# Patient Record
Sex: Female | Born: 1995 | Race: Black or African American | Hispanic: No | Marital: Single | Smoking: Current some day smoker
Health system: Southern US, Community
[De-identification: ages and names within clinical notes are randomized; demographics above are authoritative.]

## PROBLEM LIST (undated history)

## (undated) ENCOUNTER — Inpatient Hospital Stay (HOSPITAL_COMMUNITY): Payer: Self-pay

## (undated) ENCOUNTER — Ambulatory Visit: Admission: EM | Payer: Medicaid Other | Source: Home / Self Care

## (undated) DIAGNOSIS — O24419 Gestational diabetes mellitus in pregnancy, unspecified control: Secondary | ICD-10-CM

## (undated) DIAGNOSIS — K219 Gastro-esophageal reflux disease without esophagitis: Secondary | ICD-10-CM

## (undated) HISTORY — PX: WISDOM TOOTH EXTRACTION: SHX21

## (undated) HISTORY — PX: TONSILLECTOMY: SUR1361

## (undated) HISTORY — PX: ADENOIDECTOMY: SUR15

---
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
## 2013-06-10 ENCOUNTER — Emergency Department (HOSPITAL_COMMUNITY)
Admission: EM | Admit: 2013-06-10 | Discharge: 2013-06-10 | Disposition: A | Discharge status: Home / Self Care | Payer: Medicaid Other | Attending: Emergency Medicine | Admitting: Emergency Medicine

## 2013-06-10 ENCOUNTER — Encounter (HOSPITAL_COMMUNITY): Payer: Self-pay | Admitting: Emergency Medicine

## 2013-06-10 ENCOUNTER — Emergency Department (HOSPITAL_COMMUNITY): Payer: Medicaid Other

## 2013-06-10 DIAGNOSIS — Z3490 Encounter for supervision of normal pregnancy, unspecified, unspecified trimester: Secondary | ICD-10-CM

## 2013-06-10 DIAGNOSIS — M549 Dorsalgia, unspecified: Secondary | ICD-10-CM | POA: Insufficient documentation

## 2013-06-10 DIAGNOSIS — Z3201 Encounter for pregnancy test, result positive: Secondary | ICD-10-CM | POA: Insufficient documentation

## 2013-06-10 LAB — POCT I-STAT, CHEM 8
Chloride: 106 mEq/L (ref 96–112)
Creatinine, Ser: 0.9 mg/dL (ref 0.50–1.10)
HCT: 40 % (ref 36.0–46.0)
Hemoglobin: 13.6 g/dL (ref 12.0–15.0)
Potassium: 3.8 mEq/L (ref 3.5–5.1)
Sodium: 137 mEq/L (ref 135–145)
TCO2: 21 mmol/L (ref 0–100)

## 2013-06-10 LAB — URINALYSIS, ROUTINE W REFLEX MICROSCOPIC
Ketones, ur: NEGATIVE mg/dL
Nitrite: NEGATIVE
Protein, ur: NEGATIVE mg/dL
Specific Gravity, Urine: 1.018 (ref 1.005–1.030)
Urobilinogen, UA: 0.2 mg/dL (ref 0.0–1.0)
pH: 7 (ref 5.0–8.0)

## 2013-06-10 LAB — HCG, QUANTITATIVE, PREGNANCY: hCG, Beta Chain, Quant, S: 21023 m[IU]/mL — ABNORMAL HIGH (ref <5)

## 2013-06-10 LAB — URINE MICROSCOPIC-ADD ON

## 2013-06-10 MED ORDER — PRENATAL VITAMINS 28-0.8 MG PO TABS: 1.0000 | ORAL_TABLET | Freq: Every day | ORAL | Status: DC

## 2013-06-10 NOTE — ED Notes (Signed)
Pt presents to department for evaluation of lower abdominal pain. Ongoing for several weeks. 6/10 pain at the time. No nausea/vomiting. Denies fever. Pt is alert and oriented x4. Denies urinary/vaginal symptoms.

## 2013-06-10 NOTE — ED Notes (Signed)
PA at bedside discussing results 

## 2013-06-10 NOTE — ED Provider Notes (Signed)
Medical screening examination/treatment/procedure(s) were performed by non-physician practitioner and as supervising physician I was immediately available for consultation/collaboration.  EKG Interpretation   None         Richardean Canal, MD 06/10/13 2215

## 2013-06-10 NOTE — ED Notes (Signed)
Contacted ultrasound about pt wait time. Pt is second on list. Pt made aware. Pt resting comfortably in bed.

## 2013-06-10 NOTE — ED Notes (Signed)
Pt reports generalized abdominal pain x 1 month with pain worse on left lower abdomen and back. Generalized pain on palpation. Denies N/V/D, SOB.

## 2013-06-10 NOTE — ED Provider Notes (Signed)
CSN: [id number]     Arrival date & time 06/10/13  1052 History   First MD Initiated Contact with Patient 06/10/13 1132     Chief Complaint  Patient presents with  . Abdominal Pain   (Consider location/radiation/quality/duration/timing/severity/associated sxs/prior Treatment) HPI Patient with intermittent abdominal pain and back pain for the past several weeks.  Pain is crampy, intermittent, comes and goes at random, no exacerbating or palliative factors.  Pain moves around, is usually located in the left abdomen and left back, but is occasionally on the right.  Denies fevers, chills, N/V/D, constipation, bloody stool, urinary symptoms, abnormal vaginal discharge or bleeding.  LMP end of October.   History reviewed. No pertinent past medical history. History reviewed. No pertinent past surgical history. No family history on file. History  Substance Use Topics  . Smoking status: Never Smoker   . Smokeless tobacco: Not on file  . Alcohol Use: No   OB History   Grav Para Term Preterm Abortions TAB SAB Ect Mult Living                 Review of Systems  Constitutional: Negative for fever and chills.  Respiratory: Negative for cough and shortness of breath.   Cardiovascular: Negative for chest pain.  Gastrointestinal: Positive for abdominal pain. Negative for nausea, vomiting and diarrhea.  Genitourinary: Negative for dysuria, urgency, frequency, vaginal bleeding and vaginal discharge.  Musculoskeletal: Positive for back pain.    Allergies  Review of patient's allergies indicates no known allergies.  Home Medications   Current Outpatient Rx  Name  Route  Sig  Dispense  Refill  . diphenhydrAMINE (BENADRYL) 25 MG tablet   Oral   Take 1 tablet (25 mg total) by mouth every 6 (six) hours.   20 tablet   0   . doxycycline (VIBRAMYCIN) 100 MG capsule   Oral   Take 1 capsule (100 mg total) by mouth 2 (two) times daily. One po bid x 7 days   14 capsule   0    BP 136/78  Pulse  74  Temp(Src) 98.2 F (36.8 C) (Oral)  Resp 20  Ht 5\' 2"  (1.575 m)  Wt 226 lb 1.6 oz (102.558 kg)  BMI 41.34 kg/m2  SpO2 99%  LMP 04/30/2013 Physical Exam  Nursing note and vitals reviewed. Constitutional: She appears well-developed and well-nourished. No distress.  HENT:  Head: Normocephalic and atraumatic.  Neck: Neck supple.  Cardiovascular: Normal rate and regular rhythm.   Pulmonary/Chest: Effort normal and breath sounds normal. No respiratory distress. She has no wheezes. She has no rales.  Abdominal: Soft. She exhibits no distension. There is tenderness in the right lower quadrant, suprapubic area, left upper quadrant and left lower quadrant. There is no rebound and no guarding.  Neurological: She is alert.  Skin: She is not diaphoretic.    ED Course  Procedures (including critical care time) Labs Review Labs Reviewed  HCG, QUANTITATIVE, PREGNANCY - Abnormal; Notable for the following:    hCG, Beta Chain, Quant, S 21023 (*)    All other components within normal limits  URINALYSIS, ROUTINE W REFLEX MICROSCOPIC - Abnormal; Notable for the following:    APPearance CLOUDY (*)    Leukocytes, UA SMALL (*)    All other components within normal limits  URINE MICROSCOPIC-ADD ON - Abnormal; Notable for the following:    Squamous Epithelial / LPF MANY (*)    Bacteria, UA FEW (*)    All other components within normal limits  POCT  PREGNANCY, URINE - Abnormal; Notable for the following:    Preg Test, Ur POSITIVE (*)    All other components within normal limits  POCT I-STAT, CHEM 8   Imaging Review US Ob Comp Less 14 Wks  06/10/2013   CLINICAL DATA:  Abdominal pain.  Positive pregnancy test.  EXAM: OBSTETRIC <14 WK [place] AND TRANSVAGINAL OB US  TECHNIQUE: Both transabdominal and transvaginal ultrasound examinations were performed for complete evaluation of the gestation as well as the maternal uterus, adnexal regions, and pelvic cul-de-sac. Transvaginal technique was performed to  assess early pregnancy.  COMPARISON:  None.  FINDINGS: Intrauterine gestational sac: Visualized/normal in shape.  Yolk sac:  Visualized  Embryo:  Visualized  Cardiac Activity: Visualized  Heart Rate:  100 bpm  CRL:   5  mm   6 w 1d                  [place] EDC: 02/02/14  Maternal uterus/adnexae: Right-sided paraovarian cyst incidentally noted, 3.0 x 2.8 x 2.1 cm.  IMPRESSION: Intrauterine gestational sac, yolk sac, and fetal pole with cardiac activity corresponding to 6 weeks 1 day by today's crown-rump length. This is concordant with assigned gestational age by LMP 6 weeks 5 days, EDC by LMP 01/29/2014. No acute abnormality.   Electronically Signed   By: Christiana Pellant M.D.   On: 06/10/2013 14:12   US Ob Transvaginal  06/10/2013   CLINICAL DATA:  Abdominal pain.  Positive pregnancy test.  EXAM: OBSTETRIC <14 WK [place] AND TRANSVAGINAL OB US  TECHNIQUE: Both transabdominal and transvaginal ultrasound examinations were performed for complete evaluation of the gestation as well as the maternal uterus, adnexal regions, and pelvic cul-de-sac. Transvaginal technique was performed to assess early pregnancy.  COMPARISON:  None.  FINDINGS: Intrauterine gestational sac: Visualized/normal in shape.  Yolk sac:  Visualized  Embryo:  Visualized  Cardiac Activity: Visualized  Heart Rate:  100 bpm  CRL:   5  mm   6 w 1d                  [place] EDC: 02/02/14  Maternal uterus/adnexae: Right-sided paraovarian cyst incidentally noted, 3.0 x 2.8 x 2.1 cm.  IMPRESSION: Intrauterine gestational sac, yolk sac, and fetal pole with cardiac activity corresponding to 6 weeks 1 day by today's crown-rump length. This is concordant with assigned gestational age by LMP 6 weeks 5 days, EDC by LMP 01/29/2014. No acute abnormality.   Electronically Signed   By: Christiana Pellant M.D.   On: 06/10/2013 14:12    EKG Interpretation   None       MDM   1. Pregnancy     Pt with intermittent back and abdominal pain x weeks, no associated symptoms.   Pregnancy test is positive.  LMP end of October, stated while mom was in the room.  Mom asked to leave for the rest of our discussions, which patient agreed she wanted.  US shows 6 week intrauterine gestation.  UA without obvious infection, chem 8 normal.  Abdominal exam is benign. Given history and exam doubt any other acute process at this time.  Pt d/c home with OB follow up.  Pt advised to follow safe practices with pregnancy- discussed vitamins, food limitations, medication safety.  Discussed result, findings, treatment, and follow up  with patient.  Pt given return precautions.  Pt verbalizes understanding and agrees with plan.        Trixie Dredge, PA-C 06/10/13 1555

## 2013-06-16 ENCOUNTER — Telehealth: Payer: Self-pay | Admitting: *Deleted

## 2013-06-16 NOTE — Telephone Encounter (Signed)
Message left on nurse voice mail from pt's mother. She stated that she would like a prescription for prenatal vitamins to be sent to her daughter's pharmacy. Her first Ob appt is 07/08/13. I returned the call and spoke with pt's mother. She stated that [name] had been given a prescription for the vitamins at her ED visit but her insurance did not cover the cost. They have purchased OTC prenatal vitamins. I stated that any prenatal vitamin is fine and she may continue taking what she has. Pt's mother voiced understanding.

## 2013-06-18 ENCOUNTER — Telehealth: Payer: Self-pay | Admitting: *Deleted

## 2013-06-18 NOTE — Telephone Encounter (Signed)
[name] called and left a message states she wants to speak to a nurse about her daughter about getting pain medicine for stomach pain and doesn't have an appointment until January 7.   Called and spoke with [facility] and she reports pain " all over her abdomen and back"=8.  States is the same as the pain she was having when she went to the ER on 06/10/13.  Reviewed chart and [place] and discussed with Dr. Macon Large Advised patient is likely round ligament pain due to stretching and  she may take either regular strength tylenol or extra strength tylenol . Advised not to exceed reccomended dosage on bottle and call us if this does not relieve her pain. Norelle voices understanding.

## 2013-07-02 ENCOUNTER — Emergency Department (HOSPITAL_COMMUNITY)
Admission: EM | Admit: 2013-07-02 | Discharge: 2013-07-02 | Discharge status: Against Medical Advice | Payer: Medicaid Other | Attending: Emergency Medicine | Admitting: Emergency Medicine

## 2013-07-02 ENCOUNTER — Encounter (HOSPITAL_COMMUNITY): Payer: Self-pay | Admitting: Emergency Medicine

## 2013-07-02 DIAGNOSIS — O26899 Other specified pregnancy related conditions, unspecified trimester: Secondary | ICD-10-CM

## 2013-07-02 DIAGNOSIS — R109 Unspecified abdominal pain: Secondary | ICD-10-CM

## 2013-07-02 DIAGNOSIS — O9989 Other specified diseases and conditions complicating pregnancy, childbirth and the puerperium: Secondary | ICD-10-CM | POA: Insufficient documentation

## 2013-07-02 DIAGNOSIS — R1084 Generalized abdominal pain: Secondary | ICD-10-CM | POA: Insufficient documentation

## 2013-07-02 LAB — URINALYSIS, ROUTINE W REFLEX MICROSCOPIC
Bilirubin Urine: NEGATIVE
Glucose, UA: NEGATIVE mg/dL
Hgb urine dipstick: NEGATIVE
KETONES UR: 15 mg/dL — AB
LEUKOCYTES UA: NEGATIVE
NITRITE: NEGATIVE
PH: 6 (ref 5.0–8.0)
Protein, ur: NEGATIVE mg/dL
SPECIFIC GRAVITY, URINE: 1.036 — AB (ref 1.005–1.030)
Urobilinogen, UA: 0.2 mg/dL (ref 0.0–1.0)

## 2013-07-02 LAB — POCT PREGNANCY, URINE: Preg Test, Ur: POSITIVE — AB

## 2013-07-02 NOTE — ED Notes (Signed)
Pt refused to stay and left AMA, refusing to sign and refusing vitals.

## 2013-07-02 NOTE — ED Provider Notes (Signed)
CSN: [id number]     Arrival date & time 07/02/13  1914 History   First MD Initiated Contact with Patient 07/02/13 2052     Chief Complaint  Patient presents with  . Abdominal Pain   (Consider location/radiation/quality/duration/timing/severity/associated sxs/prior Treatment) HPI Comments: Patient who is currently [redacted] weeks pregnant presents today with a chief complaint of abdominal pain.  She reports that the pain has been present for the past four weeks intermittently.  Pain is generalized and has been moving.  She describes the pain as a cramping pain.  She has been taking Extra Strength Tylenol for the pain, but does not feel that it is helping.  She was evaluated for the same on 06/10/13.  She had an OB ultrasound at that time, which showed an IUP that was 6 weeks 1 day.  She reports that she has not yet followed up with OB/GYN.  She states that she has an appointment scheduled next week.  She denies any vaginal discharge or vaginal discharge.  Denies fever or chills.  Denies nausea, vomiting, or diarrhea.  Denies urinary symptoms.    The history is provided by the patient.    History reviewed. No pertinent past medical history. History reviewed. No pertinent past surgical history. History reviewed. No pertinent family history. History  Substance Use Topics  . Smoking status: Never Smoker   . Smokeless tobacco: Not on file  . Alcohol Use: No   OB History   Grav Para Term Preterm Abortions TAB SAB Ect Mult Living   1              Review of Systems  All other systems reviewed and are negative.    Allergies  Review of patient's allergies indicates no known allergies.  Home Medications   Current Outpatient Rx  Name  Route  Sig  Dispense  Refill  . acetaminophen (TYLENOL) 500 MG tablet   Oral   Take 1,000 mg by mouth every 6 (six) hours as needed for moderate pain.         . Prenatal Vit-Fe Fumarate-FA (MULTIVITAMIN-PRENATAL) 27-0.8 MG TABS tablet   Oral   Take 1 tablet by  mouth daily at 12 noon.          BP 105/66  Pulse 70  Temp(Src) 97.6 F (36.4 C) (Oral)  Resp 22  Ht 5\' 2"  (1.575 m)  Wt 223 lb 5 oz (101.294 kg)  BMI 40.83 kg/m2  SpO2 100%  LMP 04/30/2013 Physical Exam  Nursing note and vitals reviewed. Constitutional: She appears well-developed and well-nourished. No distress.  HENT:  Head: Normocephalic and atraumatic.  Mouth/Throat: Oropharynx is clear and moist.  Neck: Normal range of motion. Neck supple.  Cardiovascular: Normal rate, regular rhythm and normal heart sounds.   Pulmonary/Chest: Effort normal and breath sounds normal.  Abdominal: Soft. Bowel sounds are normal. She exhibits no distension and no mass. There is tenderness. There is no rebound and no guarding.  Mild generalized tenderness  Musculoskeletal: Normal range of motion.  Neurological: She is alert.  Skin: Skin is warm and dry.  Psychiatric: She has a normal mood and affect.    ED Course  Procedures (including critical care time) Labs Review Labs Reviewed  URINALYSIS, ROUTINE W REFLEX MICROSCOPIC - Abnormal; Notable for the following:    APPearance CLOUDY (*)    Specific Gravity, Urine 1.036 (*)    Ketones, ur 15 (*)    All other components within normal limits  POCT PREGNANCY, URINE - Abnormal;  Notable for the following:    Preg Test, Ur POSITIVE (*)    All other components within normal limits  WET PREP, GENITAL  GC/CHLAMYDIA PROBE AMP  CBC WITH DIFFERENTIAL  COMPREHENSIVE METABOLIC PANEL   Imaging Review No results found.  EKG Interpretation   None       MDM  No diagnosis found. Patient who is currently [redacted] weeks pregnant presents today with a chief complaint of generalized abdominal pain that has been intermittent for the past 4 weeks.  Pain has been moving.  On exam patient with mild generalized tenderness.  No rebound or guarding.  Labs, UA, and pelvic exam ordered.  However, patient left AMA without notifying me prior to work up.  Patient has  follow up appointment with OB/GYN scheduled for next week.      Santiago Glad, PA-C 07/04/13 1642

## 2013-07-02 NOTE — ED Notes (Signed)
Presents with generalized abdominal pain ongoing since beginning of December. Pt is approximately [redacted] weeks pregnant. Denies nausea, vomitng and diarrhea. Told to use tylenol extra strength for pain. Medication is not helping. dnies vaginal discharge. Mother states, "something has got to give, the tylenol is not helping and [facility]

## 2013-07-02 NOTE — ED Notes (Addendum)
Pts POC-Preg postive (+). RN called and made aware of same.

## 2013-07-04 NOTE — ED Provider Notes (Signed)
Medical screening examination/treatment/procedure(s) were performed by non-physician practitioner and as supervising physician I was immediately available for consultation/collaboration.  EKG Interpretation   None         David H Yao, MD 07/04/13 2257 

## 2013-07-08 ENCOUNTER — Encounter: Payer: Self-pay | Admitting: Family Medicine

## 2013-07-08 ENCOUNTER — Ambulatory Visit (INDEPENDENT_AMBULATORY_CARE_PROVIDER_SITE_OTHER): Payer: Medicaid Other | Admitting: Family Medicine

## 2013-07-08 VITALS — BP 105/74 | Temp 97.3°F | Ht 61.0 in | Wt 218.0 lb

## 2013-07-08 DIAGNOSIS — Z23 Encounter for immunization: Secondary | ICD-10-CM

## 2013-07-08 DIAGNOSIS — Z3491 Encounter for supervision of normal pregnancy, unspecified, first trimester: Secondary | ICD-10-CM

## 2013-07-08 DIAGNOSIS — O0990 Supervision of high risk pregnancy, unspecified, unspecified trimester: Secondary | ICD-10-CM | POA: Insufficient documentation

## 2013-07-08 DIAGNOSIS — Z3400 Encounter for supervision of normal first pregnancy, unspecified trimester: Secondary | ICD-10-CM

## 2013-07-08 DIAGNOSIS — Z3401 Encounter for supervision of normal first pregnancy, first trimester: Secondary | ICD-10-CM

## 2013-07-08 LAB — POCT URINALYSIS DIP (DEVICE)
Bilirubin Urine: NEGATIVE
Glucose, UA: NEGATIVE mg/dL
Hgb urine dipstick: NEGATIVE
Ketones, ur: NEGATIVE mg/dL
LEUKOCYTES UA: NEGATIVE
NITRITE: NEGATIVE
Protein, ur: NEGATIVE mg/dL
Specific Gravity, Urine: 1.02 (ref 1.005–1.030)
Urobilinogen, UA: 0.2 mg/dL (ref 0.0–1.0)
pH: 6.5 (ref 5.0–8.0)

## 2013-07-08 LAB — HIV ANTIBODY (ROUTINE TESTING W REFLEX): HIV: NONREACTIVE

## 2013-07-08 LAB — GLUCOSE TOLERANCE, 1 HOUR (50G) W/O FASTING: GLUCOSE 1 HOUR GTT: 109 mg/dL (ref 70–140)

## 2013-07-08 MED ORDER — OMEPRAZOLE 20 MG PO CPDR: 20.0000 mg | DELAYED_RELEASE_CAPSULE | Freq: Two times a day (BID) | ORAL | Status: DC

## 2013-07-08 MED ORDER — CYCLOBENZAPRINE HCL 5 MG PO TABS: 5.0000 mg | ORAL_TABLET | Freq: Three times a day (TID) | ORAL | Status: DC | PRN

## 2013-07-08 NOTE — Progress Notes (Signed)
Subjective:    Felicia Holt is a G1P0 [redacted]w[redacted]d being seen today for her first obstetrical visit.  Her obstetrical history is significant for Obesity, teen pregnancy. Patient is undedcided but considering intend to breast feed. Pregnancy history fully reviewed.  Patient reports no bleeding, no contractions, no cramping, no leaking and pt complains of back pain, epigastric pain an dlower abdominal pain. pt has pain constantly but in different locations. Sometimes improves with eating, heat, no improvement with BM or APAP. Marland Kitchen  Filed Vitals:   07/08/13 1016 07/08/13 1017  BP: 105/74   Temp: 97.3 F (36.3 C)   Height:  5\' 1"  (1.549 m)  Weight: 98.884 kg (218 lb)     HISTORY: OB History  Gravida Para Term Preterm AB SAB TAB Ectopic Multiple Living  1             # Outcome Date GA Lbr Len/2nd Weight Sex Delivery Anes PTL Lv  1 CUR              History reviewed. No pertinent past medical history. Past Surgical History  Procedure Laterality Date  . Tonsillectomy    . Wisdom tooth extraction     Family History  Problem Relation Age of Onset  . Hypertension Mother   . Hypertension Father   . Diabetes Maternal Grandmother   . COPD Maternal Grandmother      Exam    Uterus:     Pelvic Exam:    Perineum: No Hemorrhoids   Vulva: normal   Vagina:  normal mucosa   pH:    Cervix: no CMT   Adnexa: normal adnexa and no mass, fullness, tenderness   Bony Pelvis: average  System: Breast:  No complaints   Skin: normal coloration and turgor, no rashes    Neurologic: oriented, normal, negative, normal mood   Extremities: normal strength, tone, and muscle mass, no deformities   HEENT extra ocular movement intact and sclera clear, anicteric   Mouth/Teeth mucous membranes moist, pharynx normal without lesions   Neck supple and no masses   Cardiovascular: regular rate and rhythm, no murmurs or gallops   Respiratory:  appears well, vitals normal, no respiratory distress, acyanotic,  normal RR, ear and throat exam is normal, neck free of mass or lymphadenopathy, chest clear, no wheezing, crepitations, rhonchi, normal symmetric air entry   Abdomen: soft, non-tender; bowel sounds normal; no masses,  no organomegaly epigastrict tendneres, tenderness over left hip, Tenderness paraspinal left, No CVAT   Urinary: urethral meatus normal      Assessment:    Pregnancy: G1P0 There are no active problems to display for this patient.       Plan:     Initial labs drawn. Prenatal vitamins. Problem list reviewed and updated. Genetic Screening discussed Quad Screen: requested.  Ultrasound discussed; fetal survey: requested.  Follow up in 4 weeks. 50% of 20 min visit spent on counseling and coordination of care.   Felicia Holt is a 18 y.o. G1P0 at [redacted]w[redacted]d by L=6 here for ROB visit. Discussed with Patient:  - New OB lab collected - RTC for any VB, regular, painful cramps/ctxs occurring at a rate of >2/10 min, fever (100.5 or higher), n/v/d, any pain that is unresolving or worsening. - Routine precautions(SAB, depression, infection s/s) - RTC in 4 weeks for next appt.  To Do: 1. Prenatal labs 2. Will schedule  3. Flexeril for pain, stretches for pain 4. prilosec for Heart burn.  [ ]  Vaccines: Flu: 07/08/2013  Tdap:  [ ]  BCM: undecided  Edu: [x ] PTL precautions; [ ]  BF class; [ ]  childbirth class; [ ]   BF counseling;    Felicia Holt 07/08/2013

## 2013-07-08 NOTE — Progress Notes (Signed)
P=70  Here for Initial OB visit. States not sure of exact date of LMP, but knows it was end of October. Had ER visit in December for abdominal pain, and found out was pregnant. Given new patient information. Discussed bmi/ appropriate weight gain.

## 2013-07-08 NOTE — Patient Instructions (Addendum)
Pregnancy - First Trimester  Your BMI is Body mass index is 41.21 kg/(m^2).Marland Kitchen The Academy of Obstetrics and Gynecology have recommendations for weight gain during pregnancy. Below is the recommendations. Please refer to your BMI and help maintain your weight.  BMI: Body mass index is 41.21 kg/(m^2).  Table 1. Institute of Medicine Weight Gain Recommendations for Pregnancy  Prepregnancy Weight  Category Body Mass Index* Recommended  Range of  Total Weight (lb) Recommended Rates of Weight Gain? in the Second and Third Trimesters (lb) (Mean Range [lb/wk])  Underweight Less than 18.5 28-40 1 (1-1.3)  Normal Weight 18.5-24.9 25-35 1 (0.8-1)  Overweight 25-29.9 15-25 0.6 (0.5-0.7)  Obese (includes all classes) 30 and greater 11-20 0.5 (0.4-0.6)  *Body mass index is calculated as weight in kilograms divided by height in meters squared or as weight in pounds multiplied by 703 divided by height in inches.  ?Calculations assume a 1.1-4.4 lb weight gain in the first trimester. Modified from Institute of Medicine ([place]). Weight gain during pregnancy: reexamining the guidelines. [place], [place]. [employer]; 2009. 2009 National Academy of Sciences.    AAFP guidelines    During sexual intercourse, millions of sperm go into the vagina. Only 1 sperm will penetrate and fertilize the female egg while it is in the Fallopian tube. One week later, the fertilized egg implants into the wall of the uterus. An embryo begins to develop into a baby. At 6 to 8 weeks, the eyes and face are formed and the heartbeat can be seen on ultrasound. At the end of 12 weeks (first trimester), all the baby's organs are formed. Now that you are pregnant, you will want to do everything you can to have a healthy baby. Two of the most important things are to get good prenatal care and follow your caregiver's instructions. Prenatal care is all the medical care you receive before the baby's birth. It is given to  prevent, find, and treat problems during the pregnancy and childbirth. PRENATAL EXAMS  During prenatal visits, your weight, blood pressure, and urine are checked. This is done to make sure you are healthy and progressing normally during the pregnancy.  A pregnant woman should gain 25 to 35 pounds during the pregnancy. However, if you are overweight or underweight, your caregiver will advise you regarding your weight.  Your caregiver will ask and answer questions for you.  Blood work, cervical cultures, other necessary tests, and a Pap test are done during your prenatal exams. These tests are done to check on your health and the probable health of your baby. Tests are strongly recommended and done for HIV with your permission. This is the virus that causes AIDS. These tests are done because medicines can be given to help prevent your baby from being born with this infection should you have been infected without knowing it. Blood work is also used to find out your blood type, previous infections, and follow your blood levels (hemoglobin).  Low hemoglobin (anemia) is common during pregnancy. Iron and vitamins are given to help prevent this. Later in the pregnancy, blood tests for diabetes will be done along with any other tests if any problems develop.  You may need other tests to make sure you and the baby are doing well. CHANGES DURING THE FIRST TRIMESTER  Your body goes through many changes during pregnancy. They vary from person to person. Talk to your caregiver about changes you notice and are concerned about. Changes can include:  Your menstrual period  stops.  The egg and sperm carry the genes that determine what you look like. Genes from you and your partner are forming a baby. The female genes determine whether the baby is a boy or a girl.  Your body increases in girth and you may feel bloated.  Feeling sick to your stomach (nauseous) and throwing up (vomiting). If the vomiting is  uncontrollable, call your caregiver.  Your breasts will begin to enlarge and become tender.  Your nipples may stick out more and become darker.  The need to urinate more. Painful urination may mean you have a bladder infection.  Tiring easily.  Loss of appetite.  Cravings for certain kinds of food.  At first, you may gain or lose a couple of pounds.  You may have changes in your emotions from day to day (excited to be pregnant or concerned something may go wrong with the pregnancy and baby).  You may have more vivid and strange dreams. HOME CARE INSTRUCTIONS   It is very important to avoid all smoking, alcohol and non-prescribed drugs during your pregnancy. These affect the formation and growth of the baby. Avoid chemicals while pregnant to ensure the delivery of a healthy infant.  Start your prenatal visits by the 12th week of pregnancy. They are usually scheduled monthly at first, then more often in the last 2 months before delivery. Keep your caregiver's appointments. Follow your caregiver's instructions regarding medicine use, blood and lab tests, exercise, and diet.  During pregnancy, you are providing food for you and your baby. Eat regular, well-balanced meals. Choose foods such as meat, fish, milk and other low fat dairy products, vegetables, fruits, and whole-grain breads and cereals. Your caregiver will tell you of the ideal weight gain.  You can help morning sickness by keeping soda crackers at the bedside. Eat a couple before arising in the morning. You may want to use the crackers without salt on them.  Eating 4 to 5 small meals rather than 3 large meals a day also may help the nausea and vomiting.  Drinking liquids between meals instead of during meals also seems to help nausea and vomiting.  A physical sexual relationship may be continued throughout pregnancy if there are no other problems. Problems may be early (premature) leaking of amniotic fluid from the membranes,  vaginal bleeding, or belly (abdominal) pain.  Exercise regularly if there are no restrictions. Check with your caregiver or physical therapist if you are unsure of the safety of some of your exercises. Greater weight gain will occur in the last 2 trimesters of pregnancy. Exercising will help:  Control your weight.  Keep you in shape.  Prepare you for labor and delivery.  Help you lose your pregnancy weight after you deliver your baby.  Wear a good support or jogging bra for breast tenderness during pregnancy. This may help if worn during sleep too.  Ask when prenatal classes are available. Begin classes when they are offered.  Do not use hot tubs, steam rooms, or saunas.  Wear your seat belt when driving. This protects you and your baby if you are in an accident.  Avoid raw meat, uncooked cheese, cat litter boxes, and soil used by cats throughout the pregnancy. These carry germs that can cause birth defects in the baby.  The first trimester is a good time to visit your dentist for your dental health. Getting your teeth cleaned is okay. Use a softer toothbrush and brush gently during pregnancy.  Ask for help if  you have financial, counseling, or nutritional needs during pregnancy. Your caregiver will be able to offer counseling for these needs as well as refer you for other special needs.  Do not take any medicines or herbs unless told by your caregiver.  Inform your caregiver if there is any mental or physical domestic violence.  Make a list of emergency phone numbers of family, friends, hospital, and police and fire departments.  Write down your questions. Take them to your prenatal visit.  Do not douche.  Do not cross your legs.  If you have to stand for long periods of time, rotate you feet or take small steps in a circle.  You may have more vaginal secretions that may require a sanitary pad. Do not use tampons or scented sanitary pads. MEDICINES AND DRUG USE IN  PREGNANCY  Take prenatal vitamins as directed. The vitamin should contain 1 milligram of folic acid. Keep all vitamins out of reach of children. Only a couple vitamins or tablets containing iron may be fatal to a baby or young child when ingested.  Avoid use of all medicines, including herbs, over-the-counter medicines, not prescribed or suggested by your caregiver. Only take over-the-counter or prescription medicines for pain, discomfort, or fever as directed by your caregiver. Do not use aspirin, ibuprofen, or naproxen unless directed by your caregiver.  Let your caregiver also know about herbs you may be using.  Alcohol is related to a number of birth defects. This includes fetal alcohol syndrome. All alcohol, in any form, should be avoided completely. Smoking will cause low birth rate and premature babies.  Street or illegal drugs are very harmful to the baby. They are absolutely forbidden. A baby born to an addicted mother will be addicted at birth. The baby will go through the same withdrawal an adult does.  Let your caregiver know about any medicines that you have to take and for what reason you take them. SEEK MEDICAL CARE IF:  You have any concerns or worries during your pregnancy. It is better to call with your questions if you feel they cannot wait, rather than worry about them. SEEK IMMEDIATE MEDICAL CARE IF:   An unexplained oral temperature above 102 F (38.9 C) develops, or as your caregiver suggests.  You have leaking of fluid from the vagina (birth canal). If leaking membranes are suspected, take your temperature and inform your caregiver of this when you call.  There is vaginal spotting or bleeding. Notify your caregiver of the amount and how many pads are used.  You develop a bad smelling vaginal discharge with a change in the color.  You continue to feel sick to your stomach (nauseated) and have no relief from remedies suggested. You vomit blood or coffee ground-like  materials.  You lose more than 2 pounds of weight in 1 week.  You gain more than 2 pounds of weight in 1 week and you notice swelling of your face, hands, feet, or legs.  You gain 5 pounds or more in 1 week (even if you do not have swelling of your hands, face, legs, or feet).  You get exposed to [place] measles and have never had them.  You are exposed to fifth disease or chickenpox.  You develop belly (abdominal) pain. Round ligament discomfort is a common non-cancerous (benign) cause of abdominal pain in pregnancy. Your caregiver still must evaluate this.  You develop headache, fever, diarrhea, pain with urination, or shortness of breath.  You fall or are in a car  accident or have any kind of trauma.  There is mental or physical violence in your home. Document Released: 06/12/2001 Document Revised: 03/12/2012 Document Reviewed: 12/14/2008 [facility] Patient Information 2014 [place], [place]. Back Exercises These exercises may help you when beginning to rehabilitate your injury. Your symptoms may resolve with or without further involvement from your physician, physical therapist or athletic trainer. While completing these exercises, remember:   Restoring tissue flexibility helps normal motion to return to the joints. This allows healthier, less painful movement and activity.  An effective stretch should be held for at least 30 seconds.  A stretch should never be painful. You should only feel a gentle lengthening or release in the stretched tissue. STRETCH  Extension, Prone on Elbows   Lie on your stomach on the floor, a bed will be too soft. Place your palms about shoulder width apart and at the height of your head.  Place your elbows under your shoulders. If this is too painful, stack pillows under your chest.  Allow your body to relax so that your hips drop lower and make contact more completely with the floor.  Hold this position for __________ seconds.  Slowly return to lying  flat on the floor. Repeat __________ times. Complete this exercise __________ times per day.  RANGE OF MOTION  Extension, Prone Press Ups   Lie on your stomach on the floor, a bed will be too soft. Place your palms about shoulder width apart and at the height of your head.  Keeping your back as relaxed as possible, slowly straighten your elbows while keeping your hips on the floor. You may adjust the placement of your hands to maximize your comfort. As you gain motion, your hands will come more underneath your shoulders.  Hold this position __________ seconds.  Slowly return to lying flat on the floor. Repeat __________ times. Complete this exercise __________ times per day.  RANGE OF MOTION- Quadruped, Neutral Spine   Assume a hands and knees position on a firm surface. Keep your hands under your shoulders and your knees under your hips. You may place padding under your knees for comfort.  Drop your head and point your tail bone toward the ground below you. This will round out your low back like an angry cat. Hold this position for __________ seconds.  Slowly lift your head and release your tail bone so that your back sags into a large arch, like an old horse.  Hold this position for __________ seconds.  Repeat this until you feel limber in your low back.  Now, find your "sweet spot." This will be the most comfortable position somewhere between the two previous positions. This is your neutral spine. Once you have found this position, tense your stomach muscles to support your low back.  Hold this position for __________ seconds. Repeat __________ times. Complete this exercise __________ times per day.  STRETCH  Flexion, Single Knee to Chest   Lie on a firm bed or floor with both legs extended in front of you.  Keeping one leg in contact with the floor, bring your opposite knee to your chest. Hold your leg in place by either grabbing behind your thigh or at your knee.  Pull until  you feel a gentle stretch in your low back. Hold __________ seconds.  Slowly release your grasp and repeat the exercise with the opposite side. Repeat __________ times. Complete this exercise __________ times per day.  STRETCH - Hamstrings, Standing  Stand or sit and extend your  right / left leg, placing your foot on a chair or foot stool  Keeping a slight arch in your low back and your hips straight forward.  Lead with your chest and lean forward at the waist until you feel a gentle stretch in the back of your right / left knee or thigh. (When done correctly, this exercise requires leaning only a small distance.)  Hold this position for __________ seconds. Repeat __________ times. Complete this stretch __________ times per day. STRENGTHENING  Deep Abdominals, Pelvic Tilt   Lie on a firm bed or floor. Keeping your legs in front of you, bend your knees so they are both pointed toward the ceiling and your feet are flat on the floor.  Tense your lower abdominal muscles to press your low back into the floor. This motion will rotate your pelvis so that your tail bone is scooping upwards rather than pointing at your feet or into the floor.  With a gentle tension and even breathing, hold this position for __________ seconds. Repeat __________ times. Complete this exercise __________ times per day.  STRENGTHENING  Abdominals, Crunches   Lie on a firm bed or floor. Keeping your legs in front of you, bend your knees so they are both pointed toward the ceiling and your feet are flat on the floor. Cross your arms over your chest.  Slightly tip your chin down without bending your neck.  Tense your abdominals and slowly lift your trunk high enough to just clear your shoulder blades. Lifting higher can put excessive stress on the low back and does not further strengthen your abdominal muscles.  Control your return to the starting position. Repeat __________ times. Complete this exercise __________  times per day.  STRENGTHENING  Quadruped, Opposite UE/LE Lift   Assume a hands and knees position on a firm surface. Keep your hands under your shoulders and your knees under your hips. You may place padding under your knees for comfort.  Find your neutral spine and gently tense your abdominal muscles so that you can maintain this position. Your shoulders and hips should form a rectangle that is parallel with the floor and is not twisted.  Keeping your trunk steady, lift your right hand no higher than your shoulder and then your left leg no higher than your hip. Make sure you are not holding your breath. Hold this position __________ seconds.  Continuing to keep your abdominal muscles tense and your back steady, slowly return to your starting position. Repeat with the opposite arm and leg. Repeat __________ times. Complete this exercise __________ times per day. Document Released: 07/06/2005 Document Revised: 09/10/2011 Document Reviewed: 09/30/2008 [facility] Patient Information 2014 [place], [place].

## 2013-07-09 LAB — OBSTETRIC PANEL
Antibody Screen: NEGATIVE
BASOS ABS: 0 10*3/uL (ref 0.0–0.1)
Basophils Relative: 0 % (ref 0–1)
Eosinophils Absolute: 0.3 10*3/uL (ref 0.0–0.7)
Eosinophils Relative: 3 % (ref 0–5)
HCT: 34.7 % — ABNORMAL LOW (ref 36.0–46.0)
HEMOGLOBIN: 12.4 g/dL (ref 12.0–15.0)
Hepatitis B Surface Ag: NEGATIVE
Lymphocytes Relative: 22 % (ref 12–46)
Lymphs Abs: 2 10*3/uL (ref 0.7–4.0)
MCH: 28.8 pg (ref 26.0–34.0)
MCHC: 35.7 g/dL (ref 30.0–36.0)
MCV: 80.5 fL (ref 78.0–100.0)
Monocytes Absolute: 0.5 10*3/uL (ref 0.1–1.0)
Monocytes Relative: 5 % (ref 3–12)
NEUTROS ABS: 6 10*3/uL (ref 1.7–7.7)
NEUTROS PCT: 70 % (ref 43–77)
PLATELETS: 295 10*3/uL (ref 150–400)
RBC: 4.31 MIL/uL (ref 3.87–5.11)
RDW: 14 % (ref 11.5–15.5)
Rh Type: POSITIVE
Rubella: 0.56 Index (ref <0.90)
WBC: 8.7 10*3/uL (ref 4.0–10.5)

## 2013-07-09 LAB — GC/CHLAMYDIA PROBE AMP
CT PROBE, AMP APTIMA: NEGATIVE
GC Probe RNA: NEGATIVE

## 2013-07-10 LAB — PRESCRIPTION MONITORING PROFILE (19 PANEL)
AMPHETAMINE/METH: NEGATIVE ng/mL
BARBITURATE SCREEN, URINE: NEGATIVE ng/mL
BENZODIAZEPINE SCREEN, URINE: NEGATIVE ng/mL
Buprenorphine, Urine: NEGATIVE ng/mL
COCAINE METABOLITES: NEGATIVE ng/mL
Carisoprodol, Urine: NEGATIVE ng/mL
Creatinine, Urine: 216.13 mg/dL (ref >20.0)
FENTANYL URINE: NEGATIVE ng/mL
MDMA URINE: NEGATIVE ng/mL
Meperidine, Ur: NEGATIVE ng/mL
Methadone Screen, Urine: NEGATIVE ng/mL
Methaqualone: NEGATIVE ng/mL
Nitrites, Initial: NEGATIVE ug/mL
OPIATE SCREEN, URINE: NEGATIVE ng/mL
Oxycodone Screen, Ur: NEGATIVE ng/mL
PHENCYCLIDINE, UR: NEGATIVE ng/mL
Propoxyphene: NEGATIVE ng/mL
TAPENTADOLUR: NEGATIVE ng/mL
TRAMADOL UR: NEGATIVE ng/mL
Zolpidem, Urine: NEGATIVE ng/mL
pH, Initial: 6.1 pH (ref 4.5–8.9)

## 2013-07-10 LAB — HEMOGLOBINOPATHY EVALUATION
HGB F QUANT: 0 % (ref 0.0–2.0)
Hemoglobin Other: 0 %
Hgb A2 Quant: 2.9 % (ref 2.2–3.2)
Hgb A: 97.1 % (ref 96.8–97.8)
Hgb S Quant: 0 %

## 2013-07-10 LAB — CANNABANOIDS (GC/LC/MS), URINE: THC-COOH UR CONFIRM: 49 ng/mL — AB

## 2013-07-13 LAB — CULTURE, OB URINE: Colony Count: 30000

## 2013-07-15 ENCOUNTER — Telehealth: Payer: Self-pay | Admitting: Family Medicine

## 2013-07-15 NOTE — Telephone Encounter (Signed)
Note made for urine culture to be repeated at next visit.

## 2013-07-15 NOTE — Telephone Encounter (Signed)
Please reeval OB culture for enteroccus. If present on repeat, will treat.

## 2013-07-22 ENCOUNTER — Telehealth: Payer: Self-pay | Admitting: *Deleted

## 2013-07-22 DIAGNOSIS — Z3490 Encounter for supervision of normal pregnancy, unspecified, unspecified trimester: Secondary | ICD-10-CM

## 2013-07-22 MED ORDER — PRENATAL 27-0.8 MG PO TABS: 1.0000 | ORAL_TABLET | Freq: Every day | ORAL | Status: DC

## 2013-07-22 NOTE — Telephone Encounter (Signed)
Pt's mother called nurse line requesting prenatal vitamin prescription to be sent to pharmacy.

## 2013-07-22 NOTE — Telephone Encounter (Signed)
Called patient and left message that we would reorder prenatal vitamins and have sent to her pharmacy.

## 2013-07-28 ENCOUNTER — Encounter: Payer: Self-pay | Admitting: Advanced Practice Midwife

## 2013-07-28 ENCOUNTER — Encounter: Payer: Self-pay | Admitting: *Deleted

## 2013-07-28 ENCOUNTER — Ambulatory Visit (INDEPENDENT_AMBULATORY_CARE_PROVIDER_SITE_OTHER): Payer: Medicaid Other | Admitting: Obstetrics

## 2013-07-28 VITALS — BP 106/64 | Temp 98.1°F | Wt 225.0 lb

## 2013-07-28 DIAGNOSIS — Z3400 Encounter for supervision of normal first pregnancy, unspecified trimester: Secondary | ICD-10-CM

## 2013-07-28 DIAGNOSIS — Z3201 Encounter for pregnancy test, result positive: Secondary | ICD-10-CM

## 2013-07-28 LAB — POCT URINALYSIS DIPSTICK
Bilirubin, UA: NEGATIVE
Blood, UA: NEGATIVE
Glucose, UA: NEGATIVE
Ketones, UA: NEGATIVE
NITRITE UA: NEGATIVE
PH UA: 6
Spec Grav, UA: 1.015
UROBILINOGEN UA: NEGATIVE

## 2013-07-28 NOTE — Progress Notes (Signed)
Pulse 78 Subjective:    Felicia Holt is being seen today for her first obstetrical visit.  This is not a planned pregnancy. She is at 2871w0d gestation. Her obstetrical history is significant for obesity. Relationship with FOB: not together. Patient does intend to breast feed. Pregnancy history fully reviewed. [facility] Prenatal labwork was drawn. Pt would like to discuss test for any abnormalities, quad screen.  [facility]  Pt would like to discuss employment, any restrictions she may have. Pt works at [employer].   Menstrual History: OB History   Grav Para Term Preterm Abortions TAB SAB Ect Mult Living   1               Menarche age: 89  No LMP recorded. Patient is pregnant.    The following portions of the patient's history were reviewed and updated as appropriate: allergies, current medications, past family history, past medical history, past social history, past surgical history and problem list.  Review of Systems Pertinent items are noted in HPI.    Objective:    General appearance: alert and no distress Abdomen: normal findings: soft, non-tender Pelvic: cervix normal in appearance, external genitalia normal, no adnexal masses or tenderness, no cervical motion tenderness, rectovaginal septum normal, vagina normal without discharge and uterus ~ 13 weeks, soft and NT.  FHR 150bpm with doppler.    Assessment:    Pregnancy at 7171w0d weeks    Plan:    Initial labs drawn. Prenatal vitamins.  Counseling provided regarding continued use of seat belts, cessation of alcohol consumption, smoking or use of illicit drugs; infection precautions i.e., influenza/TDAP immunizations, toxoplasmosis,CMV, parvovirus, listeria and varicella; workplace safety, exercise during pregnancy; routine dental care, safe medications, sexual activity, hot tubs, saunas, pools, travel, caffeine use, fish and methlymercury, potential  toxins, hair treatments, varicose veins Weight gain recommendations per IOM guidelines reviewed: underweight/BMI< 18.5--> gain 28 - 40 lbs; normal weight/BMI 18.5 - 24.9--> gain 25 - 35 lbs; overweight/BMI 25 - 29.9--> gain 15 - 25 lbs; obese/BMI >30->gain  11 - 20 lbs Problem list reviewed and updated. FIRST/CF mutation testing/NIPT/QUAD SCREEN discussed: requested. Role of ultrasound in pregnancy discussed; fetal survey: requested. Amniocentesis discussed: not indicated. VBAC calculator score: VBAC consent form provided Follow up in 3 weeks. 50% of 20 min visit spent on counseling and coordination of care.

## 2013-07-29 ENCOUNTER — Encounter: Payer: Self-pay | Admitting: Obstetrics

## 2013-08-05 ENCOUNTER — Encounter: Payer: Medicaid Other | Admitting: Advanced Practice Midwife

## 2013-08-07 ENCOUNTER — Ambulatory Visit (INDEPENDENT_AMBULATORY_CARE_PROVIDER_SITE_OTHER): Payer: Medicaid Other | Admitting: Advanced Practice Midwife

## 2013-08-07 ENCOUNTER — Encounter: Payer: Self-pay | Admitting: Advanced Practice Midwife

## 2013-08-07 VITALS — BP 85/50 | Temp 97.3°F | Wt 229.0 lb

## 2013-08-07 DIAGNOSIS — N390 Urinary tract infection, site not specified: Secondary | ICD-10-CM

## 2013-08-07 DIAGNOSIS — Z3400 Encounter for supervision of normal first pregnancy, unspecified trimester: Secondary | ICD-10-CM

## 2013-08-07 MED ORDER — NITROFURANTOIN MONOHYD MACRO 100 MG PO CAPS
100.0000 mg | ORAL_CAPSULE | Freq: Two times a day (BID) | ORAL | Status: DC
Start: 2013-08-07 — End: 2013-09-02

## 2013-08-07 NOTE — Progress Notes (Signed)
Pulse: 80 Patient states that she had a small amount of bleeding yesterday. Patient states "she ate raw chicken at work yesterday and had bleeding an hour after". Patient would like to know if that could make her bleed. Patient needs letter for work revised.

## 2013-08-07 NOTE — Progress Notes (Signed)
Subjective: Donna ChristenJanai Brewton is a 18 y.o. at 14 weeks by 6 week US  Patient denies vaginal leaking of fluid or bleeding, denies contractions.  Reports absent fetal movment.  Patient had light bleeding yesterday while at work. She currently works for [employer]. 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 The MD note she has already submitted has not helped the situation, they have discussed moving her within the company but have not done anything about it. The patient's mother was the one verbalizing the concerns and is asking for a absence request from work.   Small amount of bleeding yesterday, not currently bleeding. Denies N/V/D.   Was never given a Rx for UTI. Objective: Filed Vitals:   08/07/13 1011  BP: 85/50  Temp: 97.3 F (36.3 C)   150 FHR SP Fundal Height Fetal Position unknown  Assessment: Patient Active Problem List   Diagnosis Date Noted  . Supervision of low-risk first pregnancy 07/08/2013  UTI High physical demands at work  Plan: Patient to return to clinic in 3 weeks Patient to bring in [employer] paperwork for [employer] to review and consider plan for work.  Patient stable w/ +FHR today. Blood type AB+. Reviewed warning signs and concerns w/ bleeding in pregnancy. Gave note for patient excused absence from work until Monday. Will treat + urine culture from 1/7 today. Patient to take full course of antibiotic. Reviewed warning signs in pregnancy. Patient to call with concerns PRN. Reviewed triage location.  20 min spent with patient greater than 80% spent in counseling and coordination of care.   Javen Ridings Wilson SingerWren CNM

## 2013-08-10 LAB — AFP, QUAD SCREEN
AFP: 26.3 [IU]/mL
CURR GEST AGE: 14.3 wks.days
Down Syndrome Scr Risk Est: 1:19400 {titer}
HCG, Total: 22772 m[IU]/mL
INH: 255.3 pg/mL
Interpretation-AFP: NEGATIVE
MOM FOR AFP: 1.25
MOM FOR HCG: 0.85
MoM for INH: 1.46
Open Spina bifida: NEGATIVE
Osb Risk: 1:11800 {titer}
TRI 18 SCR RISK EST: NEGATIVE
UE3 VALUE: 0.3 ng/mL
uE3 Mom: 1.34

## 2013-08-20 ENCOUNTER — Encounter: Payer: Medicaid Other | Admitting: Obstetrics & Gynecology

## 2013-08-26 ENCOUNTER — Encounter: Payer: Medicaid Other | Admitting: Obstetrics & Gynecology

## 2013-09-02 ENCOUNTER — Inpatient Hospital Stay (HOSPITAL_COMMUNITY)
Admission: AD | Admit: 2013-09-02 | Discharge: 2013-09-02 | Disposition: A | Discharge status: Home / Self Care | Payer: Medicaid Other | Source: Ambulatory Visit | Attending: Obstetrics & Gynecology | Admitting: Obstetrics & Gynecology

## 2013-09-02 ENCOUNTER — Inpatient Hospital Stay (HOSPITAL_COMMUNITY): Payer: Medicaid Other

## 2013-09-02 ENCOUNTER — Ambulatory Visit (INDEPENDENT_AMBULATORY_CARE_PROVIDER_SITE_OTHER): Payer: Medicaid Other | Admitting: Obstetrics & Gynecology

## 2013-09-02 ENCOUNTER — Encounter (HOSPITAL_COMMUNITY): Payer: Self-pay | Admitting: *Deleted

## 2013-09-02 VITALS — BP 112/70 | Temp 97.6°F | Wt 236.0 lb

## 2013-09-02 DIAGNOSIS — O26899 Other specified pregnancy related conditions, unspecified trimester: Secondary | ICD-10-CM

## 2013-09-02 DIAGNOSIS — O99891 Other specified diseases and conditions complicating pregnancy: Secondary | ICD-10-CM | POA: Insufficient documentation

## 2013-09-02 DIAGNOSIS — Y9289 Other specified places as the place of occurrence of the external cause: Secondary | ICD-10-CM | POA: Insufficient documentation

## 2013-09-02 DIAGNOSIS — O9989 Other specified diseases and conditions complicating pregnancy, childbirth and the puerperium: Secondary | ICD-10-CM

## 2013-09-02 DIAGNOSIS — R109 Unspecified abdominal pain: Secondary | ICD-10-CM | POA: Insufficient documentation

## 2013-09-02 DIAGNOSIS — Z3400 Encounter for supervision of normal first pregnancy, unspecified trimester: Secondary | ICD-10-CM

## 2013-09-02 DIAGNOSIS — W1809XA Striking against other object with subsequent fall, initial encounter: Secondary | ICD-10-CM | POA: Insufficient documentation

## 2013-09-02 NOTE — Progress Notes (Signed)
Pulse: 92 Patient states she is still having pains in her upper abdomen. Patient states she takes tylenol. Patient states sometimes the tylenol helps and sometimes it doesn't. Patient states sometimes the tylenol puts her to sleep. Patient states she would like to know her lab results.

## 2013-09-02 NOTE — MAU Note (Signed)
Patient telling someone on the phone that she "fell on her stomach 2 times and it hurts." Asking person on the phone to come and get her.

## 2013-09-02 NOTE — Progress Notes (Signed)
Patient a little calmer now. Speaking with Mayer CamelH. Neese, NP.
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
## 2013-09-02 NOTE — Progress Notes (Signed)
Patient left office without being seen.
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
## 2013-09-02 NOTE — Discharge Instructions (Signed)
Injuries in Pregnancy °Injuries can happen during pregnancy. Minor falls and accidents usually do not harm the mother or unborn baby. However, any injury should be reported to your doctor. °HOME CARE °· Do not take aspirin. It can make any bleeding you may have worse. °· Put ice on the injured area. °· Put ice in a plastic bag. °· Place a towel between your skin and the bag. °· Leave the ice on for 15-20 minutes, 03-04 times a day. °· Put warm packs on the injured area after 24 hours if told by your doctor. °· Have someone care for you and help you if needed. °· Do not wear high heels while pregnant. °· Remove rugs and loose objects on the floor. °· Avoid fire or starting fires. °· Avoid lifting heavy pots of boiling liquid. °GET HELP RIGHT AWAY IF: °· You have been a victim of domestic violence. °· You have been in a car accident. °· You have more pain in any part of the body. °· You have bleeding from your vagina. °· Fluid is leaking from your vagina. °· You start to have belly cramping (contractions) or pain. °· You have a stiff neck or neck pain. °· You feel weak or pass out (faint). °· You start to throw up (vomit) after an injury. °· You have been burned. °· You get a headache or have vision problems after an injury. °· You do not feel the baby move or the baby is not moving as much as normal. °MAKE SURE YOU: °· Understand these instructions. °· Will watch your condition. °· Will get help right away if you are not doing well or get worse. °Document Released: 07/21/2010 Document Revised: 09/10/2011 Document Reviewed: 03/25/2013 °ExitCare® Patient Information ©2014 [employer]. ° °

## 2013-09-02 NOTE — MAU Provider Note (Signed)
CSN: [id number]     Arrival date & time 09/02/13  1043 History   None    Chief Complaint  Patient presents with  . Abdominal Pain  . Panic Attack     (Consider location/radiation/quality/duration/timing/severity/associated sxs/prior Treatment) HPI Felicia Holt is a 18 y.o. @[redacted]w[redacted]d  gestation who presents to the MAU with complaint of abdominal pain. The patent was at the [facility] office at [facility]. She decided she did not want to stay for her visit because she was tired of going for OB visits. Her mother was with her and was going to leave the patient for her appointment and return to pick her up. The patient left and went and got in her mother's car. She was in an argument with her mother and her mother called the police to get the patient out of her car. The police were trying to get the patient out and when they did was trying to put hand cuff on her. She states that she fell on the ground and hit the left side of her stomach. Her mother was driving and left the patient. The police officer called 911 and the patient was brought to MAU. She continues to complain of pain in the left side of her abdomen.    History reviewed. No pertinent past medical history. Past Surgical History  Procedure Laterality Date  . Tonsillectomy    . Wisdom tooth extraction     Family History  Problem Relation Age of Onset  . Hypertension Mother   . Hypertension Father   . Diabetes Maternal Grandmother   . COPD Maternal Grandmother    History  Substance Use Topics  . Smoking status: Never Smoker   . Smokeless tobacco: Never Used  . Alcohol Use: No   OB History   Grav Para Term Preterm Abortions TAB SAB Ect Mult Living   1              Review of Systems  Constitutional: Negative for fever and chills.  HENT: Negative.   Eyes: Negative for visual disturbance.  Respiratory: Negative for cough and shortness of breath.   Cardiovascular: Negative for chest pain.  Gastrointestinal: Positive for abdominal pain.  Negative for nausea and vomiting.  Genitourinary: Negative for dysuria, vaginal bleeding and vaginal discharge.  Musculoskeletal: Negative for back pain.  Skin: Negative for wound.  Neurological: Negative for headaches.      Allergies  Review of patient's allergies indicates no known allergies.  Home Medications  No current outpatient prescriptions on file. BP 135/90  Pulse 120  Temp(Src) 98.2 F (36.8 C) (Oral)  Resp 24  SpO2 100% Physical Exam  Nursing note and vitals reviewed. Constitutional: She is oriented to person, place, and time. She appears well-developed and well-nourished.  HENT:  Head: Normocephalic.  Eyes: Conjunctivae and EOM are normal.  Neck: Neck supple.  Cardiovascular: Tachycardia present.   Pulmonary/Chest: Effort normal.  Abdominal: Soft. There is tenderness in the left lower quadrant.  Tenderness is mild.  Genitourinary:  External genitalia without lesions, White discharge vaginal vault. Cervix closed, long, uterus consistent with dates.   Musculoskeletal: Normal range of motion.  Neurological: She is alert and oriented to person, place, and time. No cranial nerve deficit.  Skin: Skin is warm and dry.  Psychiatric: She has a normal mood and affect. Her behavior is normal.    ED Course  Procedures  MDM  18 y.o. female with abdominal pain s/p fall. Awaiting results of ultrasound. Care turned over to Joseph Berkshire,  PA. @ 1326.   1330 - [place] results pending. [facility]    A: SIUP at 6182w1d Abdominal trauma in pregnancy  P: Discharge home Bleeding precautions discussed Patient advised to follow-up with Dr. Tamela OddiJackson-Moore for her routine prenatal care Patient may return to MAU as needed or if her condition were to change or worsen  Freddi StarrJulie N Ethier, PA-C 09/02/2013 1:54 PM

## 2013-09-07 ENCOUNTER — Other Ambulatory Visit: Payer: Self-pay | Admitting: *Deleted

## 2013-09-07 ENCOUNTER — Ambulatory Visit (INDEPENDENT_AMBULATORY_CARE_PROVIDER_SITE_OTHER): Payer: Medicaid Other | Admitting: Obstetrics & Gynecology

## 2013-09-07 VITALS — BP 113/61 | Temp 98.8°F | Wt 238.0 lb

## 2013-09-07 DIAGNOSIS — Z3400 Encounter for supervision of normal first pregnancy, unspecified trimester: Secondary | ICD-10-CM

## 2013-09-07 DIAGNOSIS — O0990 Supervision of high risk pregnancy, unspecified, unspecified trimester: Secondary | ICD-10-CM

## 2013-09-07 LAB — POCT URINALYSIS DIPSTICK
BILIRUBIN UA: NEGATIVE
Glucose, UA: NEGATIVE
Ketones, UA: NEGATIVE
Leukocytes, UA: NEGATIVE
Nitrite, UA: NEGATIVE
PH UA: 6
PROTEIN UA: NEGATIVE
RBC UA: NEGATIVE
Spec Grav, UA: 1.015
Urobilinogen, UA: NEGATIVE

## 2013-09-07 NOTE — Progress Notes (Signed)
Pulse: 87 Patient denies any concerns. Patient denies any anxiety or depression. No symptoms c/w OSA.  Cautioned re: excessive weight gain.

## 2013-09-07 NOTE — Patient Instructions (Signed)
Second Trimester of Pregnancy °The second trimester is from week 13 through week 28, month 4 through 6. This is often the time in pregnancy that you feel your best. Often times, morning sickness has lessened or quit. You may have more energy, and you may get hungry more often. Your unborn baby (fetus) is growing rapidly. At the end of the sixth month, he or she is about 9 inches long and weighs about 1½ pounds. You will likely feel the baby move (quickening) between 18 and 20 weeks of pregnancy. °HOME CARE  °· Avoid all smoking, herbs, and alcohol. Avoid drugs not approved by your doctor. °· Only take medicine as told by your doctor. Some medicines are safe and some are not during pregnancy. °· Exercise only as told by your doctor. Stop exercising if you start having cramps. °· Eat regular, healthy meals. °· Wear a good support bra if your breasts are tender. °· Do not use hot tubs, steam rooms, or saunas. °· Wear your seat belt when driving. °· Avoid raw meat, uncooked cheese, and liter boxes and soil used by cats. °· Take your prenatal vitamins. °· Try taking medicine that helps you poop (stool softener) as needed, and if your doctor approves. Eat more fiber by eating fresh fruit, vegetables, and whole grains. Drink enough fluids to keep your pee (urine) clear or pale yellow. °· Take warm water baths (sitz baths) to soothe pain or discomfort caused by hemorrhoids. Use hemorrhoid cream if your doctor approves. °· If you have puffy, bulging veins (varicose veins), wear support hose. Raise (elevate) your feet for 15 minutes, 3 4 times a day. Limit salt in your diet. °· Avoid heavy lifting, wear low heals, and sit up straight. °· Rest with your legs raised if you have leg cramps or low back pain. °· Visit your dentist if you have not gone during your pregnancy. Use a soft toothbrush to brush your teeth. Be gentle when you floss. °· You can have sex (intercourse) unless your doctor tells you not to. °· Go to your  doctor visits. °GET HELP IF:  °· You feel dizzy. °· You have mild cramps or pressure in your lower belly (abdomen). °· You have a nagging pain in your belly area. °· You continue to feel sick to your stomach (nauseous), throw up (vomit), or have watery poop (diarrhea). °· You have bad smelling fluid coming from your vagina. °· You have pain with peeing (urination). °GET HELP RIGHT AWAY IF:  °· You have a fever. °· You are leaking fluid from your vagina. °· You have spotting or bleeding from your vagina. °· You have severe belly cramping or pain. °· You lose or gain weight rapidly. °· You have trouble catching your breath and have chest pain. °· You notice sudden or extreme puffiness (swelling) of your face, hands, ankles, feet, or legs. °· You have not felt the baby move in over an hour. °· You have severe headaches that do not go away with medicine. °· You have vision changes. °Document Released: 09/12/2009 Document Revised: 10/13/2012 Document Reviewed: 08/19/2012 °ExitCare® Patient Information ©2014 Exit[employer] LLC. ° °

## 2013-09-16 ENCOUNTER — Ambulatory Visit (HOSPITAL_COMMUNITY)
Admission: RE | Admit: 2013-09-16 | Discharge: 2013-09-16 | Disposition: A | Discharge status: Home / Self Care | Payer: Medicaid Other | Source: Ambulatory Visit | Attending: Obstetrics & Gynecology | Admitting: Obstetrics & Gynecology

## 2013-09-16 DIAGNOSIS — E669 Obesity, unspecified: Secondary | ICD-10-CM | POA: Insufficient documentation

## 2013-09-16 DIAGNOSIS — Z363 Encounter for antenatal screening for malformations: Secondary | ICD-10-CM | POA: Insufficient documentation

## 2013-09-16 DIAGNOSIS — Z1389 Encounter for screening for other disorder: Secondary | ICD-10-CM | POA: Insufficient documentation

## 2013-09-16 DIAGNOSIS — O358XX0 Maternal care for other (suspected) fetal abnormality and damage, not applicable or unspecified: Secondary | ICD-10-CM | POA: Insufficient documentation

## 2013-09-16 DIAGNOSIS — O0990 Supervision of high risk pregnancy, unspecified, unspecified trimester: Secondary | ICD-10-CM

## 2013-09-16 DIAGNOSIS — O99210 Obesity complicating pregnancy, unspecified trimester: Secondary | ICD-10-CM

## 2013-09-21 ENCOUNTER — Other Ambulatory Visit: Payer: Self-pay | Admitting: *Deleted

## 2013-09-21 DIAGNOSIS — O365990 Maternal care for other known or suspected poor fetal growth, unspecified trimester, not applicable or unspecified: Secondary | ICD-10-CM

## 2013-09-25 ENCOUNTER — Encounter: Payer: Medicaid Other | Admitting: Obstetrics & Gynecology

## 2013-10-12 ENCOUNTER — Ambulatory Visit (INDEPENDENT_AMBULATORY_CARE_PROVIDER_SITE_OTHER): Payer: Medicaid Other | Admitting: Obstetrics & Gynecology

## 2013-10-12 ENCOUNTER — Encounter: Payer: Medicaid Other | Admitting: Obstetrics & Gynecology

## 2013-10-12 VITALS — BP 120/61 | Temp 97.4°F | Wt 242.0 lb

## 2013-10-12 DIAGNOSIS — E669 Obesity, unspecified: Secondary | ICD-10-CM

## 2013-10-12 DIAGNOSIS — Z3400 Encounter for supervision of normal first pregnancy, unspecified trimester: Secondary | ICD-10-CM

## 2013-10-12 DIAGNOSIS — O99210 Obesity complicating pregnancy, unspecified trimester: Secondary | ICD-10-CM

## 2013-10-12 LAB — POCT URINALYSIS DIPSTICK
Bilirubin, UA: NEGATIVE
GLUCOSE UA: NEGATIVE
KETONES UA: NEGATIVE
Leukocytes, UA: NEGATIVE
Nitrite, UA: NEGATIVE
PH UA: 6
Protein, UA: NEGATIVE
RBC UA: NEGATIVE
SPEC GRAV UA: 1.02
Urobilinogen, UA: NEGATIVE

## 2013-10-12 MED ORDER — PRENATE MINI 18-0.6-0.4-350 MG PO CAPS: 1.0000 | ORAL_CAPSULE | Freq: Every day | ORAL | Status: DC

## 2013-10-12 NOTE — Progress Notes (Signed)
Pulse 99, patient states no concerns.  2 hr GTT.

## 2013-10-12 NOTE — Patient Instructions (Signed)
Glucose Tolerance Test This is a test to see how your body processes carbohydrates. This test is often done to check patients for diabetes or the possibility of developing it. PREPARATION FOR TEST You should have nothing to eat or drink 12 hours before the test. You will be given a form of sugar (glucose) and then blood samples will be drawn from your vein to determine the level of sugar in your blood. Alternatively, blood may be drawn from your finger for testing. You should not smoke or exercise during the test. NORMAL FINDINGS  Fasting: 70-115 mg/dL  30 minutes: less than 200 mg/dL  1 hour: less than 200 mg/dL  2 hours: less than 140 mg/dL  3 hours: 70-115 mg/dL  4 hours: 70-115 mg/dL Ranges for normal findings may vary among different laboratories and hospitals. You should always check with your doctor after having lab work or other tests done to discuss the meaning of your test results and whether your values are considered within normal limits. MEANING OF TEST Your caregiver will go over the test results with you and discuss the importance and meaning of your results, as well as treatment options and the need for additional tests. OBTAINING THE TEST RESULTS It is your responsibility to obtain your test results. Ask the lab or department performing the test when and how you will get your results. Document Released: 07/11/2004 Document Revised: 09/10/2011 Document Reviewed: 05/29/2008 ExitCare Patient Information 2014 [employer]Care, LLC.  

## 2013-10-20 ENCOUNTER — Ambulatory Visit (HOSPITAL_COMMUNITY)
Admission: RE | Admit: 2013-10-20 | Discharge: 2013-10-20 | Disposition: A | Discharge status: Home / Self Care | Payer: Medicaid Other | Source: Ambulatory Visit | Attending: Obstetrics & Gynecology | Admitting: Obstetrics & Gynecology

## 2013-10-20 DIAGNOSIS — Z1389 Encounter for screening for other disorder: Secondary | ICD-10-CM | POA: Insufficient documentation

## 2013-10-20 DIAGNOSIS — O358XX0 Maternal care for other (suspected) fetal abnormality and damage, not applicable or unspecified: Secondary | ICD-10-CM | POA: Insufficient documentation

## 2013-10-20 DIAGNOSIS — Z363 Encounter for antenatal screening for malformations: Secondary | ICD-10-CM | POA: Insufficient documentation

## 2013-10-20 DIAGNOSIS — O365990 Maternal care for other known or suspected poor fetal growth, unspecified trimester, not applicable or unspecified: Secondary | ICD-10-CM

## 2013-11-09 ENCOUNTER — Ambulatory Visit (INDEPENDENT_AMBULATORY_CARE_PROVIDER_SITE_OTHER): Payer: Medicaid Other | Admitting: Obstetrics

## 2013-11-09 ENCOUNTER — Other Ambulatory Visit: Payer: Medicaid Other

## 2013-11-09 VITALS — BP 139/73 | HR 84 | Temp 97.9°F | Wt 242.0 lb

## 2013-11-09 DIAGNOSIS — Z3400 Encounter for supervision of normal first pregnancy, unspecified trimester: Secondary | ICD-10-CM

## 2013-11-09 LAB — CBC
HCT: 31.8 % — ABNORMAL LOW (ref 36.0–46.0)
Hemoglobin: 10.9 g/dL — ABNORMAL LOW (ref 12.0–15.0)
MCH: 28.3 pg (ref 26.0–34.0)
MCHC: 34.3 g/dL (ref 30.0–36.0)
MCV: 82.6 fL (ref 78.0–100.0)
Platelets: 259 10*3/uL (ref 150–400)
RBC: 3.85 MIL/uL — AB (ref 3.87–5.11)
RDW: 13.7 % (ref 11.5–15.5)
WBC: 10.9 10*3/uL — ABNORMAL HIGH (ref 4.0–10.5)

## 2013-11-10 ENCOUNTER — Encounter: Payer: Self-pay | Admitting: Obstetrics

## 2013-11-10 LAB — POCT URINALYSIS DIPSTICK
Blood, UA: NEGATIVE
GLUCOSE UA: NEGATIVE
Ketones, UA: NEGATIVE
Leukocytes, UA: NEGATIVE
Nitrite, UA: POSITIVE
Protein, UA: NEGATIVE
Spec Grav, UA: 1.01
pH, UA: 7

## 2013-11-10 LAB — GLUCOSE TOLERANCE, 2 HOURS W/ 1HR
GLUCOSE, 2 HOUR: 155 mg/dL — AB (ref 70–139)
GLUCOSE, FASTING: 80 mg/dL (ref 70–99)
GLUCOSE: 190 mg/dL — AB (ref 70–170)

## 2013-11-10 LAB — URINE CULTURE: Colony Count: >=100000

## 2013-11-10 LAB — HIV ANTIBODY (ROUTINE TESTING W REFLEX): HIV: NONREACTIVE

## 2013-11-10 LAB — RPR

## 2013-11-10 NOTE — Progress Notes (Signed)
Subjective:    Felicia ChristenJanai Holt is a 18 y.o. female being seen today for her obstetrical visit. She is at 8153w0d gestation. Patient reports no complaints. Fetal movement: normal.  Problem List Items Addressed This Visit   None    Visit Diagnoses   Supervision of normal first pregnancy    -  Primary    Relevant Orders       POCT urinalysis dipstick       Urine culture      Patient Active Problem List   Diagnosis Date Noted  . Obesity complicating pregnancy 10/12/2013  . Supervision of low-risk first pregnancy 07/08/2013   Objective:    BP 139/73  Pulse 84  Temp(Src) 97.9 F (36.6 C)  Wt 242 lb (109.77 kg) FHT:  160 BPM  Uterine Size: size equals dates  Presentation: unsure     Assessment:    Pregnancy @ 4853w0d weeks   Plan:     labs reviewed, problem list updated Consent signed. GBS sent TDAP offered  Rhogam given for RH negative Pediatrician: discussed. Infant feeding: plans to breastfeed. Maternity leave: not discussed. Cigarette smoking: never smoked. Orders Placed This Encounter  Procedures  . Urine culture  . POCT urinalysis dipstick   No orders of the defined types were placed in this encounter.   Follow up in 2 Weeks.

## 2013-11-15 ENCOUNTER — Encounter: Payer: Self-pay | Admitting: Obstetrics & Gynecology

## 2013-11-15 DIAGNOSIS — O24419 Gestational diabetes mellitus in pregnancy, unspecified control: Secondary | ICD-10-CM | POA: Insufficient documentation

## 2013-11-25 ENCOUNTER — Encounter (HOSPITAL_COMMUNITY): Payer: Self-pay | Admitting: Emergency Medicine

## 2013-11-25 ENCOUNTER — Emergency Department (HOSPITAL_COMMUNITY)
Admission: EM | Admit: 2013-11-25 | Discharge: 2013-11-25 | Disposition: A | Discharge status: Home / Self Care | Payer: Medicaid Other | Attending: Emergency Medicine | Admitting: Emergency Medicine

## 2013-11-25 ENCOUNTER — Encounter: Payer: Self-pay | Admitting: Obstetrics

## 2013-11-25 ENCOUNTER — Ambulatory Visit (INDEPENDENT_AMBULATORY_CARE_PROVIDER_SITE_OTHER): Payer: Medicaid Other | Admitting: Obstetrics

## 2013-11-25 VITALS — BP 117/78 | HR 98 | Temp 97.9°F | Wt 242.0 lb

## 2013-11-25 DIAGNOSIS — Z008 Encounter for other general examination: Secondary | ICD-10-CM | POA: Insufficient documentation

## 2013-11-25 DIAGNOSIS — F39 Unspecified mood [affective] disorder: Secondary | ICD-10-CM | POA: Insufficient documentation

## 2013-11-25 DIAGNOSIS — F32A Depression, unspecified: Secondary | ICD-10-CM | POA: Diagnosis present

## 2013-11-25 DIAGNOSIS — Z79899 Other long term (current) drug therapy: Secondary | ICD-10-CM | POA: Insufficient documentation

## 2013-11-25 DIAGNOSIS — F3289 Other specified depressive episodes: Secondary | ICD-10-CM

## 2013-11-25 DIAGNOSIS — F329 Major depressive disorder, single episode, unspecified: Secondary | ICD-10-CM

## 2013-11-25 DIAGNOSIS — O99340 Other mental disorders complicating pregnancy, unspecified trimester: Secondary | ICD-10-CM | POA: Insufficient documentation

## 2013-11-25 DIAGNOSIS — Z3400 Encounter for supervision of normal first pregnancy, unspecified trimester: Secondary | ICD-10-CM

## 2013-11-25 LAB — ACETAMINOPHEN LEVEL: Acetaminophen (Tylenol), Serum: <15 ug/mL (ref 10–30)

## 2013-11-25 LAB — COMPREHENSIVE METABOLIC PANEL
ALT: 20 U/L (ref 0–35)
AST: 17 U/L (ref 0–37)
Albumin: 2.8 g/dL — ABNORMAL LOW (ref 3.5–5.2)
Alkaline Phosphatase: 86 U/L (ref 39–117)
BUN: 10 mg/dL (ref 6–23)
CHLORIDE: 97 meq/L (ref 96–112)
CO2: 22 mEq/L (ref 19–32)
Calcium: 9.5 mg/dL (ref 8.4–10.5)
Creatinine, Ser: 0.59 mg/dL (ref 0.50–1.10)
GLUCOSE: 208 mg/dL — AB (ref 70–99)
POTASSIUM: 4.4 meq/L (ref 3.7–5.3)
Sodium: 134 mEq/L — ABNORMAL LOW (ref 137–147)
Total Bilirubin: <0.2 mg/dL — ABNORMAL LOW (ref 0.3–1.2)
Total Protein: 7.3 g/dL (ref 6.0–8.3)

## 2013-11-25 LAB — RAPID URINE DRUG SCREEN, HOSP PERFORMED
Amphetamines: NOT DETECTED
BARBITURATES: NOT DETECTED
Benzodiazepines: NOT DETECTED
Cocaine: NOT DETECTED
Opiates: NOT DETECTED
Tetrahydrocannabinol: NOT DETECTED

## 2013-11-25 LAB — POCT URINALYSIS DIPSTICK
Bilirubin, UA: NEGATIVE
Blood, UA: NEGATIVE
KETONES UA: NEGATIVE
Nitrite, UA: NEGATIVE
PROTEIN UA: NEGATIVE
SPEC GRAV UA: 1.015
Urobilinogen, UA: NEGATIVE
pH, UA: 6

## 2013-11-25 LAB — CBC
HEMATOCRIT: 34.1 % — AB (ref 36.0–46.0)
HEMOGLOBIN: 11.6 g/dL — AB (ref 12.0–15.0)
MCH: 28.2 pg (ref 26.0–34.0)
MCHC: 34 g/dL (ref 30.0–36.0)
MCV: 82.8 fL (ref 78.0–100.0)
Platelets: 246 10*3/uL (ref 150–400)
RBC: 4.12 MIL/uL (ref 3.87–5.11)
RDW: 12.3 % (ref 11.5–15.5)
WBC: 13.8 10*3/uL — AB (ref 4.0–10.5)

## 2013-11-25 LAB — ETHANOL: Alcohol, Ethyl (B): <11 mg/dL (ref 0–11)

## 2013-11-25 LAB — SALICYLATE LEVEL: Salicylate Lvl: <2 mg/dL — ABNORMAL LOW (ref 2.8–20.0)

## 2013-11-25 MED ORDER — ONDANSETRON HCL 4 MG PO TABS: 4.0000 mg | ORAL_TABLET | Freq: Three times a day (TID) | ORAL | Status: DC | PRN

## 2013-11-25 MED ORDER — ACETAMINOPHEN 325 MG PO TABS: 650.0000 mg | ORAL_TABLET | ORAL | Status: DC | PRN

## 2013-11-25 NOTE — ED Notes (Signed)
Pt changed into blue scrubs.  Pt wanded by security.  All belongings given to patient's mother to take home.

## 2013-11-25 NOTE — ED Notes (Signed)
Pt aaox3.  Pt calm and cooperative.  Pt flat affect.  Pt denies SI/HI/AH/VH.  Pt reports new onset SI thoughts since pregnancy.  Pt reports having thoughts to hurt herself with different plans.  Pt does not communicate SI plans to staff.  Pt guarded at this time.  Will continue to monitor.

## 2013-11-25 NOTE — Discharge Instructions (Signed)

## 2013-11-25 NOTE — BHH Suicide Risk Assessment (Cosign Needed)
Suicide Risk Assessment  Discharge Assessment     Demographic Factors:  Female  Total Time spent with patient: 15 minutes  Psychiatric Specialty Exam:     Blood pressure 103/55, pulse 100, resp. rate 18, SpO2 100.00%.There is no weight on file to calculate BMI.  General Appearance: Casual  Eye Contact::  Good  Speech:  Clear and Coherent and Normal Rate  Volume:  Decreased  Mood:  Depressed  Affect:  Congruent  Thought Process:  Circumstantial, Coherent, Goal Directed and "I don't want to hurt myself or baby"  Orientation:  Full (Time, Place, and Person)  Thought Content:  "I don't want to hurt myself or baby"  Suicidal Thoughts:  No  Homicidal Thoughts:  No  Memory:  Immediate;   Good Recent;   Good Remote;   Good  Judgement:  Intact  Insight:  Present  Psychomotor Activity:  Normal  Concentration:  Good  Recall:  Good  Fund of Knowledge:Good  Language: Good  Akathisia:  No  Handed:  Right  AIMS (if indicated):     Assets:  Communication Skills Desire for Improvement Housing Social Support  Sleep:       Musculoskeletal: Strength & Muscle Tone: within normal limits Gait & Station: normal Patient leans: N/A   Mental Status Per Nursing Assessment::   On Admission:     Current Mental Status by Physician: Patient denies suicidal/homicidal, psychosis and paranoia  Loss Factors: NA  Historical Factors: NA  Risk Reduction Factors:   Pregnancy, Sense of responsibility to family, Living with another person, especially a relative and Positive social support  Continued Clinical Symptoms:  None noted  Cognitive Features That Contribute To Risk:  None noted    Suicide Risk:  Minimal: No identifiable suicidal ideation.  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 No pertinent past medical history. AXIS IV:  other psychosocial or environmental problems AXIS V:  61-70 mild symptoms  Plan Of Care/Follow-up recommendations:  Activity:  Resume usual activity Diet:  Resume usual diet  Is patient on multiple antipsychotic therapies at discharge:  No   Has Patient had three or more failed trials of antipsychotic monotherapy by history:  No  Recommended Plan for Multiple Antipsychotic Therapies: NA    Lionel Woodberry, FNP-BC 11/25/2013, 4:10 PM

## 2013-11-25 NOTE — Progress Notes (Signed)
Subjective:    Felicia Holt is a 18 y.o. female being seen today for her obstetrical visit. She is at [redacted]w[redacted]d gestation. Patient reports no complaints. Fetal movement: normal.  Problem List Items Addressed This Visit   None    Visit Diagnoses   Supervision of normal first pregnancy    -  Primary    Relevant Orders       POCT urinalysis dipstick (Completed)      Patient Active Problem List   Diagnosis Date Noted  . Depression 11/25/2013  . Gestational diabetes 11/15/2013  . Obesity complicating pregnancy 10/12/2013  . Supervision of low-risk first pregnancy 07/08/2013   Objective:    BP 117/78  Pulse 98  Temp(Src) 97.9 F (36.6 C)  Wt 242 lb (109.77 kg) FHT:  160 BPM  Uterine Size: size equals dates  Presentation: unsure     Assessment:    Pregnancy @ [redacted]w[redacted]d weeks   Plan:     labs reviewed, problem list updated Consent signed. GBS sent TDAP offered  Rhogam given for RH negative Pediatrician: discussed. Infant feeding: plans to breastfeed. Maternity leave: not discussed. Cigarette smoking: never smoked. Orders Placed This Encounter  Procedures  . POCT urinalysis dipstick   No orders of the defined types were placed in this encounter.   Follow up in 2 Weeks.

## 2013-11-25 NOTE — BHH Counselor (Signed)
Clinical research associate provided pt with following resources including Lexicographer for [employer] [facility] [place]:  [facility]  [facility] [phone] or [phone] [address] [place], [place] [address] [email or web address]  Psychiatrists [facility]   [facility] [address]    [address] [place], [place] [address]    [place], [place] [address] [phone]      [phone]  Dr. Archer Asa     [facility] [address]    [address] [place] [address]    [place] [address] [phone]      [phone]  Therapists [facility]    [facility] [address]   [address] [place], [place] [phone]      [phone]  [facility] [address]     [address] [place] [address]    [place], [place] [phone]      [phone]   Mobile Crisis Teams [facility]    Assertive  Mobile Crisis Care Unit    [facility] Services [phone]     [address], [place], [place]        [phone] [facility] Service  [address] [place], [place] [address]  [phone]  [email or web address]   Evette Cristal, [place] Assessment Counselor

## 2013-11-25 NOTE — ED Provider Notes (Signed)
CSN: [id number]     Arrival date & time 11/25/13  1131 History  This chart was scribed for non-physician practitioner, Elpidio AnisShari Casmere Hollenbeck, PA-C working with Ward GivensIva L Knapp, MD by Greggory StallionKayla Andersen, ED scribe. This patient was seen in room WTR4/WLPT4 and the patient's care was started at 1:24 PM.   Chief Complaint  Patient presents with  . Medical Clearance  . Depression   The history is provided by the patient. No language interpreter was used.   HPI Comments: Felicia Holt is a 18 y.o. female that is [redacted] weeks pregnant who presents to the Emergency Department for medical clearance. She states she was sent here from Dr. Verdell CarmineHarper's office due to a higher level of depression. Her depression started around the beginning of her pregnancy and has had intermittent suicidal ideations since then. Mother thinks it might be because she tried to talk pt out of getting an abortion. States pt has been crying a lot and had a significant change in her attitude. Mother states pt has times where she is happy but states it can change quickly. The patient does verbalize any information, symptoms or contribute to history. Mother asked to leave the room but the patient continues to be nonverbal. When asked if she has suicidal ideas, she does not answer. This cannot be confirmed or denied. Denies vaginal bleeding, audio or visual hallucinations.   History reviewed. No pertinent past medical history. Past Surgical History  Procedure Laterality Date  . Tonsillectomy    . Wisdom tooth extraction    . Adenoidectomy     Family History  Problem Relation Age of Onset  . Hypertension Mother   . Hypertension Father   . Diabetes Maternal Grandmother   . COPD Maternal Grandmother    History  Substance Use Topics  . Smoking status: Never Smoker   . Smokeless tobacco: Never Used  . Alcohol Use: No   OB History   Grav Para Term Preterm Abortions TAB SAB Ect Mult Living   1              Review of Systems  Gastrointestinal:  Negative for abdominal pain.  Genitourinary: Negative for vaginal bleeding.  Psychiatric/Behavioral: Positive for dysphoric mood.       See HPI.  All other systems reviewed and are negative.  Allergies  Review of patient's allergies indicates no known allergies.  Home Medications   Prior to Admission medications   Medication Sig Start Date End Date Taking? Authorizing Provider  Prenat-FeCbn-FeAsp-Meth-FA-DHA (PRENATE MINI) 18-0.6-0.4-350 MG CAPS Take 1 capsule by mouth daily. 10/12/13  Yes Antionette CharLisa Barcomb-Moore, MD   BP 103/55  Pulse 100  Resp 18  SpO2 100%  Physical Exam  Nursing note and vitals reviewed. Constitutional: She is oriented to person, place, and time. She appears well-developed and well-nourished. No distress.  HENT:  Head: Normocephalic and atraumatic.  Eyes: EOM are normal.  Neck: Neck supple. No tracheal deviation present.  Cardiovascular: Normal rate.   Pulmonary/Chest: Effort normal. No respiratory distress.  Abdominal:  Gravid abdomen.   Musculoskeletal: Normal range of motion.  Neurological: She is alert and oriented to person, place, and time.  Skin: Skin is warm and dry.  Psychiatric: Her behavior is normal. Her affect is labile.  SI cannot be determined.    ED Course  Procedures (including critical care time)  DIAGNOSTIC STUDIES: Oxygen Saturation is 100% on RA, normal by my interpretation.    COORDINATION OF CARE: 1:30 PM-Discussed treatment plan which includes lab work with pt  at bedside and pt agreed to plan. Pt gave permission to speak to her mother about what was discussed with mom not in the room.   Labs Review Labs Reviewed  CBC - Abnormal; Notable for the following:    WBC 13.8 (*)    Hemoglobin 11.6 (*)    HCT 34.1 (*)    All other components within normal limits  COMPREHENSIVE METABOLIC PANEL - Abnormal; Notable for the following:    Sodium 134 (*)    Glucose, Bld 208 (*)    Albumin 2.8 (*)    Total Bilirubin <0.2 (*)    All  other components within normal limits  SALICYLATE LEVEL - Abnormal; Notable for the following:    Salicylate Lvl <2.0 (*)    All other components within normal limits  ACETAMINOPHEN LEVEL  ETHANOL  URINE RAPID DRUG SCREEN (HOSP PERFORMED)    Imaging Review No results found.   EKG Interpretation None      MDM   Final diagnoses:  None    1. Depression  [facility] evaluation is required to determine patient's safety. Medical clearance provided for psychiatric evaluation.  I personally performed the services described in this documentation, which was scribed in my presence. The recorded information has been reviewed and is accurate.  Arnoldo Hooker, PA-C 12/02/13 [phone]

## 2013-11-25 NOTE — ED Notes (Signed)
Pt sent here from Dr Verdell Carmine office due to higher level of depression.  Pt states that the depression started around the beginning of her pregnancy and she is currently 30 weeks.  Pt states that at times she does have SI but denies any forms of hurting herself in the past 30 days.  When asked if pt has a plan of she states, "yeah, different stuff comes up, but i never do it".  Pt wont specifically state what her plans for SI are.

## 2013-11-25 NOTE — Progress Notes (Signed)
Patient c/o yeast infection. Patient has blood vessels in her eyes that have burst. Patient is eating a lot of ice and craving sand. 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 See depression screening score 16.

## 2013-11-25 NOTE — Consult Note (Signed)
[facility] Face-to-Face Psychiatry Consult   Reason for Consult:  Depression Referring Physician:  EDP  Felicia Holt is an 18 y.o. female. Total Time spent with patient: 45 minutes  Assessment: AXIS I:  Depressive Disorder NOS AXIS II:  Deferred AXIS III:  History reviewed. No pertinent past medical history. AXIS IV:  other psychosocial or environmental problems AXIS V:  61-70 mild symptoms  Plan:  No evidence of imminent risk to self or others at present.   Patient does not meet criteria for psychiatric inpatient admission. Supportive therapy provided about ongoing stressors. Discussed crisis plan, support from social network, calling 911, coming to the Emergency Department, and calling Suicide Hotline.  Subjective:   Felicia Holt is a 18 y.o. female patient.  HPI:  Patient states "I don't even know what depression is.  I went to the doctor and he asked me some questions and said I was depressed and that I needed to come here..  Yes I feel sad; about being pregnant.  No I don't want to hurt/kill myself or nobody else." Patient states that she is sad related to being pregnant.  Patient states that she is pregnant by some one that she was not having a relationship with.  Patient states that she lives with her mom and dad who are supportive.  Patient states that she has no psych history.   Patient denies suicidal/homicidal ideation, psychosis, and paranoia. Patient gave permission to speak with her mother. Mother of patient states "Anesha got pregnant by another guy and not her boyfriend.  Before the pregnancy she was working and going to school really independent.  While pregnant her uncle died and missed time out of work cause they were really close; now she is leaving with me not working; so I'm having to help pay some of her bills, she is still with her boyfriend and he is really supportive.  But during the beginning of the pregnancy I /he family doesn't believe in aborting and her  boy/friend/family and the father of the baby/family doesn't believe in pregnancy so I think we made the decision for her because we were all grown up and she listened to [place]; I believe it was her decision she would have had abortion because she is not a baby person.  But everybody has been really supportive.  But the other day she had said that she wanted to give the baby up for adoption.   HPI Elements:   Location:  Depression. Quality:  feeling sad. Severity:  feeling sad. Duration:  several weeks.  Past Psychiatric History: History reviewed. No pertinent past medical history.  reports that she has never smoked. She has never used smokeless tobacco. She reports that she does not drink alcohol or use illicit drugs. Family History  Problem Relation Age of Onset  . Hypertension Mother   . Hypertension Father   . Diabetes Maternal Grandmother   . COPD Maternal Grandmother            Allergies:  No Known Allergies  ACT Assessment Complete:  No:   Past Psychiatric History: Diagnosis: Depressive Disorder, NOS  Hospitalizations:  Denies  Outpatient Care:  Denies  Substance Abuse Care:  Denies  Self-Mutilation:  Denies  Suicidal Attempts:  Denies  Homicidal Behaviors:  Denies   Violent Behaviors:  Denies   Place of Residence:  [place] Marital Status:  Single Employed/Unemployed:  Unemployed Education:   Family Supports:  Yes Objective: Blood pressure 103/55, pulse 100, resp. rate 18, SpO2 100.00%.There is  no weight on file to calculate BMI. Results for orders placed during the hospital encounter of 11/25/13 (from the past 72 hour(s))  ACETAMINOPHEN LEVEL     Status: None   Collection Time    11/25/13 12:28 PM      Result Value Ref Range   Acetaminophen (Tylenol), Serum <15.0  10 - 30 ug/mL   Comment:            THERAPEUTIC CONCENTRATIONS VARY     SIGNIFICANTLY. A RANGE OF 10-30     ug/mL MAY BE AN EFFECTIVE     CONCENTRATION FOR MANY PATIENTS.     HOWEVER, SOME ARE BEST  TREATED     AT CONCENTRATIONS OUTSIDE THIS     RANGE.     ACETAMINOPHEN CONCENTRATIONS     >150 ug/mL AT 4 HOURS AFTER     INGESTION AND >50 ug/mL AT 12     HOURS AFTER INGESTION ARE     OFTEN ASSOCIATED WITH TOXIC     REACTIONS.  CBC     Status: Abnormal   Collection Time    11/25/13 12:28 PM      Result Value Ref Range   WBC 13.8 (*) 4.0 - 10.5 K/uL   RBC 4.12  3.87 - 5.11 MIL/uL   Hemoglobin 11.6 (*) 12.0 - 15.0 g/dL   HCT 34.1 (*) 36.0 - 46.0 %   MCV 82.8  78.0 - 100.0 fL   MCH 28.2  26.0 - 34.0 pg   MCHC 34.0  30.0 - 36.0 g/dL   RDW 12.3  11.5 - 15.5 %   Platelets 246  150 - 400 K/uL  COMPREHENSIVE METABOLIC PANEL     Status: Abnormal   Collection Time    11/25/13 12:28 PM      Result Value Ref Range   Sodium 134 (*) 137 - 147 mEq/L   Potassium 4.4  3.7 - 5.3 mEq/L   Chloride 97  96 - 112 mEq/L   CO2 22  19 - 32 mEq/L   Glucose, Bld 208 (*) 70 - 99 mg/dL   BUN 10  6 - 23 mg/dL   Creatinine, Ser 0.59  0.50 - 1.10 mg/dL   Calcium 9.5  8.4 - 10.5 mg/dL   Total Protein 7.3  6.0 - 8.3 g/dL   Albumin 2.8 (*) 3.5 - 5.2 g/dL   AST 17  0 - 37 U/L   ALT 20  0 - 35 U/L   Alkaline Phosphatase 86  39 - 117 U/L   Total Bilirubin <0.2 (*) 0.3 - 1.2 mg/dL   GFR calc non Af Amer >90  >90 mL/min   GFR calc Af Amer >90  >90 mL/min   Comment: (NOTE)     The eGFR has been calculated using the CKD EPI equation.     This calculation has not been validated in all clinical situations.     eGFR's persistently <90 mL/min signify possible Chronic Kidney     Disease.  ETHANOL     Status: None   Collection Time    11/25/13 12:28 PM      Result Value Ref Range   Alcohol, Ethyl (B) <11  0 - 11 mg/dL   Comment:            LOWEST DETECTABLE LIMIT FOR     SERUM ALCOHOL IS 11 mg/dL     FOR MEDICAL PURPOSES ONLY  SALICYLATE LEVEL     Status: Abnormal   Collection Time  11/25/13 12:28 PM      Result Value Ref Range   Salicylate Lvl <6.0 (*) 2.8 - 20.0 mg/dL  URINE RAPID DRUG SCREEN  (HOSP PERFORMED)     Status: None   Collection Time    11/25/13 12:39 PM      Result Value Ref Range   Opiates NONE DETECTED  NONE DETECTED   Cocaine NONE DETECTED  NONE DETECTED   Benzodiazepines NONE DETECTED  NONE DETECTED   Amphetamines NONE DETECTED  NONE DETECTED   Tetrahydrocannabinol NONE DETECTED  NONE DETECTED   Barbiturates NONE DETECTED  NONE DETECTED   Comment:            DRUG SCREEN FOR MEDICAL PURPOSES     ONLY.  IF CONFIRMATION IS NEEDED     FOR ANY PURPOSE, NOTIFY LAB     WITHIN 5 DAYS.                LOWEST DETECTABLE LIMITS     FOR URINE DRUG SCREEN     Drug Class       Cutoff (ng/mL)     Amphetamine      1000     Barbiturate      200     Benzodiazepine   045     Tricyclics       997     Opiates          300     Cocaine          300     THC              50   Labs are reviewed.  Medications reviewed and no changes made  Current Facility-Administered Medications  Medication Dose Route Frequency Provider Last Rate Last Dose  . acetaminophen (TYLENOL) tablet 650 mg  650 mg Oral Q4H PRN Shari A Upstill, PA-C      . ondansetron (ZOFRAN) tablet 4 mg  4 mg Oral Q8H PRN Dewaine Oats, PA-C       Current Outpatient Prescriptions  Medication Sig Dispense Refill  . Prenat-FeCbn-FeAsp-Meth-FA-DHA (PRENATE MINI) 18-0.6-0.4-350 MG CAPS Take 1 capsule by mouth daily.  14 capsule  0    Psychiatric Specialty Exam:     Blood pressure 103/55, pulse 100, resp. rate 18, SpO2 100.00%.There is no weight on file to calculate BMI.  General Appearance: Casual and Fairly Groomed  Eye Contact::  Good  Speech:  Clear and Coherent and Normal Rate  Volume:  Decreased  Mood:  Depressed  Affect:  Congruent  Thought Process:  Circumstantial, Coherent and Goal Directed  Orientation:  Full (Time, Place, and Person)  Thought Content:  "I don't want to hurt myself or baby"  Suicidal Thoughts:  No  Homicidal Thoughts:  No  Memory:  Immediate;   Good Recent;   Good Remote;   Good   Judgement:  Intact  Insight:  Present  Psychomotor Activity:  Normal  Concentration:  Good  Recall:  Good  Fund of Knowledge:Good  Language: Good  Akathisia:  No  Handed:  Right  AIMS (if indicated):     Assets:  Communication Skills Desire for Improvement Housing Social Support  Sleep:      Musculoskeletal: Strength & Muscle Tone: within normal limits Gait & Station: normal Patient leans: N/A  Treatment Plan Summary: Discharge home with outpatient resources for therapy  Earleen Newport, FNP-BC 11/25/2013 3:49 PM

## 2013-11-26 NOTE — Consult Note (Signed)
Face to face evaluation and I agree with this note 

## 2013-12-02 ENCOUNTER — Encounter: Payer: Self-pay | Admitting: *Deleted

## 2013-12-04 ENCOUNTER — Encounter: Payer: Self-pay | Admitting: *Deleted

## 2013-12-04 NOTE — ED Provider Notes (Signed)
Medical screening examination/treatment/procedure(s) were performed by non-physician practitioner and as supervising physician I was immediately available for consultation/collaboration.   EKG Interpretation None      Bonnita Newby, MD, FACEP   Londyn Wotton L Toriano Aikey, MD 12/04/13 1310 

## 2013-12-09 ENCOUNTER — Encounter: Payer: Medicaid Other | Admitting: Obstetrics

## 2013-12-14 ENCOUNTER — Ambulatory Visit (INDEPENDENT_AMBULATORY_CARE_PROVIDER_SITE_OTHER): Payer: Medicaid Other | Admitting: Obstetrics & Gynecology

## 2013-12-14 ENCOUNTER — Encounter: Payer: Self-pay | Admitting: Obstetrics & Gynecology

## 2013-12-14 VITALS — BP 126/81 | HR 106 | Temp 97.6°F | Wt 248.0 lb

## 2013-12-14 DIAGNOSIS — Z3400 Encounter for supervision of normal first pregnancy, unspecified trimester: Secondary | ICD-10-CM

## 2013-12-14 DIAGNOSIS — O365990 Maternal care for other known or suspected poor fetal growth, unspecified trimester, not applicable or unspecified: Secondary | ICD-10-CM

## 2013-12-14 LAB — POCT URINALYSIS DIPSTICK
Bilirubin, UA: NEGATIVE
Blood, UA: NEGATIVE
Glucose, UA: 1000
Ketones, UA: NEGATIVE
NITRITE UA: NEGATIVE
PH UA: 6.5
Protein, UA: NEGATIVE
Spec Grav, UA: <=1.005
UROBILINOGEN UA: NEGATIVE

## 2013-12-14 MED ORDER — ACCU-CHEK AVIVA PLUS W/DEVICE KIT: 1.0000 | PACK | Freq: Once | Status: DC

## 2013-12-14 MED ORDER — ACCU-CHEK SOFTCLIX LANCETS MISC: Status: DC

## 2013-12-14 MED ORDER — GLUCOSE BLOOD VI STRP: ORAL_STRIP | Status: DC

## 2013-12-14 NOTE — Progress Notes (Signed)
Subjective:    Felicia Holt is a 18 y.o. female being seen today for her obstetrical visit. She is at 61w6dgestation. Patient reports no complaints. Fetal movement: normal.  Problem List Items Addressed This Visit   None    Visit Diagnoses   Poor fetal growth, affecting management of mother, antepartum condition or complication    -  Primary    Relevant Orders       [place] Follow Up    Supervision of normal first pregnancy        Relevant Orders       POCT urinalysis dipstick (Completed)      Patient Active Problem List   Diagnosis Date Noted  . Depression 11/25/2013  . Gestational diabetes 11/15/2013  . Obesity complicating pregnancy 095/03/3266 . Supervision of low-risk first pregnancy 07/08/2013   Objective:    BP 126/81  Pulse 106  Temp(Src) 97.6 F (36.4 C)  Wt 112.492 kg (248 lb) FHT:  160 BPM  Uterine Size: size equals dates  Presentation: cephalic     Assessment:    Pregnancy @ 341w6deeks   Plan:     labs reviewed, problem list updated Start weekly NST at next visit Orders Placed This Encounter  Procedures  . [place] Follow Up    Standing Status: Future     Number of Occurrences:      Standing Expiration Date: 02/14/2015    Order Specific Question:  Reason for Exam (SYMPTOM  OR DIAGNOSIS REQUIRED)    Answer:  Growth    Order Specific Question:  Preferred imaging location?    Answer:  Internal  . POCT urinalysis dipstick   Meds ordered this encounter  Medications  . glucose blood test strip    Sig: Use as instructed    Dispense:  100 each    Refill:  12  . Blood Glucose Monitoring Suppl (ACCU-CHEK AVIVA PLUS) W/DEVICE KIT    Sig: 1 Device by Does not apply route once.    Dispense:  1 kit    Refill:  0    Please see that you instruct patient on use of this meter.  She is newly diagnosed and will need instruction  . ACCU-CHEK SOFTCLIX LANCETS lancets    Sig: Use as instructed    Dispense:  100 each    Refill:  12    Please make sure that  lancets are correct for this meter.  If not please correct for patient or send notification to provider office.   Follow up in 1 Week.

## 2013-12-14 NOTE — Patient Instructions (Signed)
Nonstress Test The nonstress test is a procedure that monitors the fetus's heartbeat. The test will monitor the heartbeat when the fetus is at rest and while the fetus is moving. In a healthy fetus, there will be an increase in fetal heart rate when the fetus moves or kicks. The heart rate will decrease at rest. This test helps determine if the fetus is healthy. The test may take up to a half hour. Your caregiver will look at a number of patterns in the heart rate tracing to make sure your baby is thriving. If there is concern, your caregiver may order additional tests or may suggest another course of action. This test is often done in the third trimester and can help determine if an early delivery is needed and safe. Common reasons to have this test are:  You are past your due date.  You have a high-risk pregnancy.  You are feeling less movement than normal.  You have lost a pregnancy in the past.  Your caregiver suspects fetal growth problems.  You have too much or too little amniotic fluid. BEFORE THE PROCEDURE  Eat a meal right before the test or as directed by your caregiver. Food may help stimulate fetal movements.  Use the restroom right before the test. PROCEDURE  Two belts will be placed around your abdomen. These belts have monitors attached to them. One records the fetal heart rate and the other records uterine contractions.  You may be asked to lie down on your side or to stay sitting upright.  You may be given a button to press when you feel movement.  The fetal heartbeat is listened to and watched on a screen. The heartbeat is recorded on a sheet of paper.  If the fetus seems to be sleeping, you may be asked to drink some juice or soda, gently press your abdomen, or make some noise to wake the fetus. AFTER THE PROCEDURE  Your caregiver will discuss the test results with you and make recommendations for the near future. Document Released: 06/08/2002 Document Revised:  10/13/2012 Document Reviewed: 07/22/2012 ExitCare Patient Information 2014 [employer].  

## 2013-12-23 ENCOUNTER — Inpatient Hospital Stay (HOSPITAL_COMMUNITY)
Admission: AD | Admit: 2013-12-23 | Discharge: 2013-12-28 | DRG: 781 | Disposition: A | Discharge status: Home / Self Care | Payer: Medicaid Other | Source: Ambulatory Visit | Attending: Obstetrics & Gynecology | Admitting: Obstetrics & Gynecology

## 2013-12-23 ENCOUNTER — Encounter (HOSPITAL_COMMUNITY): Payer: Self-pay | Admitting: *Deleted

## 2013-12-23 ENCOUNTER — Ambulatory Visit (INDEPENDENT_AMBULATORY_CARE_PROVIDER_SITE_OTHER): Payer: Medicaid Other | Admitting: Obstetrics & Gynecology

## 2013-12-23 ENCOUNTER — Ambulatory Visit (INDEPENDENT_AMBULATORY_CARE_PROVIDER_SITE_OTHER): Payer: Medicaid Other

## 2013-12-23 VITALS — BP 118/73 | HR 99 | Wt 245.0 lb

## 2013-12-23 DIAGNOSIS — O24414 Gestational diabetes mellitus in pregnancy, insulin controlled: Secondary | ICD-10-CM

## 2013-12-23 DIAGNOSIS — O99810 Abnormal glucose complicating pregnancy: Principal | ICD-10-CM | POA: Diagnosis present

## 2013-12-23 DIAGNOSIS — O365990 Maternal care for other known or suspected poor fetal growth, unspecified trimester, not applicable or unspecified: Secondary | ICD-10-CM

## 2013-12-23 DIAGNOSIS — O4700 False labor before 37 completed weeks of gestation, unspecified trimester: Secondary | ICD-10-CM | POA: Diagnosis present

## 2013-12-23 DIAGNOSIS — O24419 Gestational diabetes mellitus in pregnancy, unspecified control: Secondary | ICD-10-CM

## 2013-12-23 DIAGNOSIS — O99213 Obesity complicating pregnancy, third trimester: Secondary | ICD-10-CM

## 2013-12-23 DIAGNOSIS — Z3400 Encounter for supervision of normal first pregnancy, unspecified trimester: Secondary | ICD-10-CM

## 2013-12-23 DIAGNOSIS — Z3403 Encounter for supervision of normal first pregnancy, third trimester: Secondary | ICD-10-CM

## 2013-12-23 DIAGNOSIS — O24410 Gestational diabetes mellitus in pregnancy, diet controlled: Secondary | ICD-10-CM

## 2013-12-23 DIAGNOSIS — Z794 Long term (current) use of insulin: Secondary | ICD-10-CM

## 2013-12-23 HISTORY — DX: Gestational diabetes mellitus in pregnancy, unspecified control: O24.419

## 2013-12-23 LAB — URINE MICROSCOPIC-ADD ON

## 2013-12-23 LAB — TYPE AND SCREEN
ABO/RH(D): AB POS
Antibody Screen: NEGATIVE

## 2013-12-23 LAB — US OB FOLLOW UP

## 2013-12-23 LAB — URINALYSIS, ROUTINE W REFLEX MICROSCOPIC
BILIRUBIN URINE: NEGATIVE
KETONES UR: 15 mg/dL — AB
Leukocytes, UA: NEGATIVE
Nitrite: NEGATIVE
PH: 6 (ref 5.0–8.0)
PROTEIN: NEGATIVE mg/dL
Specific Gravity, Urine: 1.015 (ref 1.005–1.030)
Urobilinogen, UA: 0.2 mg/dL (ref 0.0–1.0)

## 2013-12-23 LAB — CBC
HEMATOCRIT: 31.1 % — AB (ref 36.0–46.0)
HEMOGLOBIN: 10.8 g/dL — AB (ref 12.0–15.0)
MCH: 27.7 pg (ref 26.0–34.0)
MCHC: 34.7 g/dL (ref 30.0–36.0)
MCV: 79.7 fL (ref 78.0–100.0)
Platelets: 227 10*3/uL (ref 150–400)
RBC: 3.9 MIL/uL (ref 3.87–5.11)
RDW: 12.7 % (ref 11.5–15.5)
WBC: 8.5 10*3/uL (ref 4.0–10.5)

## 2013-12-23 LAB — GLUCOSE, POCT (MANUAL RESULT ENTRY): POC GLUCOSE: 293 mg/dL — AB (ref 70–99)

## 2013-12-23 LAB — GLUCOSE, CAPILLARY: Glucose-Capillary: 297 mg/dL — ABNORMAL HIGH (ref 70–99)

## 2013-12-23 MED ORDER — INSULIN NPH (HUMAN) (ISOPHANE) 100 UNIT/ML ~~LOC~~ SUSP
20.0000 [IU] | Freq: Every day | SUBCUTANEOUS | Status: DC
  Administered 2013-12-24: 20 [IU] via SUBCUTANEOUS

## 2013-12-23 MED ORDER — INSULIN ASPART 100 UNIT/ML ~~LOC~~ SOLN
10.0000 [IU] | Freq: Three times a day (TID) | SUBCUTANEOUS | Status: DC
  Administered 2013-12-23: 10 [IU] via SUBCUTANEOUS

## 2013-12-23 MED ORDER — SODIUM CHLORIDE 0.9 % IJ SOLN: 3.0000 mL | INTRAMUSCULAR | Status: DC | PRN

## 2013-12-23 MED ORDER — SODIUM CHLORIDE 0.9 % IJ SOLN
3.0000 mL | Freq: Two times a day (BID) | INTRAMUSCULAR | Status: DC
  Administered 2013-12-23 – 2013-12-27 (×9): 3 mL via INTRAVENOUS

## 2013-12-23 MED ORDER — DOCUSATE SODIUM 100 MG PO CAPS
100.0000 mg | ORAL_CAPSULE | Freq: Every day | ORAL | Status: DC
  Administered 2013-12-24 – 2013-12-27 (×4): 100 mg via ORAL
  Filled 2013-12-23 (×5): qty 1

## 2013-12-23 MED ORDER — ACETAMINOPHEN 325 MG PO TABS: 650.0000 mg | ORAL_TABLET | ORAL | Status: DC | PRN

## 2013-12-23 MED ORDER — CALCIUM CARBONATE ANTACID 500 MG PO CHEW: 2.0000 | CHEWABLE_TABLET | ORAL | Status: DC | PRN

## 2013-12-23 MED ORDER — PRENATAL MULTIVITAMIN CH
1.0000 | ORAL_TABLET | Freq: Every day | ORAL | Status: DC
  Administered 2013-12-24 – 2013-12-28 (×5): 1 via ORAL
  Filled 2013-12-23 (×5): qty 1

## 2013-12-23 MED ORDER — SODIUM CHLORIDE 0.9 % IV SOLN
250.0000 mL | INTRAVENOUS | Status: DC | PRN
Start: 2013-12-23 — End: 2013-12-28

## 2013-12-23 MED ORDER — INSULIN ASPART 100 UNIT/ML ~~LOC~~ SOLN
0.0000 [IU] | Freq: Every day | SUBCUTANEOUS | Status: DC
  Administered 2013-12-23: 3 [IU] via SUBCUTANEOUS

## 2013-12-23 MED ORDER — ZOLPIDEM TARTRATE 5 MG PO TABS
5.0000 mg | ORAL_TABLET | Freq: Every evening | ORAL | Status: DC | PRN
  Administered 2013-12-25 – 2013-12-28 (×4): 5 mg via ORAL
  Filled 2013-12-23 (×6): qty 1

## 2013-12-23 MED ORDER — INSULIN NPH (HUMAN) (ISOPHANE) 100 UNIT/ML ~~LOC~~ SUSP
10.0000 [IU] | Freq: Every day | SUBCUTANEOUS | Status: DC
  Administered 2013-12-23: 10 [IU] via SUBCUTANEOUS
  Filled 2013-12-23: qty 10

## 2013-12-23 MED ORDER — INSULIN ASPART 100 UNIT/ML ~~LOC~~ SOLN: 0.0000 [IU] | Freq: Three times a day (TID) | SUBCUTANEOUS | Status: DC

## 2013-12-23 NOTE — Patient Instructions (Signed)
Third Trimester of Pregnancy The third trimester is from week 29 through week 42, months 7 through 9. The third trimester is a time when the fetus is growing rapidly. At the end of the ninth month, the fetus is about 20 inches in length and weighs 6-10 pounds.  BODY CHANGES Your body goes through many changes during pregnancy. The changes vary from woman to woman.   Your weight will continue to increase. You can expect to gain 25-35 pounds (11-16 kg) by the end of the pregnancy.  You may begin to get stretch marks on your hips, abdomen, and breasts.  You may urinate more often because the fetus is moving lower into your pelvis and pressing on your bladder.  You may develop or continue to have heartburn as a result of your pregnancy.  You may develop constipation because certain hormones are causing the muscles that push waste through your intestines to slow down.  You may develop hemorrhoids or swollen, bulging veins (varicose veins).  You may have pelvic pain because of the weight gain and pregnancy hormones relaxing your joints between the bones in your pelvis. Backaches may result from overexertion of the muscles supporting your posture.  You may have changes in your hair. These can include thickening of your hair, rapid growth, and changes in texture. Some women also have hair loss during or after pregnancy, or hair that feels dry or thin. Your hair will most likely return to normal after your baby is born.  Your breasts will continue to grow and be tender. A yellow discharge may leak from your breasts called colostrum.  Your belly button may stick out.  You may feel short of breath because of your expanding uterus.  You may notice the fetus "dropping," or moving lower in your abdomen.  You may have a bloody mucus discharge. This usually occurs a few days to a week before labor begins.  Your cervix becomes thin and soft (effaced) near your due date. WHAT TO EXPECT AT YOUR PRENATAL  EXAMS  You will have prenatal exams every 2 weeks until week 36. Then, you will have weekly prenatal exams. During a routine prenatal visit:  You will be weighed to make sure you and the fetus are growing normally.  Your blood pressure is taken.  Your abdomen will be measured to track your baby's growth.  The fetal heartbeat will be listened to.  Any test results from the previous visit will be discussed.  You may have a cervical check near your due date to see if you have effaced. At around 36 weeks, your caregiver will check your cervix. At the same time, your caregiver will also perform a test on the secretions of the vaginal tissue. This test is to determine if a type of bacteria, Group B streptococcus, is present. Your caregiver will explain this further. Your caregiver may ask you:  What your birth plan is.  How you are feeling.  If you are feeling the baby move.  If you have had any abnormal symptoms, such as leaking fluid, bleeding, severe headaches, or abdominal cramping.  If you have any questions. Other tests or screenings that may be performed during your third trimester include:  Blood tests that check for low iron levels (anemia).  Fetal testing to check the health, activity level, and growth of the fetus. Testing is done if you have certain medical conditions or if there are problems during the pregnancy. FALSE LABOR You may feel small, irregular contractions that   eventually go away. These are called Braxton Hicks contractions, or false labor. Contractions may last for hours, days, or even weeks before true labor sets in. If contractions come at regular intervals, intensify, or become painful, it is best to be seen by your caregiver.  SIGNS OF LABOR   Menstrual-like cramps.  Contractions that are 5 minutes apart or less.  Contractions that start on the top of the uterus and spread down to the lower abdomen and back.  A sense of increased pelvic pressure or back  pain.  A watery or bloody mucus discharge that comes from the vagina. If you have any of these signs before the 37th week of pregnancy, call your caregiver right away. You need to go to the hospital to get checked immediately. HOME CARE INSTRUCTIONS   Avoid all smoking, herbs, alcohol, and unprescribed drugs. These chemicals affect the formation and growth of the baby.  Follow your caregiver's instructions regarding medicine use. There are medicines that are either safe or unsafe to take during pregnancy.  Exercise only as directed by your caregiver. Experiencing uterine cramps is a good sign to stop exercising.  Continue to eat regular, healthy meals.  Wear a good support bra for breast tenderness.  Do not use hot tubs, steam rooms, or saunas.  Wear your seat belt at all times when driving.  Avoid raw meat, uncooked cheese, cat litter boxes, and soil used by cats. These carry germs that can cause birth defects in the baby.  Take your prenatal vitamins.  Try taking a stool softener (if your caregiver approves) if you develop constipation. Eat more high-fiber foods, such as fresh vegetables or fruit and whole grains. Drink plenty of fluids to keep your urine clear or pale yellow.  Take warm sitz baths to soothe any pain or discomfort caused by hemorrhoids. Use hemorrhoid cream if your caregiver approves.  If you develop varicose veins, wear support hose. Elevate your feet for 15 minutes, 3-4 times a day. Limit salt in your diet.  Avoid heavy lifting, wear low heal shoes, and practice good posture.  Rest a lot with your legs elevated if you have leg cramps or low back pain.  Visit your dentist if you have not gone during your pregnancy. Use a soft toothbrush to brush your teeth and be gentle when you floss.  A sexual relationship may be continued unless your caregiver directs you otherwise.  Do not travel far distances unless it is absolutely necessary and only with the approval  of your caregiver.  Take prenatal classes to understand, practice, and ask questions about the labor and delivery.  Make a trial run to the hospital.  Pack your hospital bag.  Prepare the baby's nursery.  Continue to go to all your prenatal visits as directed by your caregiver. SEEK MEDICAL CARE IF:  You are unsure if you are in labor or if your water has broken.  You have dizziness.  You have mild pelvic cramps, pelvic pressure, or nagging pain in your abdominal area.  You have persistent nausea, vomiting, or diarrhea.  You have a bad smelling vaginal discharge.  You have pain with urination. SEEK IMMEDIATE MEDICAL CARE IF:   You have a fever.  You are leaking fluid from your vagina.  You have spotting or bleeding from your vagina.  You have severe abdominal cramping or pain.  You have rapid weight loss or gain.  You have shortness of breath with chest pain.  You notice sudden or extreme swelling   of your face, hands, ankles, feet, or legs.  You have not felt your baby move in over an hour.  You have severe headaches that do not go away with medicine.  You have vision changes. Document Released: 06/12/2001 Document Revised: 06/23/2013 Document Reviewed: 08/19/2012 ExitCare Patient Information 2015 [employer]. This information is not intended to replace advice given to you by your health care Woodrow Dulski. Make sure you discuss any questions you have with your health care Rosaline Ezekiel.  

## 2013-12-23 NOTE — H&P (Signed)
Felicia Holt is a 18 y.o. female presenting for diabetes management. Maternal Medical History:  Reason for admission: Recently diagnosed with GDM.  CBGs per pt 200 - 400.  CBG in office today: 290  Contractions: Frequency: irregular.    Fetal activity: Perceived fetal activity is normal.    Prenatal Complications - Diabetes: gestational.    OB History   Grav Para Term Preterm Abortions TAB SAB Ect Mult Living   1         0     History reviewed. No pertinent past medical history. Past Surgical History  Procedure Laterality Date  . Tonsillectomy    . Wisdom tooth extraction    . Adenoidectomy     Family History: family history includes COPD in her maternal grandmother; Cancer in her maternal grandmother; Depression in her father, maternal grandmother, and mother; Early death in her paternal grandmother; Hypertension in her father, maternal grandmother, and mother; Learning disabilities in her brother. Social History:  reports that she has never smoked. She has never used smokeless tobacco. She reports that she does not drink alcohol or use illicit drugs.     Review of Systems  Constitutional: Negative for fever.  Eyes: Negative for blurred vision.  Respiratory: Negative for shortness of breath.   Gastrointestinal: Negative for vomiting.  Skin: Negative for rash.  Neurological: Negative for headaches.      Blood pressure 132/68, pulse 99, temperature 98 F (36.7 C), temperature source Oral, resp. rate 20, height 5\' 2"  (1.575 m), weight 111.131 kg (245 lb). Maternal Exam:  Uterine Assessment: Contraction frequency is irregular.   Abdomen: Patient reports no abdominal tenderness. Fundal height is 36 cm.   Fetal presentation: vertex  Introitus: not evaluated.   Cervix: not evaluated.   Fetal Exam Fetal Monitor Review: Baseline rate: 140.  Variability: moderate (6-25 bpm).   Pattern: accelerations present.    Fetal State Assessment: Category I - tracings are  normal.     Physical Exam  Constitutional: She appears well-developed.  HENT:  Head: Normocephalic.  Neck: Neck supple. No thyromegaly present.  Cardiovascular: Normal rate and regular rhythm.   Respiratory: Breath sounds normal.  GI: Soft. Bowel sounds are normal.  Skin: No rash noted.    Prenatal labs: ABO, Rh: AB/POS/-- (01/07 1134) Antibody: NEG (01/07 1134) Rubella: 0.56 (01/07 1134) RPR: NON REAC (05/11 1733)  HBsAg: NEGATIVE (01/07 1134)  HIV: NONREACTIVE (05/11 1733)  GBS:     Assessment/Plan: IUP @ 6844w1d.  Newly diagnosed GDM, poor control.  Hyperglycemic earlier today in the office.  Fetal testing reassuring.  Admit Check CBGs  Start insulin NST q shift Consult to diabetes coordinator   Viall-MOORE,LISA A 12/23/2013, 9:15 PM

## 2013-12-23 NOTE — Progress Notes (Signed)
Subjective:    Felicia ChristenJanai Holt is a 18 y.o. female being seen today for her obstetrical visit. She is at 682w1d gestation. Patient reports no complaints. Fetal movement: normal. CBGs 200 - 400; not checking fastings Problem List Items Addressed This Visit   None    Visit Diagnoses   Encounter for supervision of normal first pregnancy in third trimester    -  Primary    Relevant Orders       POCT urinalysis dipstick    Diet controlled gestational diabetes mellitus in third trimester        Relevant Orders       POCT Glucose (CBG) (Completed)      Patient Active Problem List   Diagnosis Date Noted  . Depression 11/25/2013  . Gestational diabetes 11/15/2013  . Obesity complicating pregnancy 10/12/2013  . Supervision of low-risk first pregnancy 07/08/2013   Objective:    BP 118/73  Pulse 99  Wt 111.131 kg (245 lb) FHT:  140 BPM  Uterine Size: size equals dates  Presentation: cephalic     Assessment:    Pregnancy @ 312w1d weeks  Hyperglycemia/poorly controlled GDM on diet Plan:     labs reviewed, problem list updated Admit to start insulin/diabetes management Orders Placed This Encounter  Procedures  . POCT urinalysis dipstick  . POCT Glucose (CBG)    Follow up in 1 Week.

## 2013-12-23 NOTE — Plan of Care (Signed)
Problem: Consults Goal: Birthing Suites Patient Information Press F2 to bring up selections list   Pt < [redacted] weeks EGA     

## 2013-12-24 ENCOUNTER — Encounter (HOSPITAL_COMMUNITY): Payer: Self-pay | Admitting: *Deleted

## 2013-12-24 LAB — POCT URINALYSIS DIPSTICK
BILIRUBIN UA: NEGATIVE
Blood, UA: NEGATIVE
GLUCOSE UA: 250
Leukocytes, UA: NEGATIVE
Nitrite, UA: NEGATIVE
Spec Grav, UA: 1.01
Urobilinogen, UA: NEGATIVE
pH, UA: 6

## 2013-12-24 LAB — GLUCOSE, CAPILLARY
GLUCOSE-CAPILLARY: 180 mg/dL — AB (ref 70–99)
GLUCOSE-CAPILLARY: 216 mg/dL — AB (ref 70–99)
GLUCOSE-CAPILLARY: 219 mg/dL — AB (ref 70–99)
GLUCOSE-CAPILLARY: 268 mg/dL — AB (ref 70–99)
Glucose-Capillary: 188 mg/dL — ABNORMAL HIGH (ref 70–99)
Glucose-Capillary: 329 mg/dL — ABNORMAL HIGH (ref 70–99)

## 2013-12-24 LAB — ABO/RH: ABO/RH(D): AB POS

## 2013-12-24 MED ORDER — INSULIN STARTER KIT- PEN NEEDLES (ENGLISH)
1.0000 | Freq: Once | Status: AC
  Administered 2013-12-24: 1
  Filled 2013-12-24: qty 1

## 2013-12-24 MED ORDER — INSULIN ASPART 100 UNIT/ML ~~LOC~~ SOLN
0.0000 [IU] | Freq: Three times a day (TID) | SUBCUTANEOUS | Status: DC
  Administered 2013-12-24 (×2): 8 [IU] via SUBCUTANEOUS
  Administered 2013-12-24: 24 [IU] via SUBCUTANEOUS
  Administered 2013-12-24: 12 [IU] via SUBCUTANEOUS
  Administered 2013-12-25 (×2): 8 [IU] via SUBCUTANEOUS
  Administered 2013-12-26 (×3): 5 [IU] via SUBCUTANEOUS
  Administered 2013-12-26: 4 [IU] via SUBCUTANEOUS
  Administered 2013-12-27 – 2013-12-28 (×2): 5 [IU] via SUBCUTANEOUS

## 2013-12-24 MED ORDER — INSULIN ASPART 100 UNIT/ML ~~LOC~~ SOLN
12.0000 [IU] | Freq: Three times a day (TID) | SUBCUTANEOUS | Status: DC
  Administered 2013-12-24: 12 [IU] via SUBCUTANEOUS
  Administered 2013-12-24: 100 [IU] via SUBCUTANEOUS
  Administered 2013-12-24 – 2013-12-25 (×2): 12 [IU] via SUBCUTANEOUS
  Administered 2013-12-25: 100 [IU] via SUBCUTANEOUS
  Administered 2013-12-25 – 2013-12-28 (×8): 12 [IU] via SUBCUTANEOUS

## 2013-12-24 MED ORDER — INSULIN NPH (HUMAN) (ISOPHANE) 100 UNIT/ML ~~LOC~~ SUSP
20.0000 [IU] | Freq: Two times a day (BID) | SUBCUTANEOUS | Status: DC
  Administered 2013-12-24 – 2013-12-25 (×2): 20 [IU] via SUBCUTANEOUS

## 2013-12-24 NOTE — Progress Notes (Signed)
   Nutrition Dx: Food and nutrition-related knowledge deficit r/t limited comprehension of previous education aeb pt report.  Nutrition education consult for Carbohydrate Modified Gestational Diabetic Diet completed.  "Meal  plan for gestational diabetics" handout given to patient. Mother and sister were present for the education. Basic concepts reviewed. Consequences to baby reviewed. Questions answered.  Patient verbalizes basic  understanding. Pt had almost no comprehension of diet education completed prior to adm. Typical home diet is fast food, pt diet quiet low in fruits and veg, high in CHO's and fat. Pt having difficultly coming to the realization that this diet is not appropriate during preg.  She would probably benefit from further follow-up outpt. Mother and sister grasped parameters of diet.   Elisabeth CaraKatherine Barnie Sopko M.Odis LusterEd. R.D. LDN Neonatal Nutrition Support Specialist/RD III Pager [phone]

## 2013-12-24 NOTE — Progress Notes (Addendum)
Referral received.  Patient admitted for gestational diabetes.  She states that her mother was helping check her CBG's q 2 hours.  She reports that her readings were as high as the 300-400 range at times.  She did not seem to understand the consequences of such elevated CBG's and did not notify the MD.  Discussed goal ranges for CBG's during pregnancy including fasting less than 90 mg/dL, 1 hour PP< 140 mg/dL, and 2 hr PP<120 mg/dL.  Explained that she would need insulin which would include 2 shots of NPH daily, and Novolog with meals (tid).  Also discussed monitoring and the need to check fasting, and 2 hr PP CBG's.  RN came in to give patient insulin.   Patient refused to administer herself and states "my Mom will give it".  Reinforced need for her to learn to self-administer in case Mom is not present.  She agrees to try with her next injection.  Showed her a brief movie on "gestational diabetes".  Also discussed insulin pen use.  She is interested in insulin pen.  Will reinforce teaching on 6/26.  I am concerned about compliance and her understanding of the consequences of elevated CBG's.  Will ask RN's to reinforce.   Gave patient handouts regarding "Gestational Diabetes" including monitoring, CBG goals, diet, complications, and insulin starter kit.   Thanks, Adah Perl, RN, BC-ADM Inpatient Diabetes Coordinator Pager [phone]

## 2013-12-24 NOTE — Progress Notes (Signed)
UR completed 

## 2013-12-24 NOTE — Progress Notes (Signed)
Inpatient Diabetes Program Recommendations  AACE/ADA: New Consensus Statement on Inpatient Glycemic Control (2013)  Target Ranges:  Prepandial:   less than 140 mg/dL      Peak postprandial:   less than 180 mg/dL (1-2 hours)      Critically ill patients:  140 - 180 mg/dL   Results for Donna ChristenJACKSON, Hayli (MRN [id number]) as of 12/24/2013 07:26  Ref. Range 12/23/2013 20:19 12/24/2013 00:42 12/24/2013 06:27  Glucose-Capillary Latest Range: 70-99 mg/dL 409297 (H) 811268 (H) 914219 (H)    Reason for Visit: Consult for GDM  Diabetes history: GDM dx 11/15/13 Outpatient Diabetes medications: Diet control Current orders for Inpatient glycemic control: NPH 20 units QAM, NPH 10 units QHS, Novolog 0-9 units AC, Novolog 10 units TID with meals  Inpatient Diabetes Program Recommendations Insulin - Basal: Please consider increasing NPH to 20 units BID (QAM and QHS). Correction (SSI): Please use diabetic pregnant patient order set for correction scale (based on TDD 100 units (based on 111 kg x 0.9 units) fasting and 2 hours Post Prandial. Insulin - Meal Coverage: Please consider increasing Novolog meal coverage to 12 units TID with meals.  Note: Patient [redacted]W[redacted]D diagnosed with GDM on 11/15/13 with only diet control. Initial CBG 297 on 6/24 and patient started on NPH and Novolog for glycemic control. Patient received NPH 10 units on 6/24 at 22:27 and Novolog 13 units at 22:28 ([facility] units for [facility] and 3 units for correction). Currently Novolog correction is ordered by the regular inpatient glycemic control order set. Recommend discontinuing this order set and use Diabetic Pregnant Patient Order Set with CBGs and Novolog correction fasting and 2H Post Prandial.  Also, please consider increasing NPH to 20 units BID and increasing Novolog meal coverage to 12 units TID with meals. Diabetes Coordinator will plan to see patient today to discuss GDM and insulin. Please page diabetes coordinator if assistance is needed in making changes with the  correct order set.  Thanks, Orlando PennerMarie Byrd, RN, MSN, CCRN Diabetes Coordinator Inpatient Diabetes Program [phone] (Team Pager) [phone] ([facility] office) [phone] [facility] office)

## 2013-12-24 NOTE — Progress Notes (Signed)
Dietician at bedside.

## 2013-12-24 NOTE — Progress Notes (Signed)
Patient ID: Felicia ChristenJanai Holt, female   DOB: 1996-05-29, 18 y.o.   MRN: [id number] Hospital Day: 2  S: Preterm labor symptoms: noe  O: Blood pressure 114/51, pulse 90, temperature 98.1 F (36.7 C), temperature source Oral, resp. rate 18, height 5\' 2"  (1.575 m), weight 245 lb (111.131 kg).   WUJ:WJXBJYNWFHT:Baseline: 150 bpm Toco: None SVE:   A/P- 18 y.o. admitted with:  Abnormal glucose at 34 weeks.  Admitted for glucose management.  Diabetic teaching pending.  Present on Admission:  **None**  Pregnancy Complications: gestational diabetes  Preterm labor management: no treatment necessary Dating:  4746w2d PNL Needed:  glucose FWB:  good PTL:  none

## 2013-12-25 LAB — GLUCOSE, CAPILLARY
Glucose-Capillary: 175 mg/dL — ABNORMAL HIGH (ref 70–99)
Glucose-Capillary: 187 mg/dL — ABNORMAL HIGH (ref 70–99)
Glucose-Capillary: 200 mg/dL — ABNORMAL HIGH (ref 70–99)

## 2013-12-25 MED ORDER — INSULIN NPH (HUMAN) (ISOPHANE) 100 UNIT/ML ~~LOC~~ SUSP
20.0000 [IU] | Freq: Every day | SUBCUTANEOUS | Status: DC
  Administered 2013-12-26 – 2013-12-28 (×3): 20 [IU] via SUBCUTANEOUS

## 2013-12-25 MED ORDER — INSULIN NPH (HUMAN) (ISOPHANE) 100 UNIT/ML ~~LOC~~ SUSP
25.0000 [IU] | Freq: Every day | SUBCUTANEOUS | Status: DC
  Administered 2013-12-25 – 2013-12-27 (×3): 25 [IU] via SUBCUTANEOUS

## 2013-12-25 NOTE — Progress Notes (Addendum)
Patient ID: Felicia Holt, female   DOB: 29-Jun-1996, 18 y.o.   MRN: [id number]  Hospital Day: 2  S: Preterm labor symptoms: None. Denies LOF, bleeding, contractions. Reports + fetal movement.   Patient states her blood glucose was to high due to eating to much bread last night.  Patient has no questions.  O: Blood pressure 114/51, pulse 90, temperature 98.1 F (36.7 C), temperature source Oral, resp. rate 18, height 5\' 2"  (1.575 m), weight 245 lb (111.131 kg). Filed Vitals:   12/24/13 2007 12/25/13 0000 12/25/13 0805 12/25/13 0806  BP: 109/58 121/64  97/51  Pulse: 105 105  93  Temp: 98.6 F (37 C) 97.9 F (36.6 C) 98 F (36.7 C)   TempSrc: Oral Oral Oral   Resp: 18 20  20   Height:      Weight:       CBG (last 3)   Recent Labs  12/24/13 1558 12/24/13 2341 12/25/13 0645  GLUCAP 180* 329* 175*       NWG:NFAOZHYQFHT:Baseline: 150 bpm, +accel, neg decel, mod variability Toco: None SVE: not done  A/P- 18 y.o. admitted with:  Abnormal glucose at 34 weeks.  Admitted for glucose management.  Diabetic teaching done Elevated blood glucose throughout the night and elevated FBG. Will consult for change w/ MD for further plan of care. US done 6/24, results pending Cat 1 FHR  Present on Admission:  **None**  Pregnancy Complications: gestational diabetes  Preterm labor management: no treatment necessary Dating:  183w2d PNL Needed:  glucose FWB:  good PTL:  none

## 2013-12-25 NOTE — Progress Notes (Signed)
Inpatient Diabetes Program Recommendations  AACE/ADA: New Consensus Statement on Inpatient Glycemic Control (2013)  Target Ranges:  Prepandial:   less than 140 mg/dL      Peak postprandial:   less than 180 mg/dL (1-2 hours)      Critically ill patients:  140 - 180 mg/dL   Reason for Assessment: Uncontrolled Gestational Diabetes  Inpatient Diabetes Program Recommendations Insulin - Basal: Please consider increasing NPH to 30 units bid Correction (SSI): x Insulin - Meal Coverage: Consider increasing Novolog meal coverage 15 units tid with meals   Note: Spoke to Charity fundraiserN.  She states that patient has been giving her insulin injections.  CBG's continue to be uncontrolled.  May consider insulin pen at discharge for NPH and Novolog for convenience and to improve compliance.  If insulin pens ordered, will also need insulin pen needles.  Will follow-up this afternoon regarding CBG levels and patient education.  Thanks, Beryl MeagerJenny Maleeha Halls, RN, BC-ADM Inpatient Diabetes Coordinator Pager [phone]

## 2013-12-25 NOTE — Progress Notes (Signed)
Spoke to patient regarding gestational diabetes.  She was able to appropriately teach back CBG goals that were discussed 12/24/13.  Mom was in the room.  Demonstrated use of insulin pen with patient.  She was able to demonstrate 2 unit prime, proper hold, and holding injection for 10 seconds.  CBG's continue to be elevated.  MD please consider ordering insulin pens at discharge for convenience.  She seems to be doing well with education.  Encouraged Mom to also read gestational diabetes information.    Beryl MeagerJenny Simpson, RN, BC-ADM Inpatient Diabetes Coordinator Pager [phone]

## 2013-12-26 LAB — URINE CULTURE: Colony Count: 70000

## 2013-12-26 LAB — GLUCOSE, CAPILLARY
Glucose-Capillary: 105 mg/dL — ABNORMAL HIGH (ref 70–99)
Glucose-Capillary: 121 mg/dL — ABNORMAL HIGH (ref 70–99)
Glucose-Capillary: 128 mg/dL — ABNORMAL HIGH (ref 70–99)
Glucose-Capillary: 141 mg/dL — ABNORMAL HIGH (ref 70–99)
Glucose-Capillary: 148 mg/dL — ABNORMAL HIGH (ref 70–99)

## 2013-12-26 LAB — TYPE AND SCREEN
ABO/RH(D): AB POS
ANTIBODY SCREEN: NEGATIVE

## 2013-12-26 NOTE — Progress Notes (Signed)
Inpatient Diabetes Program Recommendations  AACE/ADA: New Consensus Statement on Inpatient Glycemic Control (2013)  Target Ranges:  Prepandial:   less than 140 mg/dL      Peak postprandial:   less than 180 mg/dL (1-2 hours)      Critically ill patients:  140 - 180 mg/dL   Note:  CBG's are generally improving.  In looking at CBG's thus far today, request MD consider increasing HS dose of NPH.   Thank you.  Buelah Rennie S. Elsie Lincolnouth, RN, CNS, CDE Inpatient Diabetes Program, team pager [phone]

## 2013-12-26 NOTE — Progress Notes (Signed)
Pt refusing to have blood drawn by lab tech. On phone with mother stating that she was told that she would not have to be stuck again once IV was started.  Venous blood for T&S drawn from Saline lock.  Tubing and dressing changed.  Pt also refusing to be placed on fetal monitor at this time,.  Agreed to be monitored at 2330.

## 2013-12-26 NOTE — Progress Notes (Signed)
Patient ID: Felicia ChristenJanai Holt, female   DOB: 09-04-1995, 18 y.o.   MRN: [id number] Hospital Day: 4  S: No complaints.  O: Blood pressure 112/63, pulse 91, temperature 97.9 F (36.6 C), temperature source Oral, resp. rate 20, height 5\' 2"  (1.575 m), weight 245 lb (111.131 kg).   BJY:NWGNFAOZFHT:Baseline: 150 bpm Toco: None SVE:   A/P- 18 y.o. admitted with:  Gestational diabetes for insulin management.  Still has elevated glucose levels.  Continue insulin adjustment.  Present on Admission:  **None**  Pregnancy Complications: gestational diabetes  Preterm labor management: no treatment necessary Dating:  287w4d PNL Needed:  none FWB:  good PTL:  none

## 2013-12-26 NOTE — Progress Notes (Signed)
Pt angry threatening to leave AMA.  Room door opened and pt is yelling at her mother.  Sounds upset that Mother is leaving and not staying overnight.  Pt packed personal belonging bag and put on shoes and took off wearing pt gown and saline lock, saying she was not staying and was going home.  Pt asked to allow nurse to DC her IV.  Pt refused and  walked passed nurse to and through MBE, back through Ante and through L&D before returning to room followed by house coverage.  Pt crying and sitting on side of bed.  House Coverage at bedside.  Pt's mother remaining at bedside.

## 2013-12-26 NOTE — Progress Notes (Signed)
Lights out in pt's room.  Pt would not acknowledge that I was there to monitor Fetal heart rate.  Respect pt's silence and allowed to rest.

## 2013-12-27 LAB — GLUCOSE, CAPILLARY
GLUCOSE-CAPILLARY: 88 mg/dL (ref 70–99)
Glucose-Capillary: 86 mg/dL (ref 70–99)
Glucose-Capillary: 131 mg/dL — ABNORMAL HIGH (ref 70–99)
Glucose-Capillary: 104 mg/dL — ABNORMAL HIGH (ref 70–99)

## 2013-12-27 NOTE — Progress Notes (Deleted)
Felicia ChristenJanai Holt is a 18 y.o. G1P0 at 5819w5d by LMP admitted for UC's.  Subjective:   Objective: BP 125/66  Pulse 100  Temp(Src) 98.5 F (36.9 C) (Oral)  Resp 18  Ht 5\' 2"  (1.575 m)  Wt 245 lb (111.131 kg)  BMI 44.80 kg/m2      FHT:  FHR: 140 bpm, variability: moderate,  accelerations:  Present,  decelerations:  Absent UC:   Irregular, mild  SVE:    1cm / thick / posterior / -3 / Vtx  Labs: Lab Results  Component Value Date   WBC 8.5 12/23/2013   HGB 10.8* 12/23/2013   HCT 31.1* 12/23/2013   MCV 79.7 12/23/2013   PLT 227 12/23/2013    Assessment / Plan: Admitted with strong UC's at [redacted] weeks gestation.  Contractions have significantly ceased over prolonged period of observation.  Patient not in labor.  Will discharge home.  Labor: none Preeclampsia:  n/a Fetal Wellbeing:  Category I Pain Control:  Bubain I/D:  n/a   HARPER,CHARLES A 12/27/2013, 7:06 AM

## 2013-12-27 NOTE — Progress Notes (Signed)
Patient ID: Felicia ChristenJanai Holt, female   DOB: 29-Jul-1995, 18 y.o.   MRN: [id number] Hospital Day: 5 S:  No complaints. O: Blood pressure 113/58, pulse 94, temperature 98.3 F (36.8 C), temperature source Oral, resp. rate 20, height 5\' 2"  (1.575 m), weight 245 lb (111.131 kg).   HQI:ONGEXBMWFHT:Baseline: 150 bpm Toco: None SVE:   A/P- 18 y.o. admitted with:  Abnormal glucose levels.  FBS = 86mg /dl.  Slowly gaining better glucose control.  Continue present management./  Present on Admission:  **None**  Pregnancy Complications: gestational diabetes  Preterm labor management: no treatment necessary Dating:  3469w5d PNL Needed:  none FWB:  Good. PTL:  none

## 2013-12-28 LAB — GLUCOSE, CAPILLARY
Glucose-Capillary: 97 mg/dL (ref 70–99)
Glucose-Capillary: 126 mg/dL — ABNORMAL HIGH (ref 70–99)

## 2013-12-28 MED ORDER — INSULIN ASPART 100 UNIT/ML ~~LOC~~ SOLN: 12.0000 [IU] | Freq: Three times a day (TID) | SUBCUTANEOUS | Status: DC

## 2013-12-28 MED ORDER — INSULIN ASPART 100 UNIT/ML ~~LOC~~ SOLN: 0.0000 [IU] | Freq: Three times a day (TID) | SUBCUTANEOUS | Status: DC

## 2013-12-28 MED ORDER — ACCU-CHEK SOFTCLIX LANCETS MISC: Status: DC

## 2013-12-28 MED ORDER — INSULIN NPH (HUMAN) (ISOPHANE) 100 UNIT/ML ~~LOC~~ SUSP: 25.0000 [IU] | Freq: Every day | SUBCUTANEOUS | Status: DC

## 2013-12-28 MED ORDER — GLUCOSE BLOOD VI STRP: ORAL_STRIP | Status: DC

## 2013-12-28 MED ORDER — INSULIN NPH (HUMAN) (ISOPHANE) 100 UNIT/ML ~~LOC~~ SUSP: 20.0000 [IU] | Freq: Every day | SUBCUTANEOUS | Status: DC

## 2013-12-28 NOTE — Discharge Instructions (Signed)
° ° °   Sliding Scale---Novolog----2 hours after meals       If your blood sugar is: Take this amount of insulin           CBG < 60: call physician     CBG 70 - 90 (dose in units): 0           CBG 91 - 120 (dose in units): 4     CBG 121 - 160 (dose in units):    5     CBG 161 - 200 (dose in units):    8     CBG 201 - 240 (dose in units): 12     CBG 241 - 280 (dose in units): 16     CBG 281 - 320 (dose in units): 20     CBG 321 - 360 (dose in units): 24     CBG 361 - 400 (dose in units): 28      CBG > 400 32 units and call physician        NPH insulin 20 units every morning with breakfast  Novolog insulin 12 units with breakfast  Novolog insulin 12 units with lunch  Novolog insulin 12 units with supper  NPH insulin 25 units every night at bedtime

## 2013-12-28 NOTE — Discharge Summary (Signed)
Physician Discharge Summary  Patient ID: Felicia Holt MRN: [id number] DOB/AGE: 1995-11-20 18 y.o.  Admit date: 12/23/2013 Discharge date: 12/28/2013  Admission Diagnoses:  34 weeks.  Gestational Diabetes.  Poor glucose control.  Discharge Diagnoses:  Same.  Improved glucose control. Active Problems:   Gestational diabetes   Discharged Condition: good  Hospital Course: Admitted for insulin management and glucose control with gestational diabetes, on insulin.  Responded well to therapy.  Discharged home with good glucose control.  Consults: None  Significant Diagnostic Studies: labs: capillary blood blucose  Treatments: insulin: Humalog  Discharge Exam: Blood pressure 117/61, pulse 78, temperature 97.5 F (36.4 C), temperature source Axillary, resp. rate 18, height 5' 2"  (1.575 m), weight 245 lb (111.131 kg). General appearance: alert and no distress GI: normal findings: soft, non-tender Extremities: extremities normal, atraumatic, no cyanosis or edema  Disposition: 01-Home or Self Care  Discharge Instructions   Discharge diet:    Complete by:  As directed             Medication List         ACCU-CHEK AVIVA PLUS W/DEVICE Kit  1 Device by Does not apply route once.     ACCU-CHEK SOFTCLIX LANCETS lancets  Use as instructed     glucose blood test strip  Use as instructed     insulin aspart 100 UNIT/ML injection  Commonly known as:  novoLOG  Inject 0-32 Units into the skin 3 (three) times daily.     insulin aspart 100 UNIT/ML injection  Commonly known as:  novoLOG  Inject 12 Units into the skin 3 (three) times daily with meals.     insulin NPH Human 100 UNIT/ML injection  Commonly known as:  HUMULIN N,NOVOLIN N  Inject 0.2 mLs (20 Units total) into the skin daily before breakfast.     insulin NPH Human 100 UNIT/ML injection  Commonly known as:  HUMULIN N,NOVOLIN N  Inject 0.25 mLs (25 Units total) into the skin at bedtime.     prenatal multivitamin Tabs  tablet  Take 1 tablet by mouth daily at 12 noon.           Follow-up Information   Follow up with Lahoma Crocker A, MD. Schedule an appointment as soon as possible for a visit in 3 days.   Specialty:  Obstetrics and Gynecology   Contact information:   [address] [place] [address] [phone]       Signed: Shelly Bombard 12/28/2013, 8:44 AM

## 2013-12-31 ENCOUNTER — Ambulatory Visit (INDEPENDENT_AMBULATORY_CARE_PROVIDER_SITE_OTHER): Payer: Medicaid Other | Admitting: Obstetrics & Gynecology

## 2013-12-31 ENCOUNTER — Encounter: Payer: Self-pay | Admitting: Obstetrics & Gynecology

## 2013-12-31 VITALS — BP 113/76 | HR 96 | Temp 98.0°F | Wt 257.0 lb

## 2013-12-31 DIAGNOSIS — O0990 Supervision of high risk pregnancy, unspecified, unspecified trimester: Secondary | ICD-10-CM

## 2013-12-31 DIAGNOSIS — O99810 Abnormal glucose complicating pregnancy: Secondary | ICD-10-CM

## 2013-12-31 LAB — OB RESULTS CONSOLE GC/CHLAMYDIA
Chlamydia: POSITIVE
Gonorrhea: NEGATIVE

## 2013-12-31 LAB — POCT URINALYSIS DIPSTICK
BILIRUBIN UA: NEGATIVE
Blood, UA: NEGATIVE
Glucose, UA: 1000
Ketones, UA: NEGATIVE
LEUKOCYTES UA: NEGATIVE
Nitrite, UA: NEGATIVE
Spec Grav, UA: 1.015
UROBILINOGEN UA: NEGATIVE
pH, UA: 6

## 2013-12-31 LAB — GLUCOSE, POCT (MANUAL RESULT ENTRY): POC Glucose: 180 mg/dl — AB (ref 70–99)

## 2013-12-31 NOTE — Addendum Note (Signed)
Addended by: Marya LandryFOSTER, Odile Veloso D on: 12/31/2013 05:47 PM   Modules accepted: Orders

## 2013-12-31 NOTE — Patient Instructions (Signed)
Patient information: Group B streptococcus and pregnancy (Beyond the Basics)  Authors Karen M Puopolo, MD, PhD Carol J Baker, MD Section Editors Charles J Lockwood, MD Daniel J Sexton, MD Deputy Editor Vanessa A Barss, MD Disclosures  All topics are updated as new evidence becomes available and our peer review process is complete.  Literature review current through: Feb 2014.  This topic last updated: Dec 31, 2011.  INTRODUCTION - Group B streptococcus (GBS) is a bacterium that can cause serious infections in pregnant women and newborn babies. GBS is one of many types of streptococcal bacteria, sometimes called "strep." This article discusses GBS, its effect on pregnant women and infants, and ways to prevent complications of GBS. More detailed information about GBS is available by subscription. (See "Group B streptococcal infection in pregnant women".) WHAT IS GROUP B STREP INFECTION? - GBS is commonly found in the digestive system and the vagina. In healthy adults, GBS is not harmful and does not cause problems. But in pregnant women and newborn infants, being infected with GBS can cause serious illness. Approximately one in three to four pregnant women in the [place] carries GBS in their gastrointestinal system and/or in their vagina. Carrying GBS is not the same as being infected. Carriers are not sick and do not need treatment during pregnancy. There is no treatment that can stop you from carrying GBS.  Pregnant women who are carriers of GBS infrequently become infected with GBS. GBS can cause urinary tract infections, infection of the amniotic fluid (bag of water), and infection of the uterus after delivery. GBS infections during pregnancy may lead to preterm labor.  Pregnant women who carry GBS can pass on the bacteria to their newborns, and some of those babies become infected with GBS. Newborns who are infected with GBS can develop pneumonia (lung infection), septicemia (blood infection), or  meningitis (infection of the lining of the brain and spinal cord). These complications can be prevented by giving intravenous antibiotics during labor to any woman who is at risk of GBS infection. You are at risk of GBS infection if: You have a urine culture during your current pregnancy showing GBS  You have a vaginal and rectal culture during your current pregnancy showing GBS  You had an infant infected with GBS in the past GROUP B STREP PREVENTION - Most doctors and nurses recommend a urine culture early in your pregnancy to be sure that you do not have a bladder infection without symptoms. If you urine culture shows GBS or other bacteria, you may be treated with an antibiotic. If you have symptoms of urinary infection, such as pain with urination, any time during your pregnancy, a urine culture is done. If GBS grows from the urine culture, it should be treated with an antibiotic, and you should also receive intravenous antibiotics during labor. Expert groups recommend that all pregnant women have a GBS culture at 35 to 37 weeks of pregnancy. The culture is done by swabbing the vagina and rectum. If your GBS culture is positive, you will be given an intravenous antibiotic during labor. If you have preterm labor, the culture is done then and an intravenous antibiotic is given until the baby is born or the labor is stopped by your health care provider. If you have a positive GBS culture and you have an allergy to penicillin, be sure your doctor and nurse are aware of this allergy and tell them what happened with the allergy. If you had only a rash or itching, this   is not a serious allergy, and you can receive a common drug related to the penicillin. If you had a serious allergy (for example, trouble breathing, swelling of your face) you may need an additional test to determine which antibiotic should be used during labor. Being treated with an antibiotic during labor greatly reduces the chance that you or  your newborn will develop infections related to GBS. It is important to note that young infants up to age 3 months can also develop septicemia, meningitis and other serious infections from GBS. Being treated with an antibiotic during labor does not reduce the chance that your baby will develop this later type of infection. There is currently no known way of preventing this later-onset GBS disease. WHERE TO GET MORE INFORMATION - Your healthcare provider is the best source of information for questions and concerns related to your medical problem.  

## 2013-12-31 NOTE — Progress Notes (Signed)
Subjective:    Felicia ChristenJanai Holt is a 18 y.o. female being seen today for her obstetrical visit. She is at 6043w2d  gestation. Patient reports no complaints. Fetal movement: normal.  Forgot CBG record.  Per pt CBGs in range.  Random CBG today 180.  Problem List Items Addressed This Visit   Unspecified high-risk pregnancy - Primary   Relevant Orders      POCT Glucose (CBG) (Completed)      GC/Chlamydia Probe Amp      Strep B DNA probe     Patient Active Problem List   Diagnosis Date Noted  . Depression 11/25/2013    Priority: High  . Gestational diabetes 11/15/2013    Priority: High  . Obesity complicating pregnancy 10/12/2013    Priority: High  . Unspecified high-risk pregnancy 07/08/2013   Objective:    BP 113/76  Pulse 96  Temp(Src) 98 F (36.7 C)  Wt 116.574 kg (257 lb) FHT:  140 BPM  Uterine Size: size equals dates  Presentation: cephalic     Assessment:    Pregnancy @ 3943w2d weeks  Insulin-requiring GDM--?control Plan:     labs reviewed, problem list updated Referral-->[facility] Orders Placed This Encounter  Procedures  . GC/Chlamydia Probe Amp  . Strep B DNA probe  . POCT Glucose (CBG)    Follow up in several days.

## 2014-01-01 LAB — GC/CHLAMYDIA PROBE AMP
CT Probe RNA: POSITIVE — AB
GC PROBE AMP APTIMA: NEGATIVE

## 2014-01-02 LAB — STREP B DNA PROBE: GBSP: POSITIVE

## 2014-01-03 ENCOUNTER — Encounter: Payer: Self-pay | Admitting: Obstetrics & Gynecology

## 2014-01-03 DIAGNOSIS — O99820 Streptococcus B carrier state complicating pregnancy: Secondary | ICD-10-CM | POA: Insufficient documentation

## 2014-01-03 DIAGNOSIS — A749 Chlamydial infection, unspecified: Secondary | ICD-10-CM | POA: Insufficient documentation

## 2014-01-03 DIAGNOSIS — O98819 Other maternal infectious and parasitic diseases complicating pregnancy, unspecified trimester: Secondary | ICD-10-CM

## 2014-01-04 ENCOUNTER — Ambulatory Visit (INDEPENDENT_AMBULATORY_CARE_PROVIDER_SITE_OTHER): Payer: Medicaid Other | Admitting: Obstetrics & Gynecology

## 2014-01-04 ENCOUNTER — Encounter: Payer: Self-pay | Admitting: Obstetrics & Gynecology

## 2014-01-04 VITALS — BP 117/79 | HR 91 | Temp 98.3°F | Wt 256.0 lb

## 2014-01-04 DIAGNOSIS — O24414 Gestational diabetes mellitus in pregnancy, insulin controlled: Secondary | ICD-10-CM

## 2014-01-04 DIAGNOSIS — Z3403 Encounter for supervision of normal first pregnancy, third trimester: Secondary | ICD-10-CM

## 2014-01-04 DIAGNOSIS — O98313 Other infections with a predominantly sexual mode of transmission complicating pregnancy, third trimester: Secondary | ICD-10-CM

## 2014-01-04 DIAGNOSIS — Z3400 Encounter for supervision of normal first pregnancy, unspecified trimester: Secondary | ICD-10-CM

## 2014-01-04 DIAGNOSIS — O99810 Abnormal glucose complicating pregnancy: Secondary | ICD-10-CM

## 2014-01-04 DIAGNOSIS — A749 Chlamydial infection, unspecified: Secondary | ICD-10-CM

## 2014-01-04 DIAGNOSIS — O98519 Other viral diseases complicating pregnancy, unspecified trimester: Secondary | ICD-10-CM

## 2014-01-04 LAB — POCT URINALYSIS DIPSTICK
Bilirubin, UA: NEGATIVE
Blood, UA: NEGATIVE
GLUCOSE UA: NEGATIVE
Ketones, UA: NEGATIVE
NITRITE UA: NEGATIVE
Protein, UA: NEGATIVE
Spec Grav, UA: 1.01
Urobilinogen, UA: NEGATIVE
pH, UA: 7

## 2014-01-04 MED ORDER — AZITHROMYCIN 250 MG PO TABS: 1000.0000 mg | ORAL_TABLET | Freq: Once | ORAL | Status: DC

## 2014-01-04 NOTE — Addendum Note (Signed)
Addended by: Odessa FlemingBOHNE, Duane Earnshaw M on: 01/04/2014 04:44 PM   Modules accepted: Orders

## 2014-01-04 NOTE — Progress Notes (Signed)
Subjective:    Felicia ChristenJanai Holt is a 18 y.o. female being seen today for her obstetrical visit. She is at 3056w6d   gestation. Patient reports no complaints. Fetal movement: normal.  Forgot CBG record.  Fasting CBGs 110s.  Problem List Items Addressed This Visit   None    Visit Diagnoses   Encounter for supervision of normal first pregnancy in third trimester    -  Primary    Relevant Orders       POCT urinalysis dipstick (Completed)       POCT urinalysis dipstick (Completed)    Chlamydia infection, current pregnancy, third trimester        Relevant Medications       azithromycin (ZITHROMAX) tablet      Patient Active Problem List   Diagnosis Date Noted  . Depression 11/25/2013    Priority: High  . Gestational diabetes 11/15/2013    Priority: High  . Obesity complicating pregnancy 10/12/2013    Priority: High  . Chlamydia infection during pregnancy, antepartum 01/03/2014  . GBS (group B Streptococcus carrier), +RV culture, currently pregnant 01/03/2014  . Unspecified high-risk pregnancy 07/08/2013   Objective:    BP 117/79  Pulse 91  Temp(Src) 98.3 F (36.8 C)  Wt 116.121 kg (256 lb) FHT:  140 BPM  Uterine Size: size greater than dates  Presentation: cephalic     Assessment:    Pregnancy @ 1856w6d weeks  Insulin-requiring GDM--?control ?Depression Plan:     labs reviewed, problem list [facility] for GDM/psychiatric support Adjust NPH at bedtime Orders Placed This Encounter  Procedures  . POCT urinalysis dipstick  . POCT urinalysis dipstick    Follow up in several days.

## 2014-01-04 NOTE — Patient Instructions (Signed)
Patient information: Group B streptococcus and pregnancy (Beyond the Basics)  Authors Karen M Puopolo, MD, PhD Carol J Baker, MD Section Editors Charles J Lockwood, MD Daniel J Sexton, MD Deputy Editor Vanessa A Barss, MD Disclosures  All topics are updated as new evidence becomes available and our peer review process is complete.  Literature review current through: Feb 2014.  This topic last updated: Dec 31, 2011.  INTRODUCTION - Group B streptococcus (GBS) is a bacterium that can cause serious infections in pregnant women and newborn babies. GBS is one of many types of streptococcal bacteria, sometimes called "strep." This article discusses GBS, its effect on pregnant women and infants, and ways to prevent complications of GBS. More detailed information about GBS is available by subscription. (See "Group B streptococcal infection in pregnant women".) WHAT IS GROUP B STREP INFECTION? - GBS is commonly found in the digestive system and the vagina. In healthy adults, GBS is not harmful and does not cause problems. But in pregnant women and newborn infants, being infected with GBS can cause serious illness. Approximately one in three to four pregnant women in the [place] carries GBS in their gastrointestinal system and/or in their vagina. Carrying GBS is not the same as being infected. Carriers are not sick and do not need treatment during pregnancy. There is no treatment that can stop you from carrying GBS.  Pregnant women who are carriers of GBS infrequently become infected with GBS. GBS can cause urinary tract infections, infection of the amniotic fluid (bag of water), and infection of the uterus after delivery. GBS infections during pregnancy may lead to preterm labor.  Pregnant women who carry GBS can pass on the bacteria to their newborns, and some of those babies become infected with GBS. Newborns who are infected with GBS can develop pneumonia (lung infection), septicemia (blood infection), or  meningitis (infection of the lining of the brain and spinal cord). These complications can be prevented by giving intravenous antibiotics during labor to any woman who is at risk of GBS infection. You are at risk of GBS infection if: You have a urine culture during your current pregnancy showing GBS  You have a vaginal and rectal culture during your current pregnancy showing GBS  You had an infant infected with GBS in the past GROUP B STREP PREVENTION - Most doctors and nurses recommend a urine culture early in your pregnancy to be sure that you do not have a bladder infection without symptoms. If you urine culture shows GBS or other bacteria, you may be treated with an antibiotic. If you have symptoms of urinary infection, such as pain with urination, any time during your pregnancy, a urine culture is done. If GBS grows from the urine culture, it should be treated with an antibiotic, and you should also receive intravenous antibiotics during labor. Expert groups recommend that all pregnant women have a GBS culture at 35 to 37 weeks of pregnancy. The culture is done by swabbing the vagina and rectum. If your GBS culture is positive, you will be given an intravenous antibiotic during labor. If you have preterm labor, the culture is done then and an intravenous antibiotic is given until the baby is born or the labor is stopped by your health care provider. If you have a positive GBS culture and you have an allergy to penicillin, be sure your doctor and nurse are aware of this allergy and tell them what happened with the allergy. If you had only a rash or itching, this   is not a serious allergy, and you can receive a common drug related to the penicillin. If you had a serious allergy (for example, trouble breathing, swelling of your face) you may need an additional test to determine which antibiotic should be used during labor. Being treated with an antibiotic during labor greatly reduces the chance that you or  your newborn will develop infections related to GBS. It is important to note that young infants up to age 3 months can also develop septicemia, meningitis and other serious infections from GBS. Being treated with an antibiotic during labor does not reduce the chance that your baby will develop this later type of infection. There is currently no known way of preventing this later-onset GBS disease. WHERE TO GET MORE INFORMATION - Your healthcare provider is the best source of information for questions and concerns related to your medical problem.  

## 2014-01-05 LAB — GLUCOSE, RANDOM: GLUCOSE: 71 mg/dL (ref 70–99)

## 2014-01-07 ENCOUNTER — Ambulatory Visit (INDEPENDENT_AMBULATORY_CARE_PROVIDER_SITE_OTHER): Payer: Medicaid Other | Admitting: Obstetrics & Gynecology

## 2014-01-07 ENCOUNTER — Encounter: Payer: Self-pay | Admitting: Obstetrics & Gynecology

## 2014-01-07 VITALS — BP 115/76 | HR 88 | Temp 98.2°F | Wt 257.0 lb

## 2014-01-07 DIAGNOSIS — Z3483 Encounter for supervision of other normal pregnancy, third trimester: Secondary | ICD-10-CM

## 2014-01-07 DIAGNOSIS — Z3480 Encounter for supervision of other normal pregnancy, unspecified trimester: Secondary | ICD-10-CM

## 2014-01-07 DIAGNOSIS — O99810 Abnormal glucose complicating pregnancy: Secondary | ICD-10-CM

## 2014-01-08 NOTE — Progress Notes (Signed)
Subjective:    Felicia ChristenJanai Holt is a 18 y.o. female being seen today for her obstetrical visit. She is at 2667w2d   gestation. Patient reports no complaints. Fetal movement: normal.  CBGs in range.  Problem List Items Addressed This Visit   None    Visit Diagnoses   Encounter for supervision of other normal pregnancy in third trimester    -  Primary    Relevant Orders       POCT urinalysis dipstick      Patient Active Problem List   Diagnosis Date Noted  . Depression 11/25/2013    Priority: High  . Gestational diabetes 11/15/2013    Priority: High  . Obesity complicating pregnancy 10/12/2013    Priority: High  . Chlamydia infection during pregnancy, antepartum 01/03/2014  . GBS (group B Streptococcus carrier), +RV culture, currently pregnant 01/03/2014  . Unspecified high-risk pregnancy 07/08/2013   Objective:    BP 115/76  Pulse 88  Temp(Src) 98.2 F (36.8 C)  Wt 116.574 kg (257 lb) FHT:  140 BPM  Uterine Size: size greater than dates  Presentation: cephalic     Assessment:    Pregnancy @ 6367w2d weeks  Insulin-requiring GDM--?control  Plan:     labs reviewed, problem list updated  Orders Placed This Encounter  Procedures  . POCT urinalysis dipstick    Follow up in several days.

## 2014-01-08 NOTE — Patient Instructions (Signed)
Patient information: Group B streptococcus and pregnancy (Beyond the Basics)  Authors Karen M Puopolo, MD, PhD Carol J Baker, MD Section Editors Charles J Lockwood, MD Daniel J Sexton, MD Deputy Editor Vanessa A Barss, MD Disclosures  All topics are updated as new evidence becomes available and our peer review process is complete.  Literature review current through: Feb 2014.  This topic last updated: Dec 31, 2011.  INTRODUCTION - Group B streptococcus (GBS) is a bacterium that can cause serious infections in pregnant women and newborn babies. GBS is one of many types of streptococcal bacteria, sometimes called "strep." This article discusses GBS, its effect on pregnant women and infants, and ways to prevent complications of GBS. More detailed information about GBS is available by subscription. (See "Group B streptococcal infection in pregnant women".) WHAT IS GROUP B STREP INFECTION? - GBS is commonly found in the digestive system and the vagina. In healthy adults, GBS is not harmful and does not cause problems. But in pregnant women and newborn infants, being infected with GBS can cause serious illness. Approximately one in three to four pregnant women in the [place] carries GBS in their gastrointestinal system and/or in their vagina. Carrying GBS is not the same as being infected. Carriers are not sick and do not need treatment during pregnancy. There is no treatment that can stop you from carrying GBS.  Pregnant women who are carriers of GBS infrequently become infected with GBS. GBS can cause urinary tract infections, infection of the amniotic fluid (bag of water), and infection of the uterus after delivery. GBS infections during pregnancy may lead to preterm labor.  Pregnant women who carry GBS can pass on the bacteria to their newborns, and some of those babies become infected with GBS. Newborns who are infected with GBS can develop pneumonia (lung infection), septicemia (blood infection), or  meningitis (infection of the lining of the brain and spinal cord). These complications can be prevented by giving intravenous antibiotics during labor to any woman who is at risk of GBS infection. You are at risk of GBS infection if: You have a urine culture during your current pregnancy showing GBS  You have a vaginal and rectal culture during your current pregnancy showing GBS  You had an infant infected with GBS in the past GROUP B STREP PREVENTION - Most doctors and nurses recommend a urine culture early in your pregnancy to be sure that you do not have a bladder infection without symptoms. If you urine culture shows GBS or other bacteria, you may be treated with an antibiotic. If you have symptoms of urinary infection, such as pain with urination, any time during your pregnancy, a urine culture is done. If GBS grows from the urine culture, it should be treated with an antibiotic, and you should also receive intravenous antibiotics during labor. Expert groups recommend that all pregnant women have a GBS culture at 35 to 37 weeks of pregnancy. The culture is done by swabbing the vagina and rectum. If your GBS culture is positive, you will be given an intravenous antibiotic during labor. If you have preterm labor, the culture is done then and an intravenous antibiotic is given until the baby is born or the labor is stopped by your health care provider. If you have a positive GBS culture and you have an allergy to penicillin, be sure your doctor and nurse are aware of this allergy and tell them what happened with the allergy. If you had only a rash or itching, this   is not a serious allergy, and you can receive a common drug related to the penicillin. If you had a serious allergy (for example, trouble breathing, swelling of your face) you may need an additional test to determine which antibiotic should be used during labor. Being treated with an antibiotic during labor greatly reduces the chance that you or  your newborn will develop infections related to GBS. It is important to note that young infants up to age 20 months can also develop septicemia, meningitis and other serious infections from GBS. Being treated with an antibiotic during labor does not reduce the chance that your baby will develop this later type of infection. There is currently no known way of preventing this later-onset GBS disease. WHERE TO GET MORE INFORMATION - Your healthcare provider is the best source of information for questions and concerns related to your medical problem.  

## 2014-01-11 ENCOUNTER — Encounter: Payer: Self-pay | Admitting: Obstetrics & Gynecology

## 2014-01-11 ENCOUNTER — Ambulatory Visit (INDEPENDENT_AMBULATORY_CARE_PROVIDER_SITE_OTHER): Payer: Medicaid Other | Admitting: Obstetrics & Gynecology

## 2014-01-11 VITALS — BP 126/88 | HR 98 | Temp 98.0°F | Wt 255.0 lb

## 2014-01-11 DIAGNOSIS — Z3400 Encounter for supervision of normal first pregnancy, unspecified trimester: Secondary | ICD-10-CM

## 2014-01-11 DIAGNOSIS — Z3403 Encounter for supervision of normal first pregnancy, third trimester: Secondary | ICD-10-CM

## 2014-01-11 DIAGNOSIS — O99810 Abnormal glucose complicating pregnancy: Secondary | ICD-10-CM

## 2014-01-11 DIAGNOSIS — O24414 Gestational diabetes mellitus in pregnancy, insulin controlled: Secondary | ICD-10-CM

## 2014-01-11 LAB — POCT URINALYSIS DIPSTICK
Bilirubin, UA: NEGATIVE
Blood, UA: NEGATIVE
Glucose, UA: NEGATIVE
KETONES UA: NEGATIVE
NITRITE UA: NEGATIVE
PH UA: 7
Spec Grav, UA: <=1.005
Urobilinogen, UA: NEGATIVE

## 2014-01-11 LAB — GLUCOSE, POCT (MANUAL RESULT ENTRY): POC Glucose: 139 mg/dl — AB (ref 70–99)

## 2014-01-11 NOTE — Addendum Note (Signed)
Addended by: Marya LandryFOSTER, Perla Echavarria D on: 01/11/2014 04:36 PM   Modules accepted: Orders

## 2014-01-11 NOTE — Progress Notes (Signed)
Subjective:    Donna ChristenJanai Lakins is a 18 y.o. female being seen today for her obstetrical visit. She is at 5958w6d    gestation. Patient reports vaginal irritation and dysuria. Fetal movement: normal.  CBGs in range.  Problem List Items Addressed This Visit   None    Visit Diagnoses   Encounter for supervision of normal first pregnancy in third trimester    -  Primary    Relevant Orders       POCT urinalysis dipstick    Insulin controlled gestational diabetes mellitus in third trimester        Relevant Orders       POCT Glucose (CBG) (Completed)      Patient Active Problem List   Diagnosis Date Noted  . Depression 11/25/2013    Priority: High  . Gestational diabetes 11/15/2013    Priority: High  . Obesity complicating pregnancy 10/12/2013    Priority: High  . Chlamydia infection during pregnancy, antepartum 01/03/2014  . GBS (group B Streptococcus carrier), +RV culture, currently pregnant 01/03/2014  . Unspecified high-risk pregnancy 07/08/2013   Objective:    BP 126/88  Pulse 98  Temp(Src) 98 F (36.7 C)  Wt 115.667 kg (255 lb) FHT:  130 BPM  Uterine Size: size greater than dates  Presentation: cephalic     Assessment:    Pregnancy @ 5758w6d weeks  Insulin-requiring GDM--adequate control ?Vaginitis  Plan:     labs reviewed, problem list updated  Orders Placed This Encounter  Procedures  . POCT urinalysis dipstick  . POCT Glucose (CBG)    Follow up in several days.

## 2014-01-11 NOTE — Patient Instructions (Signed)
Sudden Infant Death Syndrome (SIDS): Sleeping Position SIDS is the sudden death of a healthy infant. The cause of SIDS is not known. However, there are certain factors that put the baby at risk, such as:  Babies placed on their stomach or side to sleep.  The baby being born earlier than normal (prematurity).  Being of African American, Native American, and [place] Native descent.  Being a female. SIDS is seen more often in female babies than in female babies.  Sleeping on a soft surface.  Overheating.  Having a mother who smokes or uses illegal drugs.  Being an infant of a mother who is very young.  Having poor prenatal care.  Babies that had a low weight at birth.  Abnormalities of the placenta, the organ that provides nutrition in the womb.  Babies born in the fall or winter months.  Recent respiratory tract infection. Although it is recommended that most babies should be put on their backs to sleep, some questions have arisen: IS THE SIDE POSITION AS EFFECTIVE AS THE BACK? The side position is not recommended because there is still an increased chance of SIDS compared to the back position. Your baby should be placed on his or her back every time he or she sleeps. ARE THERE ANY BABIES WHO SHOULD BE PLACED ON THEIR TUMMY FOR SLEEP? Babies with certain disorders have fewer problems when lying on their tummy. These babies include:  Infants with symptomatic gastroesophageal reflux (GERD). Reflux is usually less in the tummy position.  Babies with certain upper airway malformations, such as Robin syndrome. There are fewer occurrences of the airway being blocked when lying on the stomach. Before letting your baby sleep on his or her tummy, discuss with your health care provider. If your baby has one of the above problems, your health care provider will help you decide if the benefits of tummy sleeping are greater than the small increased risk for SIDS. Be sure to avoid overheating and  soft bedding as these risk factors are troublesome for belly sleeping infants. SHOULD HEALTHY BABIES EVER BE PLACED ON THE TUMMY? Having tummy time while the baby is awake is important for movement (motor) development. It can also lower the chance of a flattened head (positional plagiocephaly). Flattened head can be the result of spending too much time on their back. Tummy time when the baby is awake and watched by an adult is good for baby's development. WHICH SLEEPING POSITION IS BEST FOR A BABY BORN EARLY (PRE-TERM) AFTER LEAVING THE HOSPITAL? In the nursery, babies who are born early (pre-term) often receive care in a position lying on their backs. Once recovered and ready to leave the hospital, there is no reason to believe that they should be treated any differently than a baby who was born at term. Unless there are specific instructions to do otherwise, these babies should be placed on their backs to sleep. IN WHAT POSITION ARE FULL-TERM BABIES PUT TO SLEEP IN HOSPITAL NURSERIES? Unless there is a specific reason to do otherwise, babies are placed on their backs in hospital nurseries.  IF A BABY DOES NOT SLEEP WELL ON HIS OR HER BACK, IS IT OKAY TO TURN HIM OR HER TO A SIDE OR TUMMY POSITION? No. Because of the risk of SIDS, the side and tummy positions are not recommended. Positional preference appears to be a learned behavior among infants from birth to 4 to 6 months of age. Infants who are always placed on their backs will become used   to this position. If your baby is not sleeping well, look for possible reasons. For example, be sure to avoid overheating or the use of soft bedding. AT WHAT AGE CAN YOU STOP USING THE BACK POSITION FOR SLEEP? The peak risk for SIDS is age 13 weeks to 24 weeks. Although less common, it can occur up to 20 year of age. It is recommended that you place your baby on his or her back up to age 1 year.  DO I NEED TO KEEP CHECKING ON MY BABY AFTER LAYING HIM OR HER DOWN FOR  SLEEP IN A BACK-LYING POSITION?  No. Very young infants placed on their backs cannot roll onto their tummies. HOW SHOULD HOSPITALS PLACE BABIES DOWN FOR SLEEP IF THEY ARE READMITTED? As a general guideline, hospitalized infants should sleep on their backs just as they would at home. However, there may be a medical problem that would require a side or tummy position.  WILL BABIES ASPIRATE ON THEIR BACKS? There is no evidence that healthy babies are more likely to inhale stomach contents (have aspiration episodes) when they are on their backs. In the majority of the small number of reported cases of death due to aspiration, the infant's position at death, when known, was on their tummy. DOES SLEEPING ON THE BACK CAUSE BABIES TO HAVE FLAT HEADS? There is some suggestion that the incidence of babies developing a flat spot on their heads may have increased since the incidence of sleeping on their tummies has decreased. Usually, this is not a serious condition. This condition will disappear within several months after the baby begins to sit up. Flat spots can be avoided by altering the head position when the baby is sleeping on his or her back. Giving your baby tummy time also helps prevent the development of a flat head. SHOULD PRODUCTS BE USED TO KEEP BABIES ON THEIR BACKS OR SIDES DURING SLEEP? Although various devices have been sold to maintain babies in a back-lying position during sleep, their use is not recommended. Infants who sleep on their backs need no extra support. SHOULD SOFT SURFACES BE AVOIDED? Several studies indicate that soft sleeping surfaces increase the risk of SIDS in infants. It is unknown how soft a surface must be to pose a threat. A firm infant mattress with no more than a thin covering such as a sheet or rubberized pad between the infant and mattress is advised. Soft, plush, or bulky items, such as pillows, rolls of bedding, or cushions in the baby's sleeping environment are strongly  warned against. These items can come into close contact with the infant's face and might cause breathing problems.  DOES BED SHARING OR CO-SLEEPING DECREEASE RISK? No.Bed sharing, while controversial, is associated with an increased risk of SIDS, especially when the mother smokes, when sleeping occurs on a couch or sofa, when there are multiple bed sharers, or when bed sharers have consumed alcohol. Sleeping in an approved crib or bassinet in the same room as the mother decreases risk of SIDS. CAN A PACIFIER DECREASE RISK? While it is not known exactly how, pacifier use during the first year of life decreases the risk of SIDS. Give your baby the pacifier when putting the baby down, but do not force a pacifier or place one in your baby's mouth once your baby has fallen asleep. Pacifiers should not have any sugary solutions applied to them and need to be cleaned regularly. Finally, if your baby is breastfeeding, it is beneficial to delay use of   a pacifier in order to firmly establish breastfeeding. Document Released: 06/12/2001 Document Revised: 06/23/2013 Document Reviewed: 01/17/2009 [employer] Patient Information 2015 [employer]. This information is not intended to replace advice given to you by your health care provider. Make sure you discuss any questions you have with your health care provider.  

## 2014-01-12 LAB — WET PREP BY MOLECULAR PROBE
Candida species: POSITIVE — AB
Gardnerella vaginalis: NEGATIVE
TRICHOMONAS VAG: NEGATIVE

## 2014-01-13 ENCOUNTER — Institutional Professional Consult (permissible substitution): Payer: Medicaid Other

## 2014-01-13 LAB — POCT URINALYSIS DIPSTICK
BILIRUBIN UA: NEGATIVE
GLUCOSE UA: 100
Ketones, UA: NEGATIVE
NITRITE UA: NEGATIVE
PH UA: 7
Protein, UA: NEGATIVE
RBC UA: NEGATIVE
SPEC GRAV UA: 1.015
Urobilinogen, UA: NEGATIVE

## 2014-01-14 ENCOUNTER — Encounter: Payer: Self-pay | Admitting: Obstetrics & Gynecology

## 2014-01-14 ENCOUNTER — Ambulatory Visit (INDEPENDENT_AMBULATORY_CARE_PROVIDER_SITE_OTHER): Payer: Medicaid Other | Admitting: Obstetrics & Gynecology

## 2014-01-14 ENCOUNTER — Institutional Professional Consult (permissible substitution): Payer: Medicaid Other

## 2014-01-14 ENCOUNTER — Encounter: Payer: Medicaid Other | Admitting: Obstetrics & Gynecology

## 2014-01-14 VITALS — BP 124/78 | HR 81 | Temp 98.2°F | Wt 257.0 lb

## 2014-01-14 DIAGNOSIS — O99810 Abnormal glucose complicating pregnancy: Secondary | ICD-10-CM

## 2014-01-14 DIAGNOSIS — E669 Obesity, unspecified: Secondary | ICD-10-CM

## 2014-01-14 DIAGNOSIS — O99210 Obesity complicating pregnancy, unspecified trimester: Secondary | ICD-10-CM

## 2014-01-14 DIAGNOSIS — O24414 Gestational diabetes mellitus in pregnancy, insulin controlled: Secondary | ICD-10-CM

## 2014-01-14 DIAGNOSIS — O99213 Obesity complicating pregnancy, third trimester: Secondary | ICD-10-CM

## 2014-01-14 LAB — CULTURE, OB URINE: Colony Count: 10000

## 2014-01-14 NOTE — Patient Instructions (Signed)
Labor Induction  Labor induction is when steps are taken to cause a pregnant woman to begin the labor process. Most women go into labor on their own between 37 weeks and 42 weeks of the pregnancy. When this does not happen or when there is a medical need, methods may be used to induce labor. Labor induction causes a pregnant woman's uterus to contract. It also causes the cervix to soften (ripen), open (dilate), and thin out (efface). Usually, labor is not induced before 39 weeks of the pregnancy unless there is a problem with the baby or mother.  Before inducing labor, your health care provider will consider a number of factors, including the following:  The medical condition of you and the baby.   How many weeks along you are.   The status of the baby's lung maturity.   The condition of the cervix.   The position of the baby.  WHAT ARE THE REASONS FOR LABOR INDUCTION? Labor may be induced for the following reasons:  The health of the baby or mother is at risk.   The pregnancy is overdue by 1 week or more.   The water breaks but labor does not start on its own.   The mother has a health condition or serious illness, such as high blood pressure, infection, placental abruption, or diabetes.  The amniotic fluid amounts are low around the baby.   The baby is distressed.  Convenience or wanting the baby to be born on a certain date is not a reason for inducing labor. WHAT METHODS ARE USED FOR LABOR INDUCTION? Several methods of labor induction may be used, such as:   Prostaglandin medicine. This medicine causes the cervix to dilate and ripen. The medicine will also start contractions. It can be taken by mouth or by inserting a suppository into the vagina.   Inserting a thin tube (catheter) with a balloon on the end into the vagina to dilate the cervix. Once inserted, the balloon is expanded with water, which causes the cervix to open.   Stripping the membranes. Your health  care provider separates amniotic sac tissue from the cervix, causing the cervix to be stretched and causing the release of a hormone called progesterone. This may cause the uterus to contract. It is often done during an office visit. You will be sent home to wait for the contractions to begin. You will then come in for an induction.   Breaking the water. Your health care provider makes a hole in the amniotic sac using a small instrument. Once the amniotic sac breaks, contractions should begin. This may still take hours to see an effect.   Medicine to trigger or strengthen contractions. This medicine is given through an IV access tube inserted into a vein in your arm.  All of the methods of induction, besides stripping the membranes, will be done in the hospital. Induction is done in the hospital so that you and the baby can be carefully monitored.  HOW LONG DOES IT TAKE FOR LABOR TO BE INDUCED? Some inductions can take up to 2-3 days. Depending on the cervix, it usually takes less time. It takes longer when you are induced early in the pregnancy or if this is your first pregnancy. If a mother is still pregnant and the induction has been going on for 2-3 days, either the mother will be sent home or a cesarean delivery will be needed. WHAT ARE THE RISKS ASSOCIATED WITH LABOR INDUCTION? Some of the risks of induction   include:   Changes in fetal heart rate, such as too high, too low, or erratic.   Fetal distress.   Chance of infection for the mother and baby.   Increased chance of having a cesarean delivery.   Breaking off (abruption) of the placenta from the uterus (rare).   Uterine rupture (very rare).  When induction is needed for medical reasons, the benefits of induction may outweigh the risks. WHAT ARE SOME REASONS FOR NOT INDUCING LABOR? Labor induction should not be done if:   It is shown that your baby does not tolerate labor.   You have had previous surgeries on your  uterus, such as a myomectomy or the removal of fibroids.   Your placenta lies very low in the uterus and blocks the opening of the cervix (placenta previa).   Your baby is not in a head-down position.   The umbilical cord drops down into the birth canal in front of the baby. This could cut off the baby's blood and oxygen supply.   You have had a previous cesarean delivery.   There are unusual circumstances, such as the baby being extremely premature.  Document Released: 11/07/2006 Document Revised: 02/18/2013 Document Reviewed: 01/15/2013 [employer] Patient Information 2015 [employer]. This information is not intended to replace advice given to you by your health care provider. Make sure you discuss any questions you have with your health care provider.  

## 2014-01-14 NOTE — Progress Notes (Signed)
Subjective:    Donna ChristenJanai Burkhead is a 18 y.o. female being seen today for her obstetrical visit. She is at 2056w2d gestation. Patient reports no complaints. Fetal movement: normal. CBGs in range. Problem List Items Addressed This Visit   None     Patient Active Problem List   Diagnosis Date Noted  . Depression 11/25/2013    Priority: High  . Gestational diabetes 11/15/2013    Priority: High  . Obesity complicating pregnancy 10/12/2013    Priority: High  . Chlamydia infection during pregnancy, antepartum 01/03/2014  . GBS (group B Streptococcus carrier), +RV culture, currently pregnant 01/03/2014  . Unspecified high-risk pregnancy 07/08/2013    Objective:    BP 124/78  Pulse 81  Temp(Src) 98.2 F (36.8 C)  Wt 116.574 kg (257 lb) FHT: 130 BPM  Uterine Size: size equals dates  Presentations: cephalic     Assessment:    Pregnancy @ 3956w2d weeks   Plan:   Plans for delivery: Vaginal anticipated; labs reviewed; problem list updated  Follow up  twice weekly.

## 2014-01-18 ENCOUNTER — Encounter: Payer: Self-pay | Admitting: Obstetrics & Gynecology

## 2014-01-18 ENCOUNTER — Ambulatory Visit (INDEPENDENT_AMBULATORY_CARE_PROVIDER_SITE_OTHER): Payer: Medicaid Other | Admitting: Obstetrics & Gynecology

## 2014-01-18 VITALS — BP 116/76 | HR 88 | Temp 98.2°F | Wt 258.0 lb

## 2014-01-18 DIAGNOSIS — B379 Candidiasis, unspecified: Secondary | ICD-10-CM

## 2014-01-18 DIAGNOSIS — Z3403 Encounter for supervision of normal first pregnancy, third trimester: Secondary | ICD-10-CM

## 2014-01-18 DIAGNOSIS — O99810 Abnormal glucose complicating pregnancy: Secondary | ICD-10-CM

## 2014-01-18 DIAGNOSIS — Z3400 Encounter for supervision of normal first pregnancy, unspecified trimester: Secondary | ICD-10-CM

## 2014-01-18 LAB — POCT URINALYSIS DIPSTICK
Glucose, UA: NEGATIVE
Ketones, UA: NEGATIVE
NITRITE UA: NEGATIVE
Protein, UA: NEGATIVE
RBC UA: NEGATIVE
Spec Grav, UA: <=1.005
pH, UA: 7

## 2014-01-18 MED ORDER — TERCONAZOLE 0.4 % VA CREA
1.0000 | TOPICAL_CREAM | Freq: Every day | VAGINAL | Status: DC
Start: 2014-01-18 — End: 2014-01-25

## 2014-01-18 NOTE — Progress Notes (Signed)
Subjective:    Felicia ChristenJanai Holt is a 18 y.o. female being seen today for her obstetrical visit. She is at 6665w6d gestation. Patient reports no complaints. Fetal movement: normal. CBGs in range. Problem List Items Addressed This Visit   None    Visit Diagnoses   Encounter for supervision of normal first pregnancy in third trimester    -  Primary    Relevant Orders       POCT urinalysis dipstick (Completed)       US OB Follow Up    Yeast infection        Relevant Medications       Terconazole (TERAZOL 7) 0.4% Vaginal cream      Patient Active Problem List   Diagnosis Date Noted  . Depression 11/25/2013    Priority: High  . Gestational diabetes 11/15/2013    Priority: High  . Obesity complicating pregnancy 10/12/2013    Priority: High  . Chlamydia infection during pregnancy, antepartum 01/03/2014  . GBS (group B Streptococcus carrier), +RV culture, currently pregnant 01/03/2014  . Unspecified high-risk pregnancy 07/08/2013    Objective:    BP 116/76  Pulse 88  Temp(Src) 98.2 F (36.8 C)  Wt 117.028 kg (258 lb) FHT: 130 BPM  Uterine Size: size equals dates  Presentations: cephalic     Assessment:    Pregnancy @ 9765w6d weeks   Plan:   Plans for delivery: Vaginal anticipated; labs reviewed; problem list updated IOL scheduled Follow up  twice weekly.

## 2014-01-18 NOTE — Patient Instructions (Signed)
Labor Induction  Labor induction is when steps are taken to cause a pregnant woman to begin the labor process. Most women go into labor on their own between 37 weeks and 42 weeks of the pregnancy. When this does not happen or when there is a medical need, methods may be used to induce labor. Labor induction causes a pregnant woman's uterus to contract. It also causes the cervix to soften (ripen), open (dilate), and thin out (efface). Usually, labor is not induced before 39 weeks of the pregnancy unless there is a problem with the baby or mother.  Before inducing labor, your health care provider will consider a number of factors, including the following:  The medical condition of you and the baby.   How many weeks along you are.   The status of the baby's lung maturity.   The condition of the cervix.   The position of the baby.  WHAT ARE THE REASONS FOR LABOR INDUCTION? Labor may be induced for the following reasons:  The health of the baby or mother is at risk.   The pregnancy is overdue by 1 week or more.   The water breaks but labor does not start on its own.   The mother has a health condition or serious illness, such as high blood pressure, infection, placental abruption, or diabetes.  The amniotic fluid amounts are low around the baby.   The baby is distressed.  Convenience or wanting the baby to be born on a certain date is not a reason for inducing labor. WHAT METHODS ARE USED FOR LABOR INDUCTION? Several methods of labor induction may be used, such as:   Prostaglandin medicine. This medicine causes the cervix to dilate and ripen. The medicine will also start contractions. It can be taken by mouth or by inserting a suppository into the vagina.   Inserting a thin tube (catheter) with a balloon on the end into the vagina to dilate the cervix. Once inserted, the balloon is expanded with water, which causes the cervix to open.   Stripping the membranes. Your health  care provider separates amniotic sac tissue from the cervix, causing the cervix to be stretched and causing the release of a hormone called progesterone. This may cause the uterus to contract. It is often done during an office visit. You will be sent home to wait for the contractions to begin. You will then come in for an induction.   Breaking the water. Your health care provider makes a hole in the amniotic sac using a small instrument. Once the amniotic sac breaks, contractions should begin. This may still take hours to see an effect.   Medicine to trigger or strengthen contractions. This medicine is given through an IV access tube inserted into a vein in your arm.  All of the methods of induction, besides stripping the membranes, will be done in the hospital. Induction is done in the hospital so that you and the baby can be carefully monitored.  HOW LONG DOES IT TAKE FOR LABOR TO BE INDUCED? Some inductions can take up to 2-3 days. Depending on the cervix, it usually takes less time. It takes longer when you are induced early in the pregnancy or if this is your first pregnancy. If a mother is still pregnant and the induction has been going on for 2-3 days, either the mother will be sent home or a cesarean delivery will be needed. WHAT ARE THE RISKS ASSOCIATED WITH LABOR INDUCTION? Some of the risks of induction   include:   Changes in fetal heart rate, such as too high, too low, or erratic.   Fetal distress.   Chance of infection for the mother and baby.   Increased chance of having a cesarean delivery.   Breaking off (abruption) of the placenta from the uterus (rare).   Uterine rupture (very rare).  When induction is needed for medical reasons, the benefits of induction may outweigh the risks. WHAT ARE SOME REASONS FOR NOT INDUCING LABOR? Labor induction should not be done if:   It is shown that your baby does not tolerate labor.   You have had previous surgeries on your  uterus, such as a myomectomy or the removal of fibroids.   Your placenta lies very low in the uterus and blocks the opening of the cervix (placenta previa).   Your baby is not in a head-down position.   The umbilical cord drops down into the birth canal in front of the baby. This could cut off the baby's blood and oxygen supply.   You have had a previous cesarean delivery.   There are unusual circumstances, such as the baby being extremely premature.  Document Released: 11/07/2006 Document Revised: 02/18/2013 Document Reviewed: 01/15/2013 [employer] Patient Information 2015 [employer]. This information is not intended to replace advice given to you by your health care provider. Make sure you discuss any questions you have with your health care provider.  

## 2014-01-19 ENCOUNTER — Ambulatory Visit (HOSPITAL_COMMUNITY)
Admission: RE | Admit: 2014-01-19 | Discharge: 2014-01-19 | Disposition: A | Discharge status: Home / Self Care | Payer: Medicaid Other | Source: Ambulatory Visit | Attending: Obstetrics & Gynecology | Admitting: Obstetrics & Gynecology

## 2014-01-19 DIAGNOSIS — Z3689 Encounter for other specified antenatal screening: Secondary | ICD-10-CM | POA: Insufficient documentation

## 2014-01-19 DIAGNOSIS — Z3403 Encounter for supervision of normal first pregnancy, third trimester: Secondary | ICD-10-CM

## 2014-01-21 ENCOUNTER — Telehealth (HOSPITAL_COMMUNITY): Payer: Self-pay | Admitting: *Deleted

## 2014-01-21 ENCOUNTER — Encounter: Payer: Self-pay | Admitting: Obstetrics & Gynecology

## 2014-01-21 ENCOUNTER — Ambulatory Visit (INDEPENDENT_AMBULATORY_CARE_PROVIDER_SITE_OTHER): Payer: Medicaid Other | Admitting: Obstetrics & Gynecology

## 2014-01-21 VITALS — BP 133/84 | HR 84 | Temp 98.1°F | Wt 262.0 lb

## 2014-01-21 DIAGNOSIS — Z3400 Encounter for supervision of normal first pregnancy, unspecified trimester: Secondary | ICD-10-CM

## 2014-01-21 DIAGNOSIS — O99810 Abnormal glucose complicating pregnancy: Secondary | ICD-10-CM

## 2014-01-21 DIAGNOSIS — IMO0002 Reserved for concepts with insufficient information to code with codable children: Secondary | ICD-10-CM

## 2014-01-21 DIAGNOSIS — O24414 Gestational diabetes mellitus in pregnancy, insulin controlled: Secondary | ICD-10-CM

## 2014-01-21 NOTE — Progress Notes (Signed)
Patient states she has some increasing pressure

## 2014-01-21 NOTE — Telephone Encounter (Signed)
Preadmission screen  

## 2014-01-22 ENCOUNTER — Telehealth: Payer: Self-pay | Admitting: *Deleted

## 2014-01-22 DIAGNOSIS — IMO0002 Reserved for concepts with insufficient information to code with codable children: Secondary | ICD-10-CM | POA: Insufficient documentation

## 2014-01-22 NOTE — Telephone Encounter (Signed)
Left message on pt voicemail making her aware of change in induction time.  Pt induction has been changed to Tuesday July 28 at 0630.  Pt advised to call office if any problems with this time.

## 2014-01-22 NOTE — Progress Notes (Signed)
Subjective:    Felicia ChristenJanai Holt is a 18 y.o. female being seen today for her obstetrical visit. She is at 1549w2d gestation. Patient reports no complaints. Fetal movement: normal. CBGs in range. Problem List Items Addressed This Visit     High   Gestational diabetes - Primary   Relevant Orders      Fetal non-stress test      POCT urinalysis dipstick     Patient Active Problem List   Diagnosis Date Noted  . Depression 11/25/2013    Priority: High  . Gestational diabetes 11/15/2013    Priority: High  . Obesity complicating pregnancy 10/12/2013    Priority: High  . Chlamydia infection during pregnancy, antepartum 01/03/2014  . GBS (group B Streptococcus carrier), +RV culture, currently pregnant 01/03/2014  . Unspecified high-risk pregnancy 07/08/2013    Objective:    BP 133/84  Pulse 84  Temp(Src) 98.1 F (36.7 C)  Wt 118.842 kg (262 lb) FHT: 130 BPM  Uterine Size: size equals dates  Presentations: cephalic   Membranes stripped U/S on 7/21: EFW > 90%; AC @ 97% Assessment:    Pregnancy @ 6849w2d weeks  Recent U/S concerning for LGA Plan:   Plans for delivery: Vaginal anticipated; labs reviewed; problem list updated IOL next week

## 2014-01-22 NOTE — Patient Instructions (Addendum)
Labor Induction  Labor induction is when steps are taken to cause a pregnant woman to begin the labor process. Most women go into labor on their own between 37 weeks and 42 weeks of the pregnancy. When this does not happen or when there is a medical need, methods may be used to induce labor. Labor induction causes a pregnant woman's uterus to contract. It also causes the cervix to soften (ripen), open (dilate), and thin out (efface). Usually, labor is not induced before 39 weeks of the pregnancy unless there is a problem with the baby or mother.  Before inducing labor, your health care provider will consider a number of factors, including the following:  The medical condition of you and the baby.   How many weeks along you are.   The status of the baby's lung maturity.   The condition of the cervix.   The position of the baby.  WHAT ARE THE REASONS FOR LABOR INDUCTION? Labor may be induced for the following reasons:  The health of the baby or mother is at risk.   The pregnancy is overdue by 1 week or more.   The water breaks but labor does not start on its own.   The mother has a health condition or serious illness, such as high blood pressure, infection, placental abruption, or diabetes.  The amniotic fluid amounts are low around the baby.   The baby is distressed.  Convenience or wanting the baby to be born on a certain date is not a reason for inducing labor. WHAT METHODS ARE USED FOR LABOR INDUCTION? Several methods of labor induction may be used, such as:   Prostaglandin medicine. This medicine causes the cervix to dilate and ripen. The medicine will also start contractions. It can be taken by mouth or by inserting a suppository into the vagina.   Inserting a thin tube (catheter) with a balloon on the end into the vagina to dilate the cervix. Once inserted, the balloon is expanded with water, which causes the cervix to open.   Stripping the membranes. Your health  care provider separates amniotic sac tissue from the cervix, causing the cervix to be stretched and causing the release of a hormone called progesterone. This may cause the uterus to contract. It is often done during an office visit. You will be sent home to wait for the contractions to begin. You will then come in for an induction.   Breaking the water. Your health care provider makes a hole in the amniotic sac using a small instrument. Once the amniotic sac breaks, contractions should begin. This may still take hours to see an effect.   Medicine to trigger or strengthen contractions. This medicine is given through an IV access tube inserted into a vein in your arm.  All of the methods of induction, besides stripping the membranes, will be done in the hospital. Induction is done in the hospital so that you and the baby can be carefully monitored.  HOW LONG DOES IT TAKE FOR LABOR TO BE INDUCED? Some inductions can take up to 2-3 days. Depending on the cervix, it usually takes less time. It takes longer when you are induced early in the pregnancy or if this is your first pregnancy. If a mother is still pregnant and the induction has been going on for 2-3 days, either the mother will be sent home or a cesarean delivery will be needed. WHAT ARE THE RISKS ASSOCIATED WITH LABOR INDUCTION? Some of the risks of induction   include:   Changes in fetal heart rate, such as too high, too low, or erratic.   Fetal distress.   Chance of infection for the mother and baby.   Increased chance of having a cesarean delivery.   Breaking off (abruption) of the placenta from the uterus (rare).   Uterine rupture (very rare).  When induction is needed for medical reasons, the benefits of induction may outweigh the risks. WHAT ARE SOME REASONS FOR NOT INDUCING LABOR? Labor induction should not be done if:   It is shown that your baby does not tolerate labor.   You have had previous surgeries on your  uterus, such as a myomectomy or the removal of fibroids.   Your placenta lies very low in the uterus and blocks the opening of the cervix (placenta previa).   Your baby is not in a head-down position.   The umbilical cord drops down into the birth canal in front of the baby. This could cut off the baby's blood and oxygen supply.   You have had a previous cesarean delivery.   There are unusual circumstances, such as the baby being extremely premature.  Document Released: 11/07/2006 Document Revised: 02/18/2013 Document Reviewed: 01/15/2013 [employer] Patient Information 2015 [employer]. This information is not intended to replace advice given to you by your health care provider. Make sure you discuss any questions you have with your health care provider.  

## 2014-01-25 ENCOUNTER — Inpatient Hospital Stay (EMERGENCY_DEPARTMENT_HOSPITAL)
Admission: AD | Admit: 2014-01-25 | Discharge: 2014-01-25 | Disposition: A | Discharge status: Home / Self Care | Payer: Medicaid Other | Source: Ambulatory Visit | Attending: Obstetrics | Admitting: Obstetrics

## 2014-01-25 ENCOUNTER — Encounter (HOSPITAL_COMMUNITY): Payer: Self-pay | Admitting: *Deleted

## 2014-01-25 DIAGNOSIS — A749 Chlamydial infection, unspecified: Secondary | ICD-10-CM

## 2014-01-25 DIAGNOSIS — M545 Low back pain, unspecified: Secondary | ICD-10-CM | POA: Insufficient documentation

## 2014-01-25 DIAGNOSIS — O99213 Obesity complicating pregnancy, third trimester: Secondary | ICD-10-CM

## 2014-01-25 DIAGNOSIS — M549 Dorsalgia, unspecified: Secondary | ICD-10-CM

## 2014-01-25 DIAGNOSIS — O0990 Supervision of high risk pregnancy, unspecified, unspecified trimester: Secondary | ICD-10-CM

## 2014-01-25 DIAGNOSIS — O99810 Abnormal glucose complicating pregnancy: Secondary | ICD-10-CM | POA: Insufficient documentation

## 2014-01-25 DIAGNOSIS — O9989 Other specified diseases and conditions complicating pregnancy, childbirth and the puerperium: Secondary | ICD-10-CM

## 2014-01-25 DIAGNOSIS — O99891 Other specified diseases and conditions complicating pregnancy: Secondary | ICD-10-CM | POA: Insufficient documentation

## 2014-01-25 DIAGNOSIS — O99820 Streptococcus B carrier state complicating pregnancy: Secondary | ICD-10-CM

## 2014-01-25 DIAGNOSIS — O98519 Other viral diseases complicating pregnancy, unspecified trimester: Secondary | ICD-10-CM

## 2014-01-25 DIAGNOSIS — O98819 Other maternal infectious and parasitic diseases complicating pregnancy, unspecified trimester: Principal | ICD-10-CM

## 2014-01-25 DIAGNOSIS — O24414 Gestational diabetes mellitus in pregnancy, insulin controlled: Secondary | ICD-10-CM

## 2014-01-25 LAB — POCT URINALYSIS DIPSTICK
BILIRUBIN UA: NEGATIVE
GLUCOSE UA: 50
Ketones, UA: NEGATIVE
Nitrite, UA: NEGATIVE
Protein, UA: NEGATIVE
RBC UA: NEGATIVE
Spec Grav, UA: 1.01
UROBILINOGEN UA: NEGATIVE
pH, UA: 7

## 2014-01-25 LAB — GLUCOSE, CAPILLARY: GLUCOSE-CAPILLARY: 154 mg/dL — AB (ref 70–99)

## 2014-01-25 MED ORDER — CYCLOBENZAPRINE HCL 10 MG PO TABS: 10.0000 mg | ORAL_TABLET | Freq: Two times a day (BID) | ORAL | Status: DC | PRN

## 2014-01-25 NOTE — Discharge Instructions (Signed)

## 2014-01-25 NOTE — MAU Provider Note (Signed)
History     CSN: [id number]  Arrival date and time: 01/25/14 1400   First Provider Initiated Contact with Patient 01/25/14 1457      Chief Complaint  Patient presents with  . Back Pain   HPI Comments: Felicia Holt 18 y.o. G1P0  48w6dpresents to MAU for low back pain x 2-3 days. She has taken no medications. She is not having any contractions, LOF or vaginal bleeding.  She has GDM and is on insulin, but she has not taken it for 2 days because she left syringes at another house. She admits the other house is not that far away. Pt has induction scheduled for tomorrow  Back Pain      Past Medical History  Diagnosis Date  . Gestational diabetes     Past Surgical History  Procedure Laterality Date  . Tonsillectomy    . Wisdom tooth extraction    . Adenoidectomy      Family History  Problem Relation Age of Onset  . Hypertension Mother   . Depression Mother   . Hypertension Father   . Depression Father   . COPD Maternal Grandmother   . Hypertension Maternal Grandmother   . Cancer Maternal Grandmother   . Depression Maternal Grandmother   . Learning disabilities Brother   . Early death Paternal Grandmother     History  Substance Use Topics  . Smoking status: Never Smoker   . Smokeless tobacco: Never Used  . Alcohol Use: No    Allergies: No Known Allergies  Prescriptions prior to admission  Medication Sig Dispense Refill  . ACCU-CHEK SOFTCLIX LANCETS lancets Use as instructed  100 each  12  . Blood Glucose Monitoring Suppl (ACCU-CHEK AVIVA PLUS) W/DEVICE KIT 1 Device by Does not apply route once.  1 kit  0  . glucose blood test strip Use as instructed  100 each  12  . insulin aspart (NOVOLOG) 100 UNIT/ML injection Inject 12 Units into the skin 3 (three) times daily with meals.  10 mL  11  . insulin aspart (NOVOLOG) 100 UNIT/ML injection Inject 0-32 Units into the skin 3 (three) times daily. Patient is on a sliding scale.      . insulin NPH Human (HUMULIN  N,NOVOLIN N) 100 UNIT/ML injection Inject 0.2 mLs (20 Units total) into the skin daily before breakfast.  10 mL  11  . insulin NPH Human (HUMULIN N,NOVOLIN N) 100 UNIT/ML injection Inject 0.25 mLs (25 Units total) into the skin at bedtime.  10 mL  11  . Prenatal Vit-Fe Fumarate-FA (PRENATAL MULTIVITAMIN) TABS tablet Take 1 tablet by mouth daily at 12 noon.      . [DISCONTINUED] insulin aspart (NOVOLOG) 100 UNIT/ML injection Inject 0-32 Units into the skin 3 (three) times daily.  10 mL  11    Review of Systems  Constitutional: Negative.   HENT: Negative.   Eyes: Negative.   Respiratory: Negative.   Cardiovascular: Negative.   Gastrointestinal: Negative.   Genitourinary: Negative.   Musculoskeletal: Positive for back pain.  Skin: Negative.   Neurological: Negative.   Psychiatric/Behavioral: Negative.    Physical Exam   Blood pressure 127/68, pulse 99, temperature 98.5 F (36.9 C), temperature source Oral, resp. rate 20, height 5' 0.5" (1.537 m), weight 117.935 kg (260 lb).  Physical Exam  Constitutional: She is oriented to person, place, and time. She appears well-developed and well-nourished. No distress.  HENT:  Head: Normocephalic and atraumatic.  Cardiovascular: Normal rate, regular rhythm and normal heart sounds.  Respiratory: Effort normal and breath sounds normal.  GI: Soft. Bowel sounds are normal.  Genitourinary:  Cervix finger tip  Musculoskeletal: Normal range of motion. She exhibits tenderness.  Lumbar area muscles are tender  Neurological: She is alert and oriented to person, place, and time.  Skin: Skin is warm and dry.  Psychiatric: She has a normal mood and affect. Her behavior is normal. Judgment and thought content normal.   Results for orders placed during the hospital encounter of 01/25/14 (from the past 24 hour(s))  GLUCOSE, CAPILLARY     Status: Abnormal   Collection Time    01/25/14  3:08 PM      Result Value Ref Range   Glucose-Capillary 154 (*) 70 -  99 mg/dL   TOCO: FHR 150, reassuring, rare contraction  MAU Course  Procedures  MDM Blood glucose 154 UA Spoke with Dr Jodi Mourning who advised discharge to home with Flexeril prn Assessment and Plan   A: Low back pains  P: Flexeril 10 mg po q8 hours prn Drink plenty water/ rest Follow up with Dr Jodi Mourning as needed Return to MAU as needed for contractions q5 minutes and or LOF   Georgia Duff 01/25/2014, 3:15 PM

## 2014-01-25 NOTE — MAU Note (Signed)
Been having pain in butt and low back, been happening for a long time got real bad last night.  Hard to get up, hard to walk.  Decreased movement since last night..Marland Kitchen

## 2014-01-25 NOTE — MAU Note (Signed)
Assumed care of patient.

## 2014-01-26 ENCOUNTER — Inpatient Hospital Stay (HOSPITAL_COMMUNITY)
Admission: RE | Admit: 2014-01-26 | Discharge: 2014-01-28 | DRG: 781 | Disposition: A | Discharge status: Home / Self Care | Payer: Medicaid Other | Source: Ambulatory Visit | Attending: Obstetrics | Admitting: Obstetrics

## 2014-01-26 ENCOUNTER — Encounter (HOSPITAL_COMMUNITY): Payer: Self-pay

## 2014-01-26 VITALS — BP 130/84 | HR 87 | Temp 98.7°F | Resp 18 | Ht 60.5 in | Wt 260.0 lb

## 2014-01-26 DIAGNOSIS — Z8249 Family history of ischemic heart disease and other diseases of the circulatory system: Secondary | ICD-10-CM

## 2014-01-26 DIAGNOSIS — O99820 Streptococcus B carrier state complicating pregnancy: Secondary | ICD-10-CM

## 2014-01-26 DIAGNOSIS — O3660X0 Maternal care for excessive fetal growth, unspecified trimester, not applicable or unspecified: Secondary | ICD-10-CM | POA: Diagnosis present

## 2014-01-26 DIAGNOSIS — O0990 Supervision of high risk pregnancy, unspecified, unspecified trimester: Secondary | ICD-10-CM

## 2014-01-26 DIAGNOSIS — Z3490 Encounter for supervision of normal pregnancy, unspecified, unspecified trimester: Secondary | ICD-10-CM

## 2014-01-26 DIAGNOSIS — O99210 Obesity complicating pregnancy, unspecified trimester: Secondary | ICD-10-CM

## 2014-01-26 DIAGNOSIS — O9989 Other specified diseases and conditions complicating pregnancy, childbirth and the puerperium: Secondary | ICD-10-CM

## 2014-01-26 DIAGNOSIS — E669 Obesity, unspecified: Secondary | ICD-10-CM | POA: Diagnosis present

## 2014-01-26 DIAGNOSIS — O99810 Abnormal glucose complicating pregnancy: Secondary | ICD-10-CM | POA: Diagnosis present

## 2014-01-26 DIAGNOSIS — O99213 Obesity complicating pregnancy, third trimester: Secondary | ICD-10-CM

## 2014-01-26 DIAGNOSIS — O24414 Gestational diabetes mellitus in pregnancy, insulin controlled: Secondary | ICD-10-CM

## 2014-01-26 DIAGNOSIS — A749 Chlamydial infection, unspecified: Secondary | ICD-10-CM

## 2014-01-26 DIAGNOSIS — O99891 Other specified diseases and conditions complicating pregnancy: Secondary | ICD-10-CM | POA: Diagnosis present

## 2014-01-26 DIAGNOSIS — Z794 Long term (current) use of insulin: Secondary | ICD-10-CM | POA: Diagnosis not present

## 2014-01-26 DIAGNOSIS — O98819 Other maternal infectious and parasitic diseases complicating pregnancy, unspecified trimester: Secondary | ICD-10-CM

## 2014-01-26 DIAGNOSIS — Z22330 Carrier of Group B streptococcus: Secondary | ICD-10-CM

## 2014-01-26 LAB — CBC
HEMATOCRIT: 31.4 % — AB (ref 36.0–46.0)
Hemoglobin: 10.4 g/dL — ABNORMAL LOW (ref 12.0–15.0)
MCH: 26.3 pg (ref 26.0–34.0)
MCHC: 33.1 g/dL (ref 30.0–36.0)
MCV: 79.5 fL (ref 78.0–100.0)
PLATELETS: 222 10*3/uL (ref 150–400)
RBC: 3.95 MIL/uL (ref 3.87–5.11)
RDW: 14.3 % (ref 11.5–15.5)
WBC: 9.5 10*3/uL (ref 4.0–10.5)

## 2014-01-26 LAB — GLUCOSE, CAPILLARY
GLUCOSE-CAPILLARY: 128 mg/dL — AB (ref 70–99)
Glucose-Capillary: 119 mg/dL — ABNORMAL HIGH (ref 70–99)
Glucose-Capillary: 108 mg/dL — ABNORMAL HIGH (ref 70–99)

## 2014-01-26 LAB — RPR

## 2014-01-26 MED ORDER — ZOLPIDEM TARTRATE 5 MG PO TABS
5.0000 mg | ORAL_TABLET | Freq: Every evening | ORAL | Status: DC | PRN
  Administered 2014-01-27 – 2014-01-28 (×2): 5 mg via ORAL
  Filled 2014-01-26 (×2): qty 1

## 2014-01-26 MED ORDER — PENICILLIN G POTASSIUM 5000000 UNITS IJ SOLR
2.5000 10*6.[IU] | INTRAVENOUS | Status: DC | PRN
  Filled 2014-01-26: qty 2.5

## 2014-01-26 MED ORDER — MISOPROSTOL 50MCG HALF TABLET
50.0000 ug | ORAL_TABLET | Freq: Once | ORAL | Status: DC
  Filled 2014-01-26: qty 1

## 2014-01-26 MED ORDER — MISOPROSTOL 25 MCG QUARTER TABLET
50.0000 ug | ORAL_TABLET | ORAL | Status: DC
  Administered 2014-01-26: 50 ug via ORAL
  Filled 2014-01-26: qty 0.5

## 2014-01-26 MED ORDER — ONDANSETRON HCL 4 MG/2ML IJ SOLN: 4.0000 mg | Freq: Four times a day (QID) | INTRAMUSCULAR | Status: DC | PRN

## 2014-01-26 MED ORDER — INSULIN ASPART 100 UNIT/ML ~~LOC~~ SOLN: 0.0000 [IU] | Freq: Three times a day (TID) | SUBCUTANEOUS | Status: DC

## 2014-01-26 MED ORDER — OXYTOCIN BOLUS FROM INFUSION: 500.0000 mL | INTRAVENOUS | Status: DC

## 2014-01-26 MED ORDER — LACTATED RINGERS IV SOLN
500.0000 mL | INTRAVENOUS | Status: DC | PRN
  Administered 2014-01-27 (×2): 500 mL via INTRAVENOUS

## 2014-01-26 MED ORDER — OXYCODONE-ACETAMINOPHEN 5-325 MG PO TABS: 1.0000 | ORAL_TABLET | ORAL | Status: DC | PRN

## 2014-01-26 MED ORDER — INSULIN ASPART 100 UNIT/ML ~~LOC~~ SOLN
0.0000 [IU] | Freq: Three times a day (TID) | SUBCUTANEOUS | Status: DC
  Administered 2014-01-26 (×2): 2 [IU] via SUBCUTANEOUS
  Administered 2014-01-26: 3 [IU] via SUBCUTANEOUS

## 2014-01-26 MED ORDER — LACTATED RINGERS IV SOLN
INTRAVENOUS | Status: DC
  Administered 2014-01-27 – 2014-01-28 (×5): via INTRAVENOUS

## 2014-01-26 MED ORDER — MISOPROSTOL 25 MCG QUARTER TABLET
25.0000 ug | ORAL_TABLET | ORAL | Status: DC
  Administered 2014-01-26 – 2014-01-27 (×4): 25 ug via VAGINAL
  Filled 2014-01-26 (×4): qty 0.25

## 2014-01-26 MED ORDER — CITRIC ACID-SODIUM CITRATE 334-500 MG/5ML PO SOLN: 30.0000 mL | ORAL | Status: DC | PRN

## 2014-01-26 MED ORDER — PENICILLIN G POTASSIUM 5000000 UNITS IJ SOLR
5.0000 10*6.[IU] | Freq: Once | INTRAVENOUS | Status: DC
  Filled 2014-01-26: qty 5

## 2014-01-26 MED ORDER — IBUPROFEN 600 MG PO TABS: 600.0000 mg | ORAL_TABLET | Freq: Four times a day (QID) | ORAL | Status: DC | PRN

## 2014-01-26 MED ORDER — OXYTOCIN 40 UNITS IN LACTATED RINGERS INFUSION - SIMPLE MED
62.5000 mL/h | INTRAVENOUS | Status: DC
Start: 2014-01-26 — End: 2014-01-27

## 2014-01-26 MED ORDER — ACETAMINOPHEN 325 MG PO TABS: 650.0000 mg | ORAL_TABLET | ORAL | Status: DC | PRN

## 2014-01-26 MED ORDER — FLEET ENEMA 7-19 GM/118ML RE ENEM: 1.0000 | ENEMA | Freq: Every day | RECTAL | Status: DC | PRN

## 2014-01-26 MED ORDER — LIDOCAINE HCL (PF) 1 % IJ SOLN: 30.0000 mL | INTRAMUSCULAR | Status: DC | PRN

## 2014-01-26 MED ORDER — PENICILLIN G POTASSIUM 5000000 UNITS IJ SOLR
2.5000 10*6.[IU] | INTRAMUSCULAR | Status: DC
  Filled 2014-01-26 (×2): qty 2.5

## 2014-01-26 NOTE — Progress Notes (Signed)
Was in room when patient's mother and sister returned to room.  Another unidentified female was in the room with patient.  Pt's [facility] was angry and belligerent [facility].  She was angry that she had been explained that refreshments were not for visitors.  [ patient had previously expressed anger that with her daughters previous hospital stay that she had spend "$150" for food "only to find out from someone that you all have vouchers"  I explained to patient's mother that vouchers were not for free food but were an alternate payment method that visitors could use in lieu of a credit card].  She verbalized understanding.  Patients mother continued to express her anger of not being given 3 cans of sprite.  She stated that "I'll bet you that if you walked into any white bitches room you would find that the visitors having sprite.  Rn remained in room during the 20 minutes that the patients mother ranted about the experience.  After the mother calmed I offered her water, coffee, to find an administrative person to discuss her concerns.  She refused all of these offers and stated that she would follow up on her own.

## 2014-01-26 NOTE — Progress Notes (Signed)
Carbohydrate modified diet given.  Pt is frustrated with POC and concept that this may be a multiday induction, that she cannot have a regular diet instead of a carb modified,

## 2014-01-26 NOTE — Progress Notes (Signed)
Patient and mother angry regarding not being able to eat breakfast,  Explained that Dr Clearance CootsHarper would consider and needed to coordinate with insulin and cbgs.  Pt's mother expressed anger because she was hungry and did not like having to wait for daughter to order food.  Told mother that hospital did not require that all meal for same room be ordered together and she may order her own food at any time.

## 2014-01-26 NOTE — Progress Notes (Signed)
efm removed

## 2014-01-26 NOTE — Progress Notes (Signed)
Patient and family verbalized understanding POC and process for multi day induction.

## 2014-01-26 NOTE — H&P (Signed)
Donna ChristenJanai Gragert is a 18 y.o. female presenting for induction of labor for gestational diabetes. Maternal Medical History:  Fetal activity: Perceived fetal activity is normal.   Last perceived fetal movement was within the past hour.    Prenatal Complications - Diabetes: gestational. Diabetes is managed by insulin injections.      OB History   Grav Para Term Preterm Abortions TAB SAB Ect Mult Living   1         0     Past Medical History  Diagnosis Date  . Gestational diabetes    Past Surgical History  Procedure Laterality Date  . Tonsillectomy    . Wisdom tooth extraction    . Adenoidectomy     Family History: family history includes COPD in her maternal grandmother; Cancer in her maternal grandmother; Depression in her father, maternal grandmother, and mother; Early death in her paternal grandmother; Hypertension in her father, maternal grandmother, and mother; Learning disabilities in her brother. Social History:  reports that she has never smoked. She has never used smokeless tobacco. She reports that she does not drink alcohol or use illicit drugs.   Prenatal Transfer Tool  Maternal Diabetes: Yes:  Diabetes Type:  Insulin/Medication controlled Genetic Screening: Normal Maternal Ultrasounds/Referrals: Normal Fetal Ultrasounds or other Referrals:  Referred to Materal Fetal Medicine  Maternal Substance Abuse:  No Significant Maternal Medications:  Meds include: Other:  Significant Maternal Lab Results:  None Other Comments:  None  Review of Systems  All other systems reviewed and are negative.   Dilation: Fingertip Effacement (%): Thick Station: -3;Ballotable Exam by:: Paul Dykesina Feir RN Blood pressure 145/72, pulse 91, temperature 97.2 F (36.2 C), temperature source Oral, resp. rate 18, height 5' 0.5" (1.537 m), weight 260 lb (117.935 kg). Maternal Exam:  Abdomen: Patient reports no abdominal tenderness. Fetal presentation: vertex     Physical Exam  Nursing note and  vitals reviewed. Constitutional: She is oriented to person, place, and time. She appears well-developed and well-nourished.  HENT:  Head: Normocephalic and atraumatic.  Eyes: Conjunctivae are normal. Pupils are equal, round, and reactive to light.  Neck: Normal range of motion. Neck supple.  Cardiovascular: Normal rate and regular rhythm.   Respiratory: Effort normal.  GI: Soft.  Neurological: She is alert and oriented to person, place, and time.  Skin: Skin is warm and dry.  Psychiatric: She has a normal mood and affect. Her behavior is normal. Judgment and thought content normal.    Prenatal labs: ABO, Rh: --/--/AB POS (06/27 2200) Antibody: NEG (06/27 2200) Rubella: 0.56 (01/07 1134) RPR: NON REAC (05/11 1733)  HBsAg: NEGATIVE (01/07 1134)  HIV: NONREACTIVE (05/11 1733)  GBS: POSITIVE (07/02 1656)   Assessment/Plan: 39 weeks.  GDM.  LGA fetus.  Admit.  2 stage IOL.   HARPER,CHARLES A 01/26/2014, 11:13 AM

## 2014-01-27 LAB — GLUCOSE, CAPILLARY
GLUCOSE-CAPILLARY: 136 mg/dL — AB (ref 70–99)
GLUCOSE-CAPILLARY: 175 mg/dL — AB (ref 70–99)
GLUCOSE-CAPILLARY: 87 mg/dL (ref 70–99)
Glucose-Capillary: 114 mg/dL — ABNORMAL HIGH (ref 70–99)
Glucose-Capillary: 91 mg/dL (ref 70–99)
Glucose-Capillary: 97 mg/dL (ref 70–99)

## 2014-01-27 LAB — TYPE AND SCREEN
ABO/RH(D): AB POS
Antibody Screen: NEGATIVE

## 2014-01-27 MED ORDER — OXYTOCIN 40 UNITS IN LACTATED RINGERS INFUSION - SIMPLE MED
1.0000 m[IU]/min | INTRAVENOUS | Status: DC
  Administered 2014-01-28: 2 m[IU]/min via INTRAVENOUS

## 2014-01-27 MED ORDER — OXYTOCIN 40 UNITS IN LACTATED RINGERS INFUSION - SIMPLE MED
1.0000 m[IU]/min | INTRAVENOUS | Status: DC
  Administered 2014-01-27: 1 m[IU]/min via INTRAVENOUS
  Filled 2014-01-27: qty 1000

## 2014-01-27 MED ORDER — TERBUTALINE SULFATE 1 MG/ML IJ SOLN: 0.2500 mg | Freq: Once | INTRAMUSCULAR | Status: AC | PRN

## 2014-01-27 MED ORDER — LACTATED RINGERS IV SOLN
40.0000 [IU] | INTRAVENOUS | Status: DC
  Filled 2014-01-27: qty 4

## 2014-01-27 NOTE — Progress Notes (Signed)
Felicia ChristenJanai Holt is a 18 y.o. G1P0 at [redacted]w[redacted]d by LMP admitted for induction of labor due to Gestational diabetes.  Subjective:   Objective: BP 127/73  Pulse 90  Temp(Src) 98 F (36.7 C) (Oral)  Resp 20  Ht 5' 0.5" (1.537 m)  Wt 260 lb (117.935 kg)  BMI 49.92 kg/m2      FHT:  FHR: 140-150 bpm, variability: moderate,  accelerations:  Present,  decelerations:  Absent UC:   regular, every 2-3 minutes SVE:   Dilation: 1 Effacement (%): Thick Station: -2 Exam by:: Melvern BankerJinean Barnes, RN  Labs: Lab Results  Component Value Date   WBC 9.5 01/26/2014   HGB 10.4* 01/26/2014   HCT 31.4* 01/26/2014   MCV 79.5 01/26/2014   PLT 222 01/26/2014    Assessment / Plan: Induction of labor due to gestational diabetes and LGA fetus,  progressing well on pitocin  Labor: Latent phase, on low dose pitocin Preeclampsia:  n/a Fetal Wellbeing:  Category I Pain Control:  Stadol I/D:  n/a Anticipated MOD:  High risk for C/S due to GDM on insulin, LGA fetus and morbid obesity.  Felicia Holt A 01/27/2014, 8:29 AM

## 2014-01-27 NOTE — Progress Notes (Signed)
Patient not cooperative with positional changes or monitor adjustment.

## 2014-01-27 NOTE — Progress Notes (Signed)
Call made to Dr. Clearance CootsHarper. Notified of not starting pitocin due to FHR tracing; also notified of patient non-compliance with positional changes.

## 2014-01-27 NOTE — Progress Notes (Signed)
Discussed POC with patient and patient's mother to D/C pitocin and place cytotec after patient eats and showers per Dr Tamela OddiJackson-Moore. Patient verbalizes understanding and has no further questions at this time. Will continue to monitor.

## 2014-01-27 NOTE — Progress Notes (Signed)
Woody Sellerhelsea Chenoah Mcnally, RN, assumed care of patient.

## 2014-01-27 NOTE — Progress Notes (Signed)
Care reassumed by patient's assigned RN, Melvern BankerJinean Barnes.

## 2014-01-28 ENCOUNTER — Inpatient Hospital Stay (HOSPITAL_COMMUNITY): Admission: RE | Admit: 2014-01-28 | Payer: Medicaid Other | Source: Ambulatory Visit

## 2014-01-28 LAB — GLUCOSE, CAPILLARY: Glucose-Capillary: 119 mg/dL — ABNORMAL HIGH (ref 70–99)

## 2014-01-28 NOTE — Progress Notes (Signed)
Reviewed D/C instructions with pt. Answered questions regarding induction on Tuesday.  Instructed pt to call office with concerns on low fetal movement, blood sugars, bleeding or possible rupture of membranes.  Copy of instructions given to pt.

## 2014-01-28 NOTE — Progress Notes (Signed)
Dr Clearance CootsHarper at bedside.  Answered questions regarding DM and induction. POC is to D/C home with reactive NST.  Appt for induction made for 8/4 at 630am.  Instructed to call office with concerns regarding fetal movement and blood sugars.

## 2014-01-28 NOTE — Progress Notes (Signed)
Assumed care at this time.  Pt in tears when introductions were made this morning.  Is extremely frustrated at how her induction is proceeding.  Encouragement given and instruction given on how inductions can take place.  Mother of patient is also frustrated.  Encouragement given to mother.  Dr Clearance CootsHarper updated on pt complaints and status.  Will be in to see patient this morning.

## 2014-01-29 ENCOUNTER — Other Ambulatory Visit: Payer: Self-pay | Admitting: Obstetrics

## 2014-01-29 NOTE — Progress Notes (Signed)
Donna ChristenJanai Broshears is a 18 y.o. G1P0 at 1363w3d by LMP admitted for induction of labor due to Gestational diabetes.  Subjective:   Objective: BP 130/84  Pulse 87  Temp(Src) 98.7 F (37.1 C) (Oral)  Resp 18  Ht 5' 0.5" (1.537 m)  Wt 260 lb (117.935 kg)  BMI 49.92 kg/m2  SpO2 100%      FHT:  FHR: 130-140 bpm, variability: moderate,  accelerations:  Present,  decelerations:  Absent UC:   Irregular SVE:   Dilation: 2 Effacement (%): 60 Station: -3 Exam by:: JDaley  Labs: Lab Results  Component Value Date   WBC 9.5 01/26/2014   HGB 10.4* 01/26/2014   HCT 31.4* 01/26/2014   MCV 79.5 01/26/2014   PLT 222 01/26/2014    Assessment / Plan: IOL due to gestational diabetes.  No progress with cervical ripening.  Induction terminated and patient discharged home undelivered in good condition.  Will return for IOL on 02-02-14.  Labor: None Preeclampsia:  n/a Fetal Wellbeing:  Category I Pain Control:  none needed I/D:  n/a Anticipated MOD:  NSVD  HARPER,CHARLES A 01/29/2014, 12:35 AM

## 2014-01-29 NOTE — Discharge Summary (Signed)
Physician Discharge Summary  Patient ID: Felicia Holt MRN: [id number] DOB/AGE: 1995/10/29 18 y.o.  Admit date: 01/26/2014 Discharge date: 01/29/2014  Admission Diagnoses:  39 weeks.  Gestational Diabetes.  Discharge Diagnoses: Same Active Problems:   Pregnancy   Discharged Condition: good  Hospital Course: Admitted for IOL for gestational diabetes at [redacted] weeks gestation.  Cervical ripening attempted for 2 days without success.  Induction terminated and patient discharged home undelivered in good condition.  Consults: None  Significant Diagnostic Studies: none  Treatments: IV hydration and insulin  Discharge Exam: Blood pressure 130/84, pulse 87, temperature 98.7 F (37.1 C), temperature source Oral, resp. rate 18, height 5' 0.5" (1.537 m), weight 260 lb (117.935 kg), SpO2 100.00%. General appearance: alert and no distress GI: normal findings: soft, non-tender Pelvic: cervix thick / 1 cm / -3 / vtx Extremities: extremities normal, atraumatic, no cyanosis or edema  Disposition: 01-Home or Self Care  Discharge Instructions   Diet - low sodium heart healthy    Complete by:  As directed      Diet - low sodium heart healthy    Complete by:  As directed      Discharge patient    Complete by:  As directed      Increase activity slowly    Complete by:  As directed      Increase activity slowly    Complete by:  As directed             Medication List         ACCU-CHEK AVIVA PLUS W/DEVICE Kit  1 Device by Does not apply route once.     ACCU-CHEK SOFTCLIX LANCETS lancets  Use as instructed     acetaminophen 325 MG tablet  Commonly known as:  TYLENOL  Take 325 mg by mouth every 6 (six) hours as needed for mild pain or moderate pain.     cyclobenzaprine 10 MG tablet  Commonly known as:  FLEXERIL  Take 1 tablet (10 mg total) by mouth 2 (two) times daily as needed for muscle spasms.     glucose blood test strip  Use as instructed     insulin aspart 100 UNIT/ML  injection  Commonly known as:  novoLOG  Inject 12 Units into the skin 3 (three) times daily with meals.     insulin aspart 100 UNIT/ML injection  Commonly known as:  novoLOG  Inject 0-32 Units into the skin 3 (three) times daily. Patient is on a sliding scale.     insulin NPH Human 100 UNIT/ML injection  Commonly known as:  HUMULIN N,NOVOLIN N  Inject 0.2 mLs (20 Units total) into the skin daily before breakfast.     insulin NPH Human 100 UNIT/ML injection  Commonly known as:  HUMULIN N,NOVOLIN N  Inject 0.25 mLs (25 Units total) into the skin at bedtime.     prenatal multivitamin Tabs tablet  Take 1 tablet by mouth daily at 12 noon.           Follow-up Information   Follow up with HARPER,CHARLES A, MD. (Appt for induction on 02/02/14 at 630am on L+D)    Specialty:  Obstetrics and Gynecology   Contact information:   [address] [place] [address] [phone]       Signed: Shelly Bombard 01/29/2014, 12:43 AM

## 2014-01-31 ENCOUNTER — Inpatient Hospital Stay (HOSPITAL_COMMUNITY)
Admission: AD | Admit: 2014-01-31 | Discharge: 2014-02-01 | Disposition: A | Discharge status: Home / Self Care | Payer: Medicaid Other | Source: Ambulatory Visit | Attending: Obstetrics & Gynecology | Admitting: Obstetrics & Gynecology

## 2014-01-31 ENCOUNTER — Encounter (HOSPITAL_COMMUNITY): Payer: Self-pay | Admitting: *Deleted

## 2014-01-31 DIAGNOSIS — K219 Gastro-esophageal reflux disease without esophagitis: Secondary | ICD-10-CM | POA: Insufficient documentation

## 2014-01-31 DIAGNOSIS — O99213 Obesity complicating pregnancy, third trimester: Secondary | ICD-10-CM

## 2014-01-31 DIAGNOSIS — O99210 Obesity complicating pregnancy, unspecified trimester: Secondary | ICD-10-CM

## 2014-01-31 DIAGNOSIS — Z794 Long term (current) use of insulin: Secondary | ICD-10-CM | POA: Insufficient documentation

## 2014-01-31 DIAGNOSIS — O99810 Abnormal glucose complicating pregnancy: Secondary | ICD-10-CM | POA: Insufficient documentation

## 2014-01-31 DIAGNOSIS — E669 Obesity, unspecified: Secondary | ICD-10-CM | POA: Insufficient documentation

## 2014-01-31 DIAGNOSIS — O24414 Gestational diabetes mellitus in pregnancy, insulin controlled: Secondary | ICD-10-CM

## 2014-01-31 DIAGNOSIS — IMO0002 Reserved for concepts with insufficient information to code with codable children: Secondary | ICD-10-CM | POA: Insufficient documentation

## 2014-01-31 DIAGNOSIS — M79604 Pain in right leg: Secondary | ICD-10-CM

## 2014-01-31 DIAGNOSIS — M79609 Pain in unspecified limb: Secondary | ICD-10-CM | POA: Insufficient documentation

## 2014-01-31 HISTORY — DX: Gastro-esophageal reflux disease without esophagitis: K21.9

## 2014-01-31 NOTE — MAU Note (Signed)
Pt c/o swelling in her right leg but she has +2 swelling bilaterally up to her shins. Denies headache, or blurred vision.

## 2014-01-31 NOTE — MAU Note (Signed)
RLE swelling since today with pain, denies injury to the area. Mid left abdominal burning x 2 days when she drinks water.

## 2014-02-01 ENCOUNTER — Encounter: Payer: Medicaid Other | Admitting: Obstetrics & Gynecology

## 2014-02-01 ENCOUNTER — Telehealth (HOSPITAL_COMMUNITY): Payer: Self-pay | Admitting: *Deleted

## 2014-02-01 ENCOUNTER — Ambulatory Visit (HOSPITAL_COMMUNITY)
Admission: RE | Admit: 2014-02-01 | Discharge: 2014-02-01 | Disposition: A | Discharge status: Home / Self Care | Payer: Medicaid Other | Source: Ambulatory Visit | Attending: Obstetrics & Gynecology | Admitting: Obstetrics & Gynecology

## 2014-02-01 DIAGNOSIS — A749 Chlamydial infection, unspecified: Secondary | ICD-10-CM

## 2014-02-01 DIAGNOSIS — O99810 Abnormal glucose complicating pregnancy: Secondary | ICD-10-CM | POA: Insufficient documentation

## 2014-02-01 DIAGNOSIS — K219 Gastro-esophageal reflux disease without esophagitis: Secondary | ICD-10-CM | POA: Diagnosis not present

## 2014-02-01 DIAGNOSIS — O98519 Other viral diseases complicating pregnancy, unspecified trimester: Secondary | ICD-10-CM

## 2014-02-01 DIAGNOSIS — E669 Obesity, unspecified: Secondary | ICD-10-CM | POA: Diagnosis not present

## 2014-02-01 DIAGNOSIS — O24414 Gestational diabetes mellitus in pregnancy, insulin controlled: Secondary | ICD-10-CM

## 2014-02-01 DIAGNOSIS — Z794 Long term (current) use of insulin: Secondary | ICD-10-CM | POA: Diagnosis not present

## 2014-02-01 DIAGNOSIS — M79604 Pain in right leg: Secondary | ICD-10-CM

## 2014-02-01 DIAGNOSIS — Z3490 Encounter for supervision of normal pregnancy, unspecified, unspecified trimester: Secondary | ICD-10-CM

## 2014-02-01 DIAGNOSIS — IMO0002 Reserved for concepts with insufficient information to code with codable children: Secondary | ICD-10-CM | POA: Diagnosis present

## 2014-02-01 DIAGNOSIS — M79609 Pain in unspecified limb: Secondary | ICD-10-CM

## 2014-02-01 DIAGNOSIS — M7989 Other specified soft tissue disorders: Secondary | ICD-10-CM

## 2014-02-01 MED ORDER — ENOXAPARIN SODIUM 150 MG/ML ~~LOC~~ SOLN
1.0000 mg/kg | SUBCUTANEOUS | Status: AC
Start: 2014-02-01 — End: 2014-02-01
  Administered 2014-02-01: 130 mg via SUBCUTANEOUS
  Filled 2014-02-01: qty 1

## 2014-02-01 NOTE — Progress Notes (Signed)
VASCULAR LAB PRELIMINARY  PRELIMINARY  PRELIMINARY  PRELIMINARY  Right lower extremity venous duplex completed.    Preliminary report:  Right:  No evidence of DVT, superficial thrombosis, or Baker's cyst.  Talon Regala, RVT 02/01/2014, 9:34 AM

## 2014-02-01 NOTE — Discharge Instructions (Signed)
Deep Vein Thrombosis °A deep vein thrombosis (DVT) is a blood clot that develops in the deep, larger veins of the leg, arm, or pelvis. These are more dangerous than clots that might form in veins near the surface of the body. A DVT can lead to serious and even life-threatening complications if the clot breaks off and travels in the bloodstream to the lungs.  °A DVT can damage the valves in your leg veins so that instead of flowing upward, the blood pools in the lower leg. This is called post-thrombotic syndrome, and it can result in pain, swelling, discoloration, and sores on the leg. °CAUSES °Usually, several things contribute to the formation of blood clots. Contributing factors include: °· The flow of blood slows down. °· The inside of the vein is damaged in some way. °· You have a condition that makes blood clot more easily. °RISK FACTORS °Some people are more likely than others to develop blood clots. Risk factors include:  °· Smoking. °· Being overweight (obese). °· Sitting or lying still for a long time. This includes long-distance travel, paralysis, or recovery from an illness or surgery. °Other factors that increase risk are:  °· Older age, especially over 75 years of age. °· Having a family history of blood clots or if you have already had a blot clot. °· Having major or lengthy surgery. This is especially true for surgery on the hip, knee, or belly (abdomen). Hip surgery is particularly high risk. °· Having a long, thin tube (catheter) placed inside a vein during a medical procedure. °· Breaking a hip or leg. °· Having cancer or cancer treatment. °· Pregnancy and childbirth. °¨ Hormone changes make the blood clot more easily during pregnancy. °¨ The fetus puts pressure on the veins of the pelvis. °¨ There is a risk of injury to veins during delivery or a caesarean delivery. The risk is highest just after childbirth. °· Medicines containing the female hormone estrogen. This includes birth control pills and  hormone replacement therapy. °· Other circulation or heart problems. ° °SIGNS AND SYMPTOMS °When a clot forms, it can either partially or totally block the blood flow in that vein. Symptoms of a DVT can include: °· Swelling of the leg or arm, especially if one side is much worse. °· Warmth and redness of the leg or arm, especially if one side is much worse. °· Pain in an arm or leg. If the clot is in the leg, symptoms may be more noticeable or worse when standing or walking. °The symptoms of a DVT that has traveled to the lungs (pulmonary embolism, PE) usually start suddenly and include: °· Shortness of breath. °· Coughing. °· Coughing up blood or blood-tinged mucus. °· Chest pain. The chest pain is often worse with deep breaths. °· Rapid heartbeat. °Anyone with these symptoms should get emergency medical treatment right away. Do not wait to see if the symptoms will go away. Call your local emergency services (911 in the [place] if you have these symptoms. Do not drive yourself to the hospital. °DIAGNOSIS °If a DVT is suspected, your health care provider will take a full medical history and perform a physical exam. Tests that also may be required include: °· Blood tests, including studies of the clotting properties of the blood. °· Ultrasound to see if you have clots in your legs or lungs. °· X-rays to show the flow of blood when dye is injected into the veins (venogram). °· Studies of your lungs if you have any   chest symptoms. °PREVENTION °· Exercise the legs regularly. Take a brisk 30-minute walk every day. °· Maintain a weight that is appropriate for your height. °· Avoid sitting or lying in bed for long periods of time without moving your legs. °· Women, particularly those over the age of 35 years, should consider the risks and benefits of taking estrogen medicines, including birth control pills. °· Do not smoke, especially if you take estrogen medicines. °· Long-distance travel can increase your risk of DVT. You  should exercise your legs by walking or pumping the muscles every hour. °· Many of the risk factors above relate to situations that exist with hospitalization, either for illness, injury, or elective surgery. Prevention may include medical and nonmedical measures. °¨ Your health care provider will assess you for the need for venous thromboembolism prevention when you are admitted to the hospital. If you are having surgery, your surgeon will assess you the day of or day after surgery. °TREATMENT °Once identified, a DVT can be treated. It can also be prevented in some circumstances. Once you have had a DVT, you may be at increased risk for a DVT in the future. The most common treatment for DVT is blood-thinning (anticoagulant) medicine, which reduces the blood's tendency to clot. Anticoagulants can stop new blood clots from forming and stop old clots from growing. They cannot dissolve existing clots. Your body does this by itself over time. Anticoagulants can be given by mouth, through an IV tube, or by injection. Your health care provider will determine the best program for you. Other medicines or treatments that may be used are: °· Heparin or related medicines (low molecular weight heparin) are often the first treatment for a blood clot. They act quickly. However, they cannot be taken orally and must be given either in shot form or by IV tube. °¨ Heparin can cause a fall in a component of blood that stops bleeding and forms blood clots (platelets). You will be monitored with blood tests to be sure this does not occur. °· Warfarin is an anticoagulant that can be swallowed. It takes a few days to start working, so usually heparin or related medicines are used in combination. Once warfarin is working, heparin is usually stopped. °· Factor Xa inhibitor medicines, such as rivaroxaban and apixaban, also reduce blood clotting. These medicines are taken orally and can often be used without heparin or related  medicines. °· Less commonly, clot dissolving drugs (thrombolytics) are used to dissolve a DVT. They carry a high risk of bleeding, so they are used mainly in severe cases where your life or a part of your body is threatened. °· Very rarely, a blood clot in the leg needs to be removed surgically. °· If you are unable to take anticoagulants, your health care provider may arrange for you to have a filter placed in a main vein in your abdomen. This filter prevents clots from traveling to your lungs. °HOME CARE INSTRUCTIONS °· Take all medicines as directed by your health care provider. °· Learn as much as you can about DVT. °· Wear a medical alert bracelet or carry a medical alert card. °· Ask your health care provider how soon you can go back to normal activities. It is important to stay active to prevent blood clots. If you are on anticoagulant medicine, avoid contact sports. °· It is very important to exercise. This is especially important while traveling, sitting, or standing for long periods of time. Exercise your legs by walking or by   tightening and relaxing your leg muscles regularly. Take frequent walks. °· You may need to wear compression stockings. These are tight elastic stockings that apply pressure to the lower legs. This pressure can help keep the blood in the legs from clotting. °Taking Warfarin °Warfarin is a daily medicine that is taken by mouth. Your health care provider will advise you on the length of treatment (usually 3-6 months, sometimes lifelong). If you take warfarin: °· Understand how to take warfarin and foods that can affect how warfarin works in your body. °· Too much and too little warfarin are both dangerous. Too much warfarin increases the risk of bleeding. Too little warfarin continues to allow the risk for blood clots. °Warfarin and Regular Blood Testing °While taking warfarin, you will need to have regular blood tests to measure your blood clotting time. These blood tests usually  include both the prothrombin time (PT) and international normalized ratio (INR) tests. The PT and INR results allow your health care provider to adjust your dose of warfarin. It is very important that you have your PT and INR tested as often as directed by your health care provider.    °Warfarin and Your Diet °Avoid major changes in your diet, or notify your health care provider before changing your diet. Arrange a visit with a registered dietitian to answer your questions. Many foods, especially foods high in vitamin K, can interfere with warfarin and affect the PT and INR results. You should eat a consistent amount of foods high in vitamin K. Foods high in vitamin K include:  °· Spinach, kale, broccoli, cabbage, collard and turnip greens, Brussels sprouts, peas, cauliflower, seaweed, and parsley. °· Beef and pork liver. °· Green tea. °· Soybean oil. °Warfarin with Other Medicines °Many medicines can interfere with warfarin and affect the PT and INR results. You must: °· Tell your health care provider about any and all medicines, vitamins, and supplements you take, including aspirin and other over-the-counter anti-inflammatory medicines. Be especially cautious with aspirin and anti-inflammatory medicines. Ask your health care provider before taking these. °· Do not take or discontinue any prescribed or over-the-counter medicine except on the advice of your health care provider or pharmacist. °Warfarin Side Effects °Warfarin can have side effects, such as easy bruising and difficulty stopping bleeding. Ask your health care provider or pharmacist about other side effects of warfarin. You will need to: °· Hold pressure over cuts for longer than usual. °· Notify your dentist and other health care providers that you are taking warfarin before you undergo any procedures where bleeding may occur. °Warfarin with Alcohol and Tobacco  °· Drinking alcohol frequently can increase the effect of warfarin, leading to excess  bleeding. It is best to avoid alcoholic drinks or to consume only very small amounts while taking warfarin. Notify your health care provider if you change your alcohol intake.   °· Do not use any tobacco products including cigarettes, chewing tobacco, or electronic cigarettes. If you smoke, quit. Ask your health care provider for help with quitting smoking. °Alternative Medicines to Warfarin: Factor Xa Inhibitor Medicines °· These blood-thinning medicines are taken by mouth, usually for several weeks or longer. It is important to take the medicine every single day at the same time each day. °· There are no regular blood tests required when using these medicines. °· There are fewer food and drug interactions than with warfarin. °· The side effects of this class of medicine are similar to those of warfarin, including excessive bruising or bleeding. Ask your   health care provider or pharmacist about other potential side effects. SEEK MEDICAL CARE IF:  You notice a rapid heartbeat.  You feel weaker or more tired than usual.  You feel faint.  You notice increased bruising.  You feel your symptoms are not getting better in the time expected.  You believe you are having side effects of medicine. SEEK IMMEDIATE MEDICAL CARE IF:  You have chest pain.  You have trouble breathing.  You have new or increased swelling or pain in one leg.  You cough up blood.  You notice blood in vomit, in a bowel movement, or in urine. MAKE SURE YOU:  Understand these instructions.  Will watch your condition.  Will get help right away if you are not doing well or get worse. Document Released: 06/18/2005 Document Revised: 11/02/2013 Document Reviewed: 02/23/2013 [facility] Patient Information 2015 [employer], [place]. This information is not intended to replace advice given to you by your health care provider. Make sure you discuss any questions you have with your health care provider.  Gestational Diabetes  [place] Gestational diabetes mellitus, often simply referred to as gestational diabetes, is a type of diabetes that some women develop during pregnancy. In gestational diabetes, the pancreas does not make enough insulin (a hormone), the cells are less responsive to the insulin that is made (insulin resistance), or both.Normally, insulin moves sugars from food into the tissue cells. The tissue cells use the sugars for energy. The lack of insulin or the lack of normal response to insulin causes excess sugars to build up in the blood instead of going into the tissue cells. As a result, high blood sugar (hyperglycemia) develops. The effect of high sugar (glucose) levels can cause many problems.  RISK FACTORS You have an increased chance of developing gestational diabetes if you have a family history of diabetes and also have one or more of the following risk factors:  A body mass index over 30 (obesity).  A previous pregnancy with gestational diabetes.  An older age at the time of pregnancy. If blood glucose levels are kept in the normal range during pregnancy, women can have a healthy pregnancy. If your blood glucose levels are not well controlled, there may be risks to you, your unborn baby (fetus), your labor and delivery, or your newborn baby.  SYMPTOMS  If symptoms are experienced, they are much like symptoms you would normally expect during pregnancy. The symptoms of gestational diabetes include:   Increased thirst (polydipsia).  Increased urination (polyuria).  Increased urination during the night (nocturia).  Weight loss. This weight loss may be rapid.  Frequent, recurring infections.  Tiredness (fatigue).  Weakness.  Vision changes, such as blurred vision.  Fruity smell to your breath.  Abdominal pain. DIAGNOSIS Diabetes is diagnosed when blood glucose levels are increased. Your blood glucose level may be checked by one or more of the following blood tests:  A fasting blood  glucose test. You will not be allowed to eat for at least 8 hours before a blood sample is taken.  A random blood glucose test. Your blood glucose is checked at any time of the day regardless of when you ate.  A hemoglobin A1c blood glucose test. A hemoglobin A1c test provides information about blood glucose control over the previous 3 months.  An oral glucose tolerance test (OGTT). Your blood glucose is measured after you have not eaten (fasted) for 1-3 hours and then after you drink a glucose-containing beverage. Since the hormones that cause insulin resistance are  highest at about 24-28 weeks of a pregnancy, an OGTT is usually performed during that time. If you have risk factors for gestational diabetes, your health care provider may test you for gestational diabetes earlier than 24 weeks of pregnancy. TREATMENT   You will need to take diabetes medicine or insulin daily to keep blood glucose levels in the desired range.  You will need to match insulin dosing with exercise and healthy food choices. The treatment goal is to maintain the before-meal (preprandial), bedtime, and overnight blood glucose level at 60-99 mg/dL during pregnancy. The treatment goal is to further maintain peak after-meal blood sugar (postprandial glucose) level at 100-140 mg/dL. HOME CARE INSTRUCTIONS   Have your hemoglobin A1c level checked twice a year.  Perform daily blood glucose monitoring as directed by your health care provider. It is common to perform frequent blood glucose monitoring.  Monitor urine ketones when you are ill and as directed by your health care provider.  Take your diabetes medicine and insulin as directed by your health care provider to maintain your blood glucose level in the desired range.  Never run out of diabetes medicine or insulin. It is needed every day.  Adjust insulin based on your intake of carbohydrates. Carbohydrates can raise blood glucose levels but need to be included in your  diet. Carbohydrates provide vitamins, minerals, and fiber which are an essential part of a healthy diet. Carbohydrates are found in fruits, vegetables, whole grains, dairy products, legumes, and foods containing added sugars.  Eat healthy foods. Alternate 3 meals with 3 snacks.  Maintain a healthy weight gain. The usual total expected weight gain varies according to your prepregnancy body mass index (BMI).  Carry a medical alert card or wear your medical alert jewelry.  Carry a 15-gram carbohydrate snack with you at all times to treat low blood glucose (hypoglycemia). Some examples of 15-gram carbohydrate snacks include:  Glucose tablets, 3 or 4.  Glucose gel, 15-gram tube.  Raisins, 2 tablespoons (24 g).  Jelly beans, 6.  Animal crackers, 8.  Fruit juice, regular soda, or low-fat milk, 4 ounces (120 mL).  Gummy treats, 9.  Recognize hypoglycemia. Hypoglycemia during pregnancy occurs with blood glucose levels of 60 mg/dL and below. The risk for hypoglycemia increases when fasting or skipping meals, during or after intense exercise, and during sleep. Hypoglycemia symptoms can include:  Tremors or shakes.  Decreased ability to concentrate.  Sweating.  Increased heart rate.  Headache.  Dry mouth.  Hunger.  Irritability.  Anxiety.  Restless sleep.  Altered speech or coordination.  Confusion.  Treat hypoglycemia promptly. If you are alert and able to safely swallow, follow the 15:15 rule:  Take 15-20 grams of rapid-acting glucose or carbohydrate. Rapid-acting options include glucose gel, glucose tablets, or 4 ounces (120 mL) of fruit juice, regular soda, or low-fat milk.  Check your blood glucose level 15 minutes after taking the glucose.  Take 15-20 grams more of glucose if the repeat blood glucose level is still 70 mg/dL or below.  Eat a meal or snack within 1 hour once blood glucose levels return to normal.  Be alert to polyuria (excess urination) and  polydipsia (excess thirst) which are early signs of hyperglycemia. An early awareness of hyperglycemia allows for prompt treatment. Treat hyperglycemia as directed by your health care provider.  Engage in at least 30 minutes of physical activity a day or as directed by your health care provider. Ten minutes of physical activity timed 30 minutes after each meal  is encouraged to control postprandial blood glucose levels.  Adjust your insulin dosing and food intake as needed if you start a new exercise or sport.  Follow your sick-day plan at any time you are unable to eat or drink as usual.  Avoid tobacco and alcohol use.  Keep all follow-up visits as directed by your health care provider.  Follow the advice of your health care provider regarding your prenatal and post-delivery (postpartum) appointments, meal planning, exercise, medicines, vitamins, blood tests, other medical tests, and physical activities.  Perform daily skin and foot care. Examine your skin and feet daily for cuts, bruises, redness, nail problems, bleeding, blisters, or sores.  Brush your teeth and gums at least twice a day and floss at least once a day. Follow up with your dentist regularly.  Schedule an eye exam during the first trimester of your pregnancy or as directed by your health care provider.  Share your diabetes management plan with your workplace or school.  Stay up-to-date with immunizations.  Learn to manage stress.  Obtain ongoing diabetes education and support as needed.  Learn about and consider breastfeeding your baby.  You should have your blood sugar level checked 6-12 weeks after delivery. This is done with an oral glucose tolerance test (OGTT). SEEK MEDICAL CARE IF:   You are unable to eat food or drink fluids for more than 6 hours.  You have nausea and vomiting for more than 6 hours.  You have a blood glucose level of 200 mg/dL and you have ketones in your urine.  There is a change in  mental status.  You develop vision problems.  You have a persistent headache.  You have upper abdominal pain or discomfort.  You develop an additional serious illness.  You have diarrhea for more than 6 hours.  You have been sick or have had a fever for a couple of days and are not getting better. SEEK IMMEDIATE MEDICAL CARE IF:   You have difficulty breathing.  You no longer feel the baby moving.  You are bleeding or have discharge from your vagina.  You start having premature contractions or labor. MAKE SURE YOU:  Understand these instructions.  Will watch your condition.  Will get help right away if you are not doing well or get worse. Document Released: 09/24/2000 Document Revised: 11/02/2013 Document Reviewed: 01/15/2012 [facility] Patient Information 2015 [place], [place]. This information is not intended to replace advice given to you by your health care provider. Make sure you discuss any questions you have with your health care provider.

## 2014-02-01 NOTE — MAU Provider Note (Signed)
Chief Complaint:  Leg Swelling   First Provider Initiated Contact with Patient 02/01/14 0011      HPI: Felicia Holt is a 18 y.o. G1P0 at 71w6dwho presents to maternity admissions reporting swelling of both legs with significant pain, worse swelling and pain on right side.  She reports leg swelling xseveral weeks with worsening in last few days.  She was admitted to hospital on 7/28 for IOL and was in hospital x3 days with no labor progress. She was D/C'd from hospital and rescheduled for IOL on 02/02/14.  She is GDM on insulin.  She reports good fetal movement, denies regular contractions, LOF, vaginal bleeding, vaginal itching/burning, urinary symptoms, h/a, dizziness, n/v, or fever/chills.     Past Medical History: Past Medical History  Diagnosis Date  . Gestational diabetes   . GERD (gastroesophageal reflux disease)     Past obstetric history: OB History  Gravida Para Term Preterm AB SAB TAB Ectopic Multiple Living  1         0    # Outcome Date GA Lbr Len/2nd Weight Sex Delivery Anes PTL Lv  1 CUR               Past Surgical History: Past Surgical History  Procedure Laterality Date  . Tonsillectomy    . Wisdom tooth extraction    . Adenoidectomy      Family History: Family History  Problem Relation Age of Onset  . Hypertension Mother   . Depression Mother   . Hypertension Father   . Depression Father   . COPD Maternal Grandmother   . Hypertension Maternal Grandmother   . Cancer Maternal Grandmother   . Depression Maternal Grandmother   . Learning disabilities Brother   . Early death Paternal Grandmother     Social History: History  Substance Use Topics  . Smoking status: Never Smoker   . Smokeless tobacco: Never Used  . Alcohol Use: No    Allergies: No Known Allergies  Meds:  Prescriptions prior to admission  Medication Sig Dispense Refill  . ACCU-CHEK SOFTCLIX LANCETS lancets Use as instructed  100 each  12  . acetaminophen (TYLENOL) 325 MG tablet  Take 325 mg by mouth every 6 (six) hours as needed for mild pain or moderate pain.      . Blood Glucose Monitoring Suppl (ACCU-CHEK AVIVA PLUS) W/DEVICE KIT 1 Device by Does not apply route once.  1 kit  0  . cyclobenzaprine (FLEXERIL) 10 MG tablet Take 1 tablet (10 mg total) by mouth 2 (two) times daily as needed for muscle spasms.  20 tablet  0  . glucose blood test strip Use as instructed  100 each  12  . insulin aspart (NOVOLOG) 100 UNIT/ML injection Inject 12 Units into the skin 3 (three) times daily with meals.  10 mL  11  . insulin aspart (NOVOLOG) 100 UNIT/ML injection Inject 0-32 Units into the skin 3 (three) times daily. Patient is on a sliding scale.      . insulin NPH Human (HUMULIN N,NOVOLIN N) 100 UNIT/ML injection Inject 0.2 mLs (20 Units total) into the skin daily before breakfast.  10 mL  11  . insulin NPH Human (HUMULIN N,NOVOLIN N) 100 UNIT/ML injection Inject 0.25 mLs (25 Units total) into the skin at bedtime.  10 mL  11  . omeprazole (PRILOSEC) 40 MG capsule Take 40 mg by mouth daily.      . Prenatal Vit-Fe Fumarate-FA (PRENATAL MULTIVITAMIN) TABS tablet Take 1 tablet by  mouth daily at 12 noon.        ROS: Pertinent findings in history of present illness.  Physical Exam  Blood pressure 123/94, pulse 74, temperature 98.3 F (36.8 C), temperature source Oral, resp. rate 16, height 5' 2"  (1.575 m), weight 128.187 kg (282 lb 9.6 oz), SpO2 100.00%. GENERAL: Well-developed, well-nourished female in no acute distress.  HEENT: normocephalic HEART: normal rate RESP: normal effort ABDOMEN: Soft, non-tender, gravid appropriate for gestational age EXTREMITIES: Significant tenderness of BLE, 3+pitting edema bilaterally, left calf 46 cm in diameter, right calf 47 cm.  Negative Homans but pain of ankle/front of leg with dorsiflexion of foot.  Both legs cool to touch upon exam.  NEURO: alert and oriented  Dilation: 1.5 Effacement (%): 50 Cervical Position: Middle Station:  -3 Presentation: Vertex Exam by:: Leftwich kirby CNM     FHT:  Baseline 135 , moderate variability, accelerations present, no decelerations Contractions: None on toco or to palpation    Assessment: 1. Insulin controlled gestational diabetes mellitus in third trimester   2. Obesity complicating pregnancy, third trimester   3. Right leg pain   Low suspicion for DVT based on clinical exam.  Likely discomfort from significant pitting edema of BLE.  Plan: Consult Dr Ruthann Cancer Discharge home with DVT precautions Plan for venous doppler tomorrow morning at [place] 53m/kg given per protocol Labor precautions and fetal kick counts Keep scheduled IOL 02/02/14 Return to MAU as needed for emergencies    Medication List    ASK your doctor about these medications       ACCU-CHEK AVIVA PLUS W/DEVICE Kit  1 Device by Does not apply route once.     ACCU-CHEK SOFTCLIX LANCETS lancets  Use as instructed     acetaminophen 325 MG tablet  Commonly known as:  TYLENOL  Take 325 mg by mouth every 6 (six) hours as needed for mild pain or moderate pain.     cyclobenzaprine 10 MG tablet  Commonly known as:  FLEXERIL  Take 1 tablet (10 mg total) by mouth 2 (two) times daily as needed for muscle spasms.     glucose blood test strip  Use as instructed     insulin aspart 100 UNIT/ML injection  Commonly known as:  novoLOG  Inject 12 Units into the skin 3 (three) times daily with meals.     insulin aspart 100 UNIT/ML injection  Commonly known as:  novoLOG  Inject 0-32 Units into the skin 3 (three) times daily. Patient is on a sliding scale.     insulin NPH Human 100 UNIT/ML injection  Commonly known as:  HUMULIN N,NOVOLIN N  Inject 0.2 mLs (20 Units total) into the skin daily before breakfast.     insulin NPH Human 100 UNIT/ML injection  Commonly known as:  HUMULIN N,NOVOLIN N  Inject 0.25 mLs (25 Units total) into the skin at bedtime.     omeprazole 40 MG capsule  Commonly  known as:  PRILOSEC  Take 40 mg by mouth daily.     prenatal multivitamin Tabs tablet  Take 1 tablet by mouth daily at 12 noon.        LFatima BlankCertified Nurse-Midwife 02/01/2014 12:23 AM

## 2014-02-01 NOTE — Telephone Encounter (Signed)
Preadmission screen  

## 2014-02-02 ENCOUNTER — Inpatient Hospital Stay (HOSPITAL_COMMUNITY)
Admission: RE | Admit: 2014-02-02 | Discharge: 2014-02-07 | DRG: 765 | Disposition: A | Discharge status: Home / Self Care | Payer: Medicaid Other | Source: Ambulatory Visit | Attending: Obstetrics | Admitting: Obstetrics

## 2014-02-02 ENCOUNTER — Encounter (HOSPITAL_COMMUNITY): Payer: Self-pay

## 2014-02-02 DIAGNOSIS — Z8249 Family history of ischemic heart disease and other diseases of the circulatory system: Secondary | ICD-10-CM | POA: Diagnosis not present

## 2014-02-02 DIAGNOSIS — K219 Gastro-esophageal reflux disease without esophagitis: Secondary | ICD-10-CM | POA: Diagnosis present

## 2014-02-02 DIAGNOSIS — O24414 Gestational diabetes mellitus in pregnancy, insulin controlled: Secondary | ICD-10-CM

## 2014-02-02 DIAGNOSIS — O99810 Abnormal glucose complicating pregnancy: Secondary | ICD-10-CM | POA: Diagnosis present

## 2014-02-02 DIAGNOSIS — O99214 Obesity complicating childbirth: Secondary | ICD-10-CM | POA: Diagnosis present

## 2014-02-02 DIAGNOSIS — O9903 Anemia complicating the puerperium: Secondary | ICD-10-CM | POA: Diagnosis present

## 2014-02-02 DIAGNOSIS — O99820 Streptococcus B carrier state complicating pregnancy: Secondary | ICD-10-CM

## 2014-02-02 DIAGNOSIS — O99892 Other specified diseases and conditions complicating childbirth: Secondary | ICD-10-CM | POA: Diagnosis present

## 2014-02-02 DIAGNOSIS — O3660X0 Maternal care for excessive fetal growth, unspecified trimester, not applicable or unspecified: Secondary | ICD-10-CM | POA: Diagnosis present

## 2014-02-02 DIAGNOSIS — O1414 Severe pre-eclampsia complicating childbirth: Secondary | ICD-10-CM | POA: Diagnosis not present

## 2014-02-02 DIAGNOSIS — Z794 Long term (current) use of insulin: Secondary | ICD-10-CM

## 2014-02-02 DIAGNOSIS — R188 Other ascites: Secondary | ICD-10-CM | POA: Diagnosis present

## 2014-02-02 DIAGNOSIS — A749 Chlamydial infection, unspecified: Secondary | ICD-10-CM

## 2014-02-02 DIAGNOSIS — O1424 HELLP syndrome, complicating childbirth: Secondary | ICD-10-CM | POA: Diagnosis present

## 2014-02-02 DIAGNOSIS — O99814 Abnormal glucose complicating childbirth: Secondary | ICD-10-CM | POA: Diagnosis present

## 2014-02-02 DIAGNOSIS — O0990 Supervision of high risk pregnancy, unspecified, unspecified trimester: Secondary | ICD-10-CM

## 2014-02-02 DIAGNOSIS — Z6841 Body Mass Index (BMI) 40.0 and over, adult: Secondary | ICD-10-CM

## 2014-02-02 DIAGNOSIS — O1410 Severe pre-eclampsia, unspecified trimester: Secondary | ICD-10-CM | POA: Diagnosis present

## 2014-02-02 DIAGNOSIS — D62 Acute posthemorrhagic anemia: Secondary | ICD-10-CM | POA: Diagnosis present

## 2014-02-02 DIAGNOSIS — O98819 Other maternal infectious and parasitic diseases complicating pregnancy, unspecified trimester: Secondary | ICD-10-CM

## 2014-02-02 DIAGNOSIS — O9989 Other specified diseases and conditions complicating pregnancy, childbirth and the puerperium: Secondary | ICD-10-CM

## 2014-02-02 DIAGNOSIS — N289 Disorder of kidney and ureter, unspecified: Secondary | ICD-10-CM | POA: Diagnosis present

## 2014-02-02 DIAGNOSIS — O99213 Obesity complicating pregnancy, third trimester: Secondary | ICD-10-CM

## 2014-02-02 DIAGNOSIS — E669 Obesity, unspecified: Secondary | ICD-10-CM | POA: Diagnosis present

## 2014-02-02 DIAGNOSIS — Z22330 Carrier of Group B streptococcus: Secondary | ICD-10-CM

## 2014-02-02 HISTORY — DX: Morbid (severe) obesity due to excess calories: E66.01

## 2014-02-02 LAB — CBC
HEMATOCRIT: 27.7 % — AB (ref 36.0–46.0)
Hemoglobin: 9.4 g/dL — ABNORMAL LOW (ref 12.0–15.0)
MCH: 26.7 pg (ref 26.0–34.0)
MCHC: 33.9 g/dL (ref 30.0–36.0)
MCV: 78.7 fL (ref 78.0–100.0)
Platelets: 217 10*3/uL (ref 150–400)
RBC: 3.52 MIL/uL — ABNORMAL LOW (ref 3.87–5.11)
RDW: 14.8 % (ref 11.5–15.5)
WBC: 8.7 10*3/uL (ref 4.0–10.5)

## 2014-02-02 LAB — RPR

## 2014-02-02 LAB — GLUCOSE, CAPILLARY
GLUCOSE-CAPILLARY: 89 mg/dL (ref 70–99)
Glucose-Capillary: 72 mg/dL (ref 70–99)

## 2014-02-02 MED ORDER — OXYTOCIN 40 UNITS IN LACTATED RINGERS INFUSION - SIMPLE MED
1.0000 m[IU]/min | INTRAVENOUS | Status: DC
  Administered 2014-02-03: 2 m[IU]/min via INTRAVENOUS
  Administered 2014-02-03: 1 m[IU]/min via INTRAVENOUS
  Filled 2014-02-02: qty 1000

## 2014-02-02 MED ORDER — INSULIN NPH (HUMAN) (ISOPHANE) 100 UNIT/ML ~~LOC~~ SUSP: 25.0000 [IU] | Freq: Every day | SUBCUTANEOUS | Status: DC

## 2014-02-02 MED ORDER — TERBUTALINE SULFATE 1 MG/ML IJ SOLN: 0.2500 mg | Freq: Once | INTRAMUSCULAR | Status: AC | PRN

## 2014-02-02 MED ORDER — INSULIN ASPART 100 UNIT/ML ~~LOC~~ SOLN
12.0000 [IU] | Freq: Three times a day (TID) | SUBCUTANEOUS | Status: DC
  Administered 2014-02-02: 12 [IU] via SUBCUTANEOUS

## 2014-02-02 MED ORDER — BUTORPHANOL TARTRATE 1 MG/ML IJ SOLN
1.0000 mg | INTRAMUSCULAR | Status: DC | PRN
  Administered 2014-02-02 – 2014-02-03 (×5): 1 mg via INTRAVENOUS
  Filled 2014-02-02 (×5): qty 1

## 2014-02-02 MED ORDER — ACCU-CHEK AVIVA PLUS W/DEVICE KIT: 1.0000 | PACK | Freq: Once | Status: DC

## 2014-02-02 MED ORDER — INSULIN ASPART 100 UNIT/ML ~~LOC~~ SOLN: 0.0000 [IU] | Freq: Three times a day (TID) | SUBCUTANEOUS | Status: DC

## 2014-02-02 MED ORDER — MISOPROSTOL 25 MCG QUARTER TABLET
25.0000 ug | ORAL_TABLET | ORAL | Status: DC | PRN
  Administered 2014-02-02 (×2): 25 ug via VAGINAL
  Filled 2014-02-02 (×2): qty 0.25

## 2014-02-02 MED ORDER — ACETAMINOPHEN 325 MG PO TABS: 325.0000 mg | ORAL_TABLET | Freq: Four times a day (QID) | ORAL | Status: DC | PRN

## 2014-02-02 MED ORDER — ACCU-CHEK SOFTCLIX LANCETS MISC: Freq: Three times a day (TID) | Status: DC

## 2014-02-02 MED ORDER — INSULIN ASPART 100 UNIT/ML ~~LOC~~ SOLN: 0.0000 [IU] | Freq: Four times a day (QID) | SUBCUTANEOUS | Status: DC

## 2014-02-02 MED ORDER — GLUCOSE BLOOD VI STRP: 1.0000 | ORAL_STRIP | Freq: Three times a day (TID) | Status: DC

## 2014-02-02 MED ORDER — PRENATAL MULTIVITAMIN CH: 1.0000 | ORAL_TABLET | Freq: Every day | ORAL | Status: DC

## 2014-02-02 MED ORDER — LACTATED RINGERS IV SOLN
INTRAVENOUS | Status: DC
  Administered 2014-02-02 – 2014-02-03 (×2): via INTRAVENOUS
  Administered 2014-02-04: 1000 mL via INTRAVENOUS

## 2014-02-02 MED ORDER — INSULIN NPH (HUMAN) (ISOPHANE) 100 UNIT/ML ~~LOC~~ SUSP
20.0000 [IU] | Freq: Every day | SUBCUTANEOUS | Status: DC
  Filled 2014-02-02: qty 10

## 2014-02-02 MED ORDER — CYCLOBENZAPRINE HCL 10 MG PO TABS
10.0000 mg | ORAL_TABLET | Freq: Two times a day (BID) | ORAL | Status: DC | PRN
  Filled 2014-02-02: qty 1

## 2014-02-02 NOTE — Progress Notes (Signed)
Felicia Holt is a 18 y.o. G1P0 at 5721w0d by LMP admitted for induction of labor due to Gestational diabetes.  Subjective:   Objective: BP 162/90  Pulse 90  Temp(Src) 98.5 F (36.9 C) (Oral)  Resp 18      FHT:  FHR: 120-130 bpm, variability: moderate,  accelerations:  Present,  decelerations:  Absent UC:   regular, every 2-3 minutes SVE:   Dilation: 1.5 Effacement (%): 60 Station: -2 Exam by:: m wilkins rnc  Procedure:      Cytotec discontinued and foley bulb placed in routine fashion for cervical ripening.  Labs: Lab Results  Component Value Date   WBC 8.7 02/02/2014   HGB 9.4* 02/02/2014   HCT 27.7* 02/02/2014   MCV 78.7 02/02/2014   PLT 217 02/02/2014    Assessment / Plan: 40 weeks.  GDM.  2 stage IOL.  Labor: Latent Phase Preeclampsia:  n/a Fetal Wellbeing:  Category I Pain Control:  Stadol I/D:  n/a Anticipated MOD:  At high risk for C/S because of LGA fetus, Diabetes and morbid obesity.   HARPER,CHARLES A 02/02/2014, 10:25 PM

## 2014-02-02 NOTE — H&P (Signed)
Felicia ChristenJanai Holt is a 18 y.o. female presenting for IOL for gestational diabetes.. History OB History   Grav Para Term Preterm Abortions TAB SAB Ect Mult Living   1         0     Past Medical History  Diagnosis Date  . Gestational diabetes   . GERD (gastroesophageal reflux disease)    Past Surgical History  Procedure Laterality Date  . Tonsillectomy    . Wisdom tooth extraction    . Adenoidectomy     Family History: family history includes COPD in her maternal grandmother; Cancer in her maternal grandmother; Depression in her father, maternal grandmother, and mother; Early death in her paternal grandmother; Hypertension in her father, maternal grandmother, and mother; Learning disabilities in her brother. Social History:  reports that she has never smoked. She has never used smokeless tobacco. She reports that she does not drink alcohol or use illicit drugs.   Prenatal Transfer Tool  Maternal Diabetes: Yes:  Diabetes Type:  Insulin/Medication controlled Genetic Screening: Normal Maternal Ultrasounds/Referrals: Normal Fetal Ultrasounds or other Referrals:  Referred to Materal Fetal Medicine  Maternal Substance Abuse:  No Significant Maternal Medications:  Meds include: Other:  Significant Maternal Lab Results:  None Other Comments:  None  ROS    There were no vitals taken for this visit. Exam Physical Exam  Prenatal labs: ABO, Rh: --/--/AB POS (07/28 0745) Antibody: NEG (07/28 0745) Rubella: 0.56 (01/07 1134) RPR: NON REAC (07/28 0745)  HBsAg: NEGATIVE (01/07 1134)  HIV: NONREACTIVE (05/11 1733)  GBS: POSITIVE (07/02 1656)   Assessment/Plan: 40 weeks.  Gestational Diabetes.  2 stage IOL.   Felicia Holt A 02/02/2014, 7:41 AM

## 2014-02-03 ENCOUNTER — Inpatient Hospital Stay (HOSPITAL_COMMUNITY): Payer: Medicaid Other | Admitting: Anesthesiology

## 2014-02-03 ENCOUNTER — Encounter (HOSPITAL_COMMUNITY)
Admission: RE | Disposition: A | Discharge status: Home / Self Care | Payer: Self-pay | Source: Ambulatory Visit | Attending: Obstetrics

## 2014-02-03 ENCOUNTER — Encounter (HOSPITAL_COMMUNITY): Payer: Self-pay

## 2014-02-03 ENCOUNTER — Encounter (HOSPITAL_COMMUNITY): Payer: Medicaid Other | Admitting: Anesthesiology

## 2014-02-03 DIAGNOSIS — O1410 Severe pre-eclampsia, unspecified trimester: Secondary | ICD-10-CM | POA: Diagnosis present

## 2014-02-03 DIAGNOSIS — O99214 Obesity complicating childbirth: Secondary | ICD-10-CM

## 2014-02-03 DIAGNOSIS — O1414 Severe pre-eclampsia complicating childbirth: Secondary | ICD-10-CM

## 2014-02-03 DIAGNOSIS — E669 Obesity, unspecified: Secondary | ICD-10-CM

## 2014-02-03 LAB — COMPREHENSIVE METABOLIC PANEL
ALK PHOS: 133 U/L — AB (ref 39–117)
ALT: 29 U/L (ref 0–35)
ALT: 28 U/L (ref 0–35)
AST: 30 U/L (ref 0–37)
AST: 29 U/L (ref 0–37)
Albumin: 2.2 g/dL — ABNORMAL LOW (ref 3.5–5.2)
Albumin: 2.2 g/dL — ABNORMAL LOW (ref 3.5–5.2)
Alkaline Phosphatase: 142 U/L — ABNORMAL HIGH (ref 39–117)
Anion gap: 16 — ABNORMAL HIGH (ref 5–15)
Anion gap: 16 — ABNORMAL HIGH (ref 5–15)
BILIRUBIN TOTAL: 0.2 mg/dL — AB (ref 0.3–1.2)
BUN: 17 mg/dL (ref 6–23)
BUN: 20 mg/dL (ref 6–23)
CALCIUM: 8.6 mg/dL (ref 8.4–10.5)
CHLORIDE: 101 meq/L (ref 96–112)
CO2: 16 meq/L — AB (ref 19–32)
CO2: 17 meq/L — AB (ref 19–32)
CREATININE: 2.38 mg/dL — AB (ref 0.50–1.10)
Calcium: 8.3 mg/dL — ABNORMAL LOW (ref 8.4–10.5)
Chloride: 104 mEq/L (ref 96–112)
Creatinine, Ser: 1.61 mg/dL — ABNORMAL HIGH (ref 0.50–1.10)
GFR calc Af Amer: 33 mL/min — ABNORMAL LOW (ref >90)
GFR calc non Af Amer: 28 mL/min — ABNORMAL LOW (ref >90)
GFR, EST AFRICAN AMERICAN: 52 mL/min — AB (ref >90)
GFR, EST NON AFRICAN AMERICAN: 45 mL/min — AB (ref >90)
Glucose, Bld: 87 mg/dL (ref 70–99)
Glucose, Bld: 92 mg/dL (ref 70–99)
Potassium: 3.9 mEq/L (ref 3.7–5.3)
Potassium: 4 mEq/L (ref 3.7–5.3)
SODIUM: 136 meq/L — AB (ref 137–147)
Sodium: 134 mEq/L — ABNORMAL LOW (ref 137–147)
Total Bilirubin: 0.3 mg/dL (ref 0.3–1.2)
Total Protein: 6.8 g/dL (ref 6.0–8.3)
Total Protein: 7.1 g/dL (ref 6.0–8.3)

## 2014-02-03 LAB — CBC
HCT: 30.8 % — ABNORMAL LOW (ref 36.0–46.0)
HCT: 34 % — ABNORMAL LOW (ref 36.0–46.0)
HEMOGLOBIN: 10.2 g/dL — AB (ref 12.0–15.0)
HEMOGLOBIN: 11.4 g/dL — AB (ref 12.0–15.0)
MCH: 26 pg (ref 26.0–34.0)
MCH: 26.3 pg (ref 26.0–34.0)
MCHC: 33.1 g/dL (ref 30.0–36.0)
MCHC: 33.5 g/dL (ref 30.0–36.0)
MCV: 78.4 fL (ref 78.0–100.0)
MCV: 78.5 fL (ref 78.0–100.0)
PLATELETS: 215 10*3/uL (ref 150–400)
Platelets: 253 10*3/uL (ref 150–400)
RBC: 3.93 MIL/uL (ref 3.87–5.11)
RBC: 4.33 MIL/uL (ref 3.87–5.11)
RDW: 14.7 % (ref 11.5–15.5)
RDW: 14.9 % (ref 11.5–15.5)
WBC: 11.7 10*3/uL — ABNORMAL HIGH (ref 4.0–10.5)
WBC: 13.4 10*3/uL — ABNORMAL HIGH (ref 4.0–10.5)

## 2014-02-03 LAB — GLUCOSE, CAPILLARY
GLUCOSE-CAPILLARY: 158 mg/dL — AB (ref 70–99)
Glucose-Capillary: 89 mg/dL (ref 70–99)
Glucose-Capillary: 88 mg/dL (ref 70–99)
Glucose-Capillary: 106 mg/dL — ABNORMAL HIGH (ref 70–99)
Glucose-Capillary: 99 mg/dL (ref 70–99)

## 2014-02-03 LAB — LACTATE DEHYDROGENASE
LDH: 261 U/L — ABNORMAL HIGH (ref 94–250)
LDH: 296 U/L — AB (ref 94–250)

## 2014-02-03 LAB — MAGNESIUM: Magnesium: 6.4 mg/dL (ref 1.5–2.5)

## 2014-02-03 LAB — SAVE SMEAR

## 2014-02-03 SURGERY — Surgical Case
Anesthesia: Epidural | Site: Abdomen

## 2014-02-03 MED ORDER — BUTORPHANOL TARTRATE 1 MG/ML IJ SOLN
2.0000 mg | Freq: Once | INTRAMUSCULAR | Status: AC
  Administered 2014-02-03: 2 mg via INTRAVENOUS
  Filled 2014-02-03: qty 2

## 2014-02-03 MED ORDER — INSULIN ASPART 100 UNIT/ML ~~LOC~~ SOLN: 0.0000 [IU] | SUBCUTANEOUS | Status: DC

## 2014-02-03 MED ORDER — FENTANYL 2.5 MCG/ML BUPIVACAINE 1/10 % EPIDURAL INFUSION (WH - ANES)
INTRAMUSCULAR | Status: DC | PRN
  Administered 2014-02-03: 14 mL/h via EPIDURAL

## 2014-02-03 MED ORDER — FENTANYL 2.5 MCG/ML BUPIVACAINE 1/10 % EPIDURAL INFUSION (WH - ANES)
INTRAMUSCULAR | Status: AC
  Filled 2014-02-03: qty 125

## 2014-02-03 MED ORDER — CITRIC ACID-SODIUM CITRATE 334-500 MG/5ML PO SOLN
ORAL | Status: AC
  Administered 2014-02-03: 30 mL via ORAL
  Filled 2014-02-03: qty 15

## 2014-02-03 MED ORDER — EPHEDRINE 5 MG/ML INJ: 10.0000 mg | INTRAVENOUS | Status: DC | PRN

## 2014-02-03 MED ORDER — LACTATED RINGERS IV SOLN
INTRAVENOUS | Status: DC | PRN
  Administered 2014-02-03 (×3): via INTRAVENOUS

## 2014-02-03 MED ORDER — MORPHINE SULFATE 0.5 MG/ML IJ SOLN
INTRAMUSCULAR | Status: AC
  Filled 2014-02-03: qty 10

## 2014-02-03 MED ORDER — PHENYLEPHRINE 40 MCG/ML (10ML) SYRINGE FOR IV PUSH (FOR BLOOD PRESSURE SUPPORT): 80.0000 ug | PREFILLED_SYRINGE | INTRAVENOUS | Status: DC | PRN

## 2014-02-03 MED ORDER — PROMETHAZINE HCL 25 MG/ML IJ SOLN
25.0000 mg | Freq: Four times a day (QID) | INTRAMUSCULAR | Status: DC | PRN
  Filled 2014-02-03: qty 1

## 2014-02-03 MED ORDER — LABETALOL HCL 200 MG PO TABS
200.0000 mg | ORAL_TABLET | Freq: Three times a day (TID) | ORAL | Status: DC
  Filled 2014-02-03 (×4): qty 1

## 2014-02-03 MED ORDER — FENTANYL CITRATE 0.05 MG/ML IJ SOLN
INTRAMUSCULAR | Status: AC
  Filled 2014-02-03: qty 2

## 2014-02-03 MED ORDER — MORPHINE SULFATE (PF) 0.5 MG/ML IJ SOLN
INTRAMUSCULAR | Status: DC | PRN
  Administered 2014-02-03 (×2): 1 mg via EPIDURAL

## 2014-02-03 MED ORDER — PHENYLEPHRINE 40 MCG/ML (10ML) SYRINGE FOR IV PUSH (FOR BLOOD PRESSURE SUPPORT)
PREFILLED_SYRINGE | INTRAVENOUS | Status: AC
  Filled 2014-02-03: qty 5

## 2014-02-03 MED ORDER — MEPERIDINE HCL 25 MG/ML IJ SOLN
INTRAMUSCULAR | Status: DC | PRN
  Administered 2014-02-03 (×2): 12.5 mg via INTRAVENOUS

## 2014-02-03 MED ORDER — SODIUM BICARBONATE 8.4 % IV SOLN
INTRAVENOUS | Status: DC | PRN
  Administered 2014-02-03: 30 mL via EPIDURAL

## 2014-02-03 MED ORDER — CEFAZOLIN SODIUM-DEXTROSE 2-3 GM-% IV SOLR
INTRAVENOUS | Status: AC
  Filled 2014-02-03: qty 50

## 2014-02-03 MED ORDER — FENTANYL 2.5 MCG/ML BUPIVACAINE 1/10 % EPIDURAL INFUSION (WH - ANES)
14.0000 mL/h | INTRAMUSCULAR | Status: DC | PRN
  Administered 2014-02-03: 14 mL/h via EPIDURAL
  Filled 2014-02-03: qty 125

## 2014-02-03 MED ORDER — LACTATED RINGERS IV SOLN
INTRAVENOUS | Status: DC | PRN
  Administered 2014-02-03 (×2): via INTRAVENOUS

## 2014-02-03 MED ORDER — MAGNESIUM SULFATE BOLUS VIA INFUSION
4.0000 g | Freq: Once | INTRAVENOUS | Status: AC
  Administered 2014-02-03: 4 g via INTRAVENOUS
  Filled 2014-02-03: qty 500

## 2014-02-03 MED ORDER — FENTANYL CITRATE 0.05 MG/ML IJ SOLN
INTRAMUSCULAR | Status: DC | PRN
  Administered 2014-02-03 (×3): 50 ug via INTRAVENOUS

## 2014-02-03 MED ORDER — CHLOROPROCAINE HCL 3 % IJ SOLN
INTRAMUSCULAR | Status: AC
  Filled 2014-02-03: qty 20

## 2014-02-03 MED ORDER — OXYTOCIN 10 UNIT/ML IJ SOLN
INTRAMUSCULAR | Status: AC
  Filled 2014-02-03: qty 4

## 2014-02-03 MED ORDER — MORPHINE SULFATE (PF) 0.5 MG/ML IJ SOLN
INTRAMUSCULAR | Status: DC | PRN
  Administered 2014-02-03: 3 mg via EPIDURAL

## 2014-02-03 MED ORDER — LIDOCAINE-EPINEPHRINE (PF) 2 %-1:200000 IJ SOLN
INTRAMUSCULAR | Status: AC
  Filled 2014-02-03: qty 20

## 2014-02-03 MED ORDER — SODIUM BICARBONATE 8.4 % IV SOLN
INTRAVENOUS | Status: AC
  Filled 2014-02-03: qty 50

## 2014-02-03 MED ORDER — PHENYLEPHRINE 40 MCG/ML (10ML) SYRINGE FOR IV PUSH (FOR BLOOD PRESSURE SUPPORT)
PREFILLED_SYRINGE | INTRAVENOUS | Status: AC
  Filled 2014-02-03: qty 10

## 2014-02-03 MED ORDER — ONDANSETRON HCL 4 MG/2ML IJ SOLN
INTRAMUSCULAR | Status: AC
  Filled 2014-02-03: qty 2

## 2014-02-03 MED ORDER — PENICILLIN G POTASSIUM 5000000 UNITS IJ SOLR
2.5000 10*6.[IU] | INTRAVENOUS | Status: DC
  Administered 2014-02-03 (×4): 2.5 10*6.[IU] via INTRAVENOUS
  Filled 2014-02-03 (×6): qty 2.5

## 2014-02-03 MED ORDER — LACTATED RINGERS IV SOLN
500.0000 mL | Freq: Once | INTRAVENOUS | Status: AC
  Administered 2014-02-03: 1000 mL via INTRAVENOUS

## 2014-02-03 MED ORDER — OXYTOCIN 10 UNIT/ML IJ SOLN
40.0000 [IU] | INTRAVENOUS | Status: DC | PRN
  Administered 2014-02-03: 40 [IU] via INTRAVENOUS

## 2014-02-03 MED ORDER — LIDOCAINE HCL (PF) 1 % IJ SOLN
INTRAMUSCULAR | Status: DC | PRN
  Administered 2014-02-03 (×2): 9 mL

## 2014-02-03 MED ORDER — MEPERIDINE HCL 25 MG/ML IJ SOLN: 6.2500 mg | INTRAMUSCULAR | Status: DC | PRN

## 2014-02-03 MED ORDER — CITRIC ACID-SODIUM CITRATE 334-500 MG/5ML PO SOLN
30.0000 mL | Freq: Once | ORAL | Status: AC
  Administered 2014-02-03: 30 mL via ORAL

## 2014-02-03 MED ORDER — PENICILLIN G POTASSIUM 5000000 UNITS IJ SOLR
5.0000 10*6.[IU] | Freq: Once | INTRAVENOUS | Status: AC
  Administered 2014-02-03: 5 10*6.[IU] via INTRAVENOUS
  Filled 2014-02-03: qty 5

## 2014-02-03 MED ORDER — INSULIN REGULAR HUMAN 100 UNIT/ML IJ SOLN
INTRAMUSCULAR | Status: DC
  Filled 2014-02-03: qty 1

## 2014-02-03 MED ORDER — SODIUM BICARBONATE 8.4 % IV SOLN
INTRAVENOUS | Status: DC | PRN
  Administered 2014-02-03 (×4): 5 mL via EPIDURAL

## 2014-02-03 MED ORDER — DEXTROSE 5 % IV SOLN
500.0000 mg | INTRAVENOUS | Status: DC
  Administered 2014-02-03: 500 mg via INTRAVENOUS
  Filled 2014-02-03: qty 500

## 2014-02-03 MED ORDER — LABETALOL HCL 200 MG PO TABS
200.0000 mg | ORAL_TABLET | Freq: Three times a day (TID) | ORAL | Status: DC
  Administered 2014-02-03: 200 mg via ORAL
  Filled 2014-02-03 (×4): qty 1

## 2014-02-03 MED ORDER — DIPHENHYDRAMINE HCL 50 MG/ML IJ SOLN: 12.5000 mg | INTRAMUSCULAR | Status: DC | PRN

## 2014-02-03 MED ORDER — MAGNESIUM SULFATE 40 G IN LACTATED RINGERS - SIMPLE
0.5000 g/h | INTRAVENOUS | Status: DC
  Administered 2014-02-03: 0.5 g/h via INTRAVENOUS
  Filled 2014-02-03: qty 500

## 2014-02-03 MED ORDER — LACTATED RINGERS IV BOLUS (SEPSIS)
500.0000 mL | Freq: Once | INTRAVENOUS | Status: AC
  Administered 2014-02-03: 500 mL via INTRAVENOUS

## 2014-02-03 MED ORDER — DEXTROSE IN LACTATED RINGERS 5 % IV SOLN: INTRAVENOUS | Status: DC

## 2014-02-03 MED ORDER — PHENYLEPHRINE HCL 10 MG/ML IJ SOLN
INTRAMUSCULAR | Status: DC | PRN
  Administered 2014-02-03 (×3): 20 ug via INTRAVENOUS

## 2014-02-03 MED ORDER — MEPERIDINE HCL 25 MG/ML IJ SOLN
INTRAMUSCULAR | Status: AC
  Filled 2014-02-03: qty 1

## 2014-02-03 MED ORDER — MAGNESIUM SULFATE 40 G IN LACTATED RINGERS - SIMPLE
1.0000 g/h | INTRAVENOUS | Status: DC
  Filled 2014-02-03: qty 500

## 2014-02-03 MED ORDER — ONDANSETRON HCL 4 MG/2ML IJ SOLN
INTRAMUSCULAR | Status: DC | PRN
  Administered 2014-02-03: 4 mg via INTRAVENOUS

## 2014-02-03 MED ORDER — TERBUTALINE SULFATE 1 MG/ML IJ SOLN: 0.2500 mg | Freq: Once | INTRAMUSCULAR | Status: DC | PRN

## 2014-02-03 MED ORDER — FENTANYL CITRATE 0.05 MG/ML IJ SOLN
25.0000 ug | INTRAMUSCULAR | Status: DC | PRN
  Administered 2014-02-04 (×2): 50 ug via INTRAVENOUS

## 2014-02-03 SURGICAL SUPPLY — 44 items
BENZOIN TINCTURE PRP APPL 2/3 (GAUZE/BANDAGES/DRESSINGS) ×3 IMPLANT
BLADE SURG 10 STRL SS (BLADE) ×6 IMPLANT
CANISTER WOUND CARE 500ML ATS (WOUND CARE) IMPLANT
CLAMP CORD UMBIL (MISCELLANEOUS) IMPLANT
CLOSURE WOUND 1/2 X4 (GAUZE/BANDAGES/DRESSINGS) ×1
CLOTH BEACON ORANGE TIMEOUT ST (SAFETY) ×3 IMPLANT
CONTAINER PREFILL 10% NBF 15ML (MISCELLANEOUS) IMPLANT
DRAPE LG THREE QUARTER DISP (DRAPES) IMPLANT
DRESSING DISP NPWT PICO 4X12 (MISCELLANEOUS) ×3 IMPLANT
DRSG OPSITE POSTOP 4X10 (GAUZE/BANDAGES/DRESSINGS) ×3 IMPLANT
DRSG VAC ATS LRG SENSATRAC (GAUZE/BANDAGES/DRESSINGS) IMPLANT
DRSG VAC ATS SM SENSATRAC (GAUZE/BANDAGES/DRESSINGS) IMPLANT
DURAPREP 26ML APPLICATOR (WOUND CARE) ×3 IMPLANT
ELECT REM PT RETURN 9FT ADLT (ELECTROSURGICAL) ×3
ELECTRODE REM PT RTRN 9FT ADLT (ELECTROSURGICAL) ×1 IMPLANT
EXTRACTOR VACUUM M CUP 4 TUBE (SUCTIONS) IMPLANT
EXTRACTOR VACUUM M CUP 4' TUBE (SUCTIONS)
GLOVE BIO SURGEON STRL SZ 6.5 (GLOVE) ×2 IMPLANT
GLOVE BIO SURGEONS STRL SZ 6.5 (GLOVE) ×1
GOWN STRL REUS W/TWL LRG LVL3 (GOWN DISPOSABLE) ×6 IMPLANT
KIT ABG SYR 3ML LUER SLIP (SYRINGE) IMPLANT
NEEDLE HYPO 25X5/8 SAFETYGLIDE (NEEDLE) ×3 IMPLANT
NS IRRIG 1000ML POUR BTL (IV SOLUTION) ×3 IMPLANT
PACK C SECTION WH (CUSTOM PROCEDURE TRAY) ×3 IMPLANT
PAD OB MATERNITY 4.3X12.25 (PERSONAL CARE ITEMS) ×3 IMPLANT
RTRCTR C-SECT PINK 25CM LRG (MISCELLANEOUS) ×3 IMPLANT
SCRUB PCMX 4 OZ (MISCELLANEOUS) ×3 IMPLANT
STAPLER VISISTAT 35W (STAPLE) IMPLANT
STRIP CLOSURE SKIN 1/2X4 (GAUZE/BANDAGES/DRESSINGS) ×2 IMPLANT
SUT MNCRL 0 VIOLET CTX 36 (SUTURE) ×3 IMPLANT
SUT MNCRL AB 3-0 PS2 27 (SUTURE) IMPLANT
SUT MONOCRYL 0 CTX 36 (SUTURE) ×6
SUT PDS AB 0 CTX 36 PDP370T (SUTURE) IMPLANT
SUT PDS AB 0 CTX 60 (SUTURE) ×6 IMPLANT
SUT PLAIN 0 NONE (SUTURE) IMPLANT
SUT VIC AB 0 CTXB 36 (SUTURE) IMPLANT
SUT VIC AB 2-0 CT1 (SUTURE) ×3 IMPLANT
SUT VIC AB 2-0 CT1 27 (SUTURE) ×2
SUT VIC AB 2-0 CT1 TAPERPNT 27 (SUTURE) ×1 IMPLANT
SUT VIC AB 2-0 SH 27 (SUTURE)
SUT VIC AB 2-0 SH 27XBRD (SUTURE) IMPLANT
TOWEL OR 17X24 6PK STRL BLUE (TOWEL DISPOSABLE) ×3 IMPLANT
TRAY FOLEY CATH 14FR (SET/KITS/TRAYS/PACK) ×3 IMPLANT
WATER STERILE IRR 1000ML POUR (IV SOLUTION) ×3 IMPLANT

## 2014-02-03 NOTE — Consult Note (Signed)
Neonatology Note:   Attendance at C-section:    I was asked by Dr. Tamela OddiJackson-Moore to attend this primary C/S at term due to patient with renal insufficiency, severe pre-eclampsia . The mother is a G1P0 AB pos, GBS positive with GDM, on insulin. She has been on labetalol and magnesium sulfate; her magnesium level is 6.4 this evening. She has also gotten Pen G > 4 hours before delivery and has been afebrile. ROM 21 hours prior to delivery, fluid clear. Infant vigorous with good spontaneous cry and tone. Needed bulb suctioning. Ap 8/9. Lungs clear to ausc in DR. To CN to care of Pediatrician.   Doretha Souhristie C. Rilyn Upshaw, MD

## 2014-02-03 NOTE — Anesthesia Preprocedure Evaluation (Signed)
Anesthesia Evaluation  Patient identified by MRN, date of birth, ID band Patient awake    Reviewed: Allergy & Precautions, H&P , NPO status , Patient's Chart, lab work & pertinent test results  Airway Mallampati: III TM Distance: >3 FB Neck ROM: full    Dental no notable dental hx.    Pulmonary neg pulmonary ROS,    Pulmonary exam normal       Cardiovascular negative cardio ROS      Neuro/Psych negative neurological ROS     GI/Hepatic Neg liver ROS, GERD-  Medicated,  Endo/Other  diabetesMorbid obesity  Renal/GU negative Renal ROS     Musculoskeletal   Abdominal (+) + obese,   Peds  Hematology negative hematology ROS (+)   Anesthesia Other Findings   Reproductive/Obstetrics (+) Pregnancy                           Anesthesia Physical Anesthesia Plan  ASA: III  Anesthesia Plan: Epidural   Post-op Pain Management:    Induction:   Airway Management Planned:   Additional Equipment:   Intra-op Plan:   Post-operative Plan:   Informed Consent: I have reviewed the patients History and Physical, chart, labs and discussed the procedure including the risks, benefits and alternatives for the proposed anesthesia with the patient or authorized representative who has indicated his/her understanding and acceptance.     Plan Discussed with:   Anesthesia Plan Comments:         Anesthesia Quick Evaluation

## 2014-02-03 NOTE — Progress Notes (Signed)
Patient ID: Felicia ChristenJanai Holt, female   DOB: 1996-04-13, 18 y.o.   MRN: [id number] Felicia Holt is a 18 y.o. G1P0 at 5348w0d by LMP admitted for induction of labor due to Gestational diabetes, preeclampsia with severe features  Subjective: Comfortable  Objective: BP 104/86  Pulse 94  Temp(Src) 98.3 F (36.8 C) (Oral)  Resp 20  Ht 5\' 2"  (1.575 m)  Wt 127.914 kg (282 lb)  BMI 51.57 kg/m2  SpO2 99% I/O last 3 completed shifts: In: 2185.3 [P.O.:600; I.V.:1285.3; IV Piggyback:300] Out: 610 [Urine:610] Total I/O In: 123.3 [I.V.:123.3] Out: 0   FHT:  FHR: 120-130 bpm, variability: moderate,  accelerations:  Present,  decelerations:  Absent UC:   regular, every 2-3 minutes SVE:   Dilation: 6 Effacement (%): 80 Station: -2 Exam by:: Dr Tamela OddiJackson-Moore    Labs: Lab Results  Component Value Date   WBC 13.4* 02/03/2014   HGB 11.4* 02/03/2014   HCT 34.0* 02/03/2014   MCV 78.5 02/03/2014   PLT 253 02/03/2014   Lab Results  Component Value Date   CREATININE 2.38* 02/03/2014   CREATININE 1.61* 02/03/2014   CREATININE 0.59 11/25/2013   Recent Labs     02/03/14  1817  MG  6.4*    CBG (last 3)   Recent Labs  02/03/14 1106 02/03/14 1608 02/03/14 1954  GLUCAP 88 106* 99      Assessment / Plan: 48104w1d weeks.  GDM-insulin requiring.  Preeclampsia with severe features.  Acute renal insufficiency--worsening renal function   Labor: Latent Phase Preeclampsia:  hold MgSO4 for now Fetal Wellbeing:  Category I Pain Control:  Epidural I/D:  n/a Anticipated MOD:  C/D--consent obtained   Gappa-MOORE,Amrom Ore A 02/03/2014, 8:41 PM

## 2014-02-03 NOTE — Transfer of Care (Signed)
Immediate Anesthesia Transfer of Care Note  Patient: Felicia Holt  Procedure(s) Performed: Procedure(s): CESAREAN SECTION (N/A)  Patient Location: PACU  Anesthesia Type:Epidural  Level of Consciousness: awake, oriented and patient cooperative  Airway & Oxygen Therapy: Patient Spontanous Breathing  Post-op Assessment: Report given to PACU RN and Post -op Vital signs reviewed and stable  Post vital signs: Reviewed and stable  Complications: No apparent anesthesia complications

## 2014-02-03 NOTE — Progress Notes (Signed)
Patient ID: Felicia Holt, female   DOB: 1996/01/05, 18 y.o.   MRN: [id number] Felicia Holt is a 18 y.o. G1P0 at 6329w0d by LMP admitted for induction of labor due to Gestational diabetes, severe PIH  Subjective: Uncomfortable w/UCs  Objective: BP 161/98  Pulse 81  Temp(Src) 98.4 F (36.9 C) (Oral)  Resp 20  Ht 5\' 2"  (1.575 m)  Wt 127.914 kg (282 lb)  BMI 51.57 kg/m2      FHT:  FHR: 120-130 bpm, variability: moderate,  accelerations:  Present,  decelerations:  Absent UC:   regular, every 2-3 minutes SVE:   Dilation: 1.5 Effacement (%): 80 Station: -2 Exam by:: e. poore, rn  IUPC placed; ?small forebag  Labs: Lab Results  Component Value Date   WBC 11.7* 02/03/2014   HGB 10.2* 02/03/2014   HCT 30.8* 02/03/2014   MCV 78.4 02/03/2014   PLT 215 02/03/2014   CBG (last 3)   Recent Labs  02/02/14 1253 02/02/14 2138 02/03/14 0705  GLUCAP 72 89 89      Assessment / Plan: 5030w1d weeks.  GDM-insulin requiring.  Severe PIH.   Labor: Latent Phase Preeclampsia:  See above; PIH labs; MgSO4 for seizure prophylaxis Fetal Wellbeing:  Category I Pain Control:  Stadol I/D:  n/a Anticipated MOD:  NSVD   Gipson-MOORE,Netanel Yannuzzi A 02/03/2014, 8:21 AM

## 2014-02-03 NOTE — Op Note (Signed)
Cesarean Section Procedure Note   Felicia ChristenJanai Holt   02/02/2014 - 02/03/2014  Indications: Preeclampsia with severe features, acute renal insufficiency, gestational diabetes--insulin requiring, latent labor, morbid obesity   Pre-operative Diagnosis: Preeclampsia with severe features, acute renal insufficiency, gestational diabetes--insulin requiring, latent labor, morbid obesity  Post-operative Diagnosis: Same  Surgeon: Roseanna RainbowJACKSON-MOORE,Jadia Capers A  Assistants: Coral CeoHARPER, CHARLES a  Anesthesia: epidural  Procedure Details:  The patient was seen in the Holding Room. The risks, benefits, complications, treatment options, and expected outcomes were discussed with the patient. The patient concurred with the proposed plan, giving informed consent. The patient was identified as Felicia Holt and the procedure verified as C-Section Delivery. A Time Out was held and the above information confirmed.  The patient was draped and prepped in the usual sterile manner. A midline incision was made and carried down through the subcutaneous tissue to the fascia. The fascial incision was made and extended. The peritoneum was identified and entered. The peritoneal incision was extended longitudinally. An Alexis retractor was placed into the incision and bowel was packed away with moistened laparotomy packs and blue folded towel.  The utero-vesical peritoneal reflection was incised transversely and the bladder flap was bluntly freed from the lower uterine segment. A low transverse uterine incision was made. Delivered from cephalic presentation was a living newborn female infant. APGAR (1 MIN): 8   APGAR (5 MINS): 9       A cord ph was not sent. The umbilical cord was clamped and cut cord. A sample was obtained for evaluation. The placenta was removed Intact and appeared normal.  The uterine incision was closed with running locked sutures of 1-0 Monocryl. A second imbricating layer of the same suture was placed.  Hemostasis was  observed. The paracolic gutters were irrigated. The fascia and parieto peritoneum were closed in a running fashion as a single layer with 0-PDS.  A running suture of 2-0 Vicryl was placed in the subcutaneous layer.   The skin was closed with staples.  A PICO negative pressure wound dressing was applied.  Instrument, sponge, and needle counts were correct prior the abdominal closure and were correct at the conclusion of the case.    Findings:  See above; several 100 ml of ascites   Estimated Blood Loss: 600 ml  Total IV Fluids: per Anesthesiology  Urine Output: per Anesthesiology  Specimens: Placenta  Complications: no complications  Disposition: PACU - hemodynamically stable.  Maternal Condition: stable   Baby condition / location:  Couplet care / Skin to Skin    Signed: Surgeon(s): Antionette CharLisa Hosek-Moore, MD

## 2014-02-03 NOTE — Anesthesia Procedure Notes (Signed)
Epidural Patient location during procedure: OB Start time: 02/03/2014 8:25 AM End time: 02/03/2014 8:29 AM  Staffing Anesthesiologist: Leilani AbleHATCHETT, Annalese Stiner Performed by: anesthesiologist   Preanesthetic Checklist Completed: patient identified, surgical consent, pre-op evaluation, timeout performed, IV checked, risks and benefits discussed and monitors and equipment checked  Epidural Patient position: sitting Prep: site prepped and draped and DuraPrep Patient monitoring: continuous pulse ox and blood pressure Approach: midline Location: L3-L4 Injection technique: LOR air  Needle:  Needle type: Tuohy  Needle gauge: 17 G Needle length: 9 cm and 9 Needle insertion depth: 6 cm Catheter type: closed end flexible Catheter size: 19 Gauge Catheter at skin depth: 13 cm Test dose: negative and Other  Assessment Sensory level: T9 Events: blood not aspirated, injection not painful, no injection resistance, negative IV test and no paresthesia  Additional Notes Reason for block:procedure for pain

## 2014-02-04 ENCOUNTER — Encounter: Payer: Medicaid Other | Admitting: Obstetrics & Gynecology

## 2014-02-04 DIAGNOSIS — D62 Acute posthemorrhagic anemia: Secondary | ICD-10-CM | POA: Diagnosis not present

## 2014-02-04 LAB — COMPREHENSIVE METABOLIC PANEL
ALBUMIN: 1.4 g/dL — AB (ref 3.5–5.2)
ALK PHOS: 81 U/L (ref 39–117)
ALK PHOS: 83 U/L (ref 39–117)
ALT: 15 U/L (ref 0–35)
ALT: 14 U/L (ref 0–35)
ANION GAP: 12 (ref 5–15)
ANION GAP: 11 (ref 5–15)
AST: 21 U/L (ref 0–37)
AST: 28 U/L (ref 0–37)
Albumin: 1.3 g/dL — ABNORMAL LOW (ref 3.5–5.2)
BILIRUBIN TOTAL: 0.3 mg/dL (ref 0.3–1.2)
BUN: 22 mg/dL (ref 6–23)
BUN: 20 mg/dL (ref 6–23)
CHLORIDE: 107 meq/L (ref 96–112)
CHLORIDE: 106 meq/L (ref 96–112)
CO2: 20 mEq/L (ref 19–32)
CO2: 20 mEq/L (ref 19–32)
CREATININE: 2.12 mg/dL — AB (ref 0.50–1.10)
Calcium: 7.2 mg/dL — ABNORMAL LOW (ref 8.4–10.5)
Calcium: 7.3 mg/dL — ABNORMAL LOW (ref 8.4–10.5)
Creatinine, Ser: 1.81 mg/dL — ABNORMAL HIGH (ref 0.50–1.10)
GFR calc Af Amer: 46 mL/min — ABNORMAL LOW (ref >90)
GFR calc non Af Amer: 32 mL/min — ABNORMAL LOW (ref >90)
GFR calc non Af Amer: 39 mL/min — ABNORMAL LOW (ref >90)
GFR, EST AFRICAN AMERICAN: 38 mL/min — AB (ref >90)
Glucose, Bld: 141 mg/dL — ABNORMAL HIGH (ref 70–99)
Glucose, Bld: 135 mg/dL — ABNORMAL HIGH (ref 70–99)
POTASSIUM: 4.2 meq/L (ref 3.7–5.3)
Potassium: 4.5 mEq/L (ref 3.7–5.3)
SODIUM: 137 meq/L (ref 137–147)
Sodium: 139 mEq/L (ref 137–147)
Total Protein: 4.3 g/dL — ABNORMAL LOW (ref 6.0–8.3)
Total Protein: 4.5 g/dL — ABNORMAL LOW (ref 6.0–8.3)

## 2014-02-04 LAB — CBC
HCT: 28 % — ABNORMAL LOW (ref 36.0–46.0)
HEMATOCRIT: 20.3 % — AB (ref 36.0–46.0)
HEMATOCRIT: 22.3 % — AB (ref 36.0–46.0)
Hemoglobin: 7 g/dL — ABNORMAL LOW (ref 12.0–15.0)
Hemoglobin: 7.6 g/dL — ABNORMAL LOW (ref 12.0–15.0)
Hemoglobin: 9.2 g/dL — ABNORMAL LOW (ref 12.0–15.0)
MCH: 25.5 pg — ABNORMAL LOW (ref 26.0–34.0)
MCH: 27 pg (ref 26.0–34.0)
MCH: 27.2 pg (ref 26.0–34.0)
MCHC: 32.9 g/dL (ref 30.0–36.0)
MCHC: 34.1 g/dL (ref 30.0–36.0)
MCHC: 34.5 g/dL (ref 30.0–36.0)
MCV: 77.6 fL — ABNORMAL LOW (ref 78.0–100.0)
MCV: 79 fL (ref 78.0–100.0)
MCV: 79.1 fL (ref 78.0–100.0)
Platelets: 128 10*3/uL — ABNORMAL LOW (ref 150–400)
Platelets: 148 10*3/uL — ABNORMAL LOW (ref 150–400)
Platelets: 223 10*3/uL (ref 150–400)
RBC: 2.57 MIL/uL — ABNORMAL LOW (ref 3.87–5.11)
RBC: 2.82 MIL/uL — ABNORMAL LOW (ref 3.87–5.11)
RBC: 3.61 MIL/uL — AB (ref 3.87–5.11)
RDW: 14.6 % (ref 11.5–15.5)
RDW: 14.7 % (ref 11.5–15.5)
RDW: 14.9 % (ref 11.5–15.5)
WBC: 16.2 10*3/uL — AB (ref 4.0–10.5)
WBC: 16.9 10*3/uL — ABNORMAL HIGH (ref 4.0–10.5)
WBC: 17.3 10*3/uL — ABNORMAL HIGH (ref 4.0–10.5)

## 2014-02-04 LAB — LACTATE DEHYDROGENASE: LDH: 427 U/L — ABNORMAL HIGH (ref 94–250)

## 2014-02-04 LAB — PROTIME-INR
INR: 1.08 (ref 0.00–1.49)
Prothrombin Time: 14 seconds (ref 11.6–15.2)

## 2014-02-04 LAB — APTT: aPTT: 30 seconds (ref 24–37)

## 2014-02-04 LAB — FIBRINOGEN: FIBRINOGEN: 354 mg/dL (ref 204–475)

## 2014-02-04 LAB — D-DIMER, QUANTITATIVE (NOT AT ARMC): D DIMER QUANT: 19.71 ug{FEU}/mL — AB (ref 0.00–0.48)

## 2014-02-04 LAB — HEMOGLOBIN AND HEMATOCRIT, BLOOD
HCT: 19.1 % — ABNORMAL LOW (ref 36.0–46.0)
Hemoglobin: 6.4 g/dL — CL (ref 12.0–15.0)

## 2014-02-04 LAB — PREPARE RBC (CROSSMATCH)

## 2014-02-04 LAB — SAVE SMEAR

## 2014-02-04 LAB — MAGNESIUM: Magnesium: 4.3 mg/dL — ABNORMAL HIGH (ref 1.5–2.5)

## 2014-02-04 MED ORDER — KETOROLAC TROMETHAMINE 30 MG/ML IJ SOLN
INTRAMUSCULAR | Status: AC
  Filled 2014-02-04: qty 1

## 2014-02-04 MED ORDER — LACTATED RINGERS IV SOLN
INTRAVENOUS | Status: DC
  Administered 2014-02-04 – 2014-02-05 (×2): via INTRAVENOUS

## 2014-02-04 MED ORDER — TETANUS-DIPHTH-ACELL PERTUSSIS 5-2.5-18.5 LF-MCG/0.5 IM SUSP
0.5000 mL | Freq: Once | INTRAMUSCULAR | Status: AC
  Administered 2014-02-04: 0.5 mL via INTRAMUSCULAR
  Filled 2014-02-04: qty 0.5

## 2014-02-04 MED ORDER — SODIUM CHLORIDE 0.9 % IJ SOLN: 3.0000 mL | INTRAMUSCULAR | Status: DC | PRN

## 2014-02-04 MED ORDER — DIPHENHYDRAMINE HCL 25 MG PO CAPS
25.0000 mg | ORAL_CAPSULE | Freq: Four times a day (QID) | ORAL | Status: DC | PRN
Start: 2014-02-04 — End: 2014-02-07

## 2014-02-04 MED ORDER — MENTHOL 3 MG MT LOZG: 1.0000 | LOZENGE | OROMUCOSAL | Status: DC | PRN

## 2014-02-04 MED ORDER — NALBUPHINE HCL 10 MG/ML IJ SOLN: 5.0000 mg | INTRAMUSCULAR | Status: DC | PRN

## 2014-02-04 MED ORDER — SODIUM CHLORIDE 0.9 % IV SOLN: Freq: Once | INTRAVENOUS | Status: DC

## 2014-02-04 MED ORDER — KETOROLAC TROMETHAMINE 30 MG/ML IJ SOLN
30.0000 mg | Freq: Four times a day (QID) | INTRAMUSCULAR | Status: DC | PRN
  Administered 2014-02-04: 30 mg via INTRAVENOUS

## 2014-02-04 MED ORDER — SIMETHICONE 80 MG PO CHEW
80.0000 mg | CHEWABLE_TABLET | ORAL | Status: DC
  Administered 2014-02-06: 80 mg via ORAL
  Filled 2014-02-04 (×2): qty 1

## 2014-02-04 MED ORDER — SCOPOLAMINE 1 MG/3DAYS TD PT72
1.0000 | MEDICATED_PATCH | Freq: Once | TRANSDERMAL | Status: AC
  Administered 2014-02-04: 1.5 mg via TRANSDERMAL

## 2014-02-04 MED ORDER — PRENATAL MULTIVITAMIN CH
1.0000 | ORAL_TABLET | Freq: Every day | ORAL | Status: DC
  Administered 2014-02-04 – 2014-02-06 (×3): 1 via ORAL
  Filled 2014-02-04 (×3): qty 1

## 2014-02-04 MED ORDER — SODIUM CHLORIDE 0.9 % IV SOLN
Freq: Once | INTRAVENOUS | Status: AC
  Administered 2014-02-04: 15:00:00 via INTRAVENOUS

## 2014-02-04 MED ORDER — SIMETHICONE 80 MG PO CHEW
80.0000 mg | CHEWABLE_TABLET | Freq: Three times a day (TID) | ORAL | Status: DC
  Administered 2014-02-04 – 2014-02-07 (×10): 80 mg via ORAL
  Filled 2014-02-04 (×10): qty 1

## 2014-02-04 MED ORDER — IBUPROFEN 600 MG PO TABS: 600.0000 mg | ORAL_TABLET | Freq: Four times a day (QID) | ORAL | Status: DC

## 2014-02-04 MED ORDER — SCOPOLAMINE 1 MG/3DAYS TD PT72
MEDICATED_PATCH | TRANSDERMAL | Status: AC
  Filled 2014-02-04: qty 1

## 2014-02-04 MED ORDER — OXYCODONE-ACETAMINOPHEN 5-325 MG PO TABS
1.0000 | ORAL_TABLET | ORAL | Status: DC | PRN
  Administered 2014-02-04 – 2014-02-05 (×3): 1 via ORAL
  Administered 2014-02-05: 2 via ORAL
  Administered 2014-02-05 – 2014-02-06 (×4): 1 via ORAL
  Administered 2014-02-06: 2 via ORAL
  Administered 2014-02-06 (×2): 1 via ORAL
  Filled 2014-02-04 (×2): qty 1
  Filled 2014-02-04: qty 2
  Filled 2014-02-04 (×8): qty 1
  Filled 2014-02-04: qty 2

## 2014-02-04 MED ORDER — NALOXONE HCL 1 MG/ML IJ SOLN
1.0000 ug/kg/h | INTRAMUSCULAR | Status: DC | PRN
  Filled 2014-02-04: qty 2

## 2014-02-04 MED ORDER — METOCLOPRAMIDE HCL 5 MG/ML IJ SOLN: 10.0000 mg | Freq: Three times a day (TID) | INTRAMUSCULAR | Status: DC | PRN

## 2014-02-04 MED ORDER — ZOLPIDEM TARTRATE 5 MG PO TABS: 5.0000 mg | ORAL_TABLET | Freq: Every evening | ORAL | Status: DC | PRN

## 2014-02-04 MED ORDER — NALOXONE HCL 0.4 MG/ML IJ SOLN: 0.4000 mg | INTRAMUSCULAR | Status: DC | PRN

## 2014-02-04 MED ORDER — KETOROLAC TROMETHAMINE 30 MG/ML IJ SOLN: 30.0000 mg | Freq: Four times a day (QID) | INTRAMUSCULAR | Status: DC | PRN

## 2014-02-04 MED ORDER — DIPHENHYDRAMINE HCL 50 MG/ML IJ SOLN: 12.5000 mg | INTRAMUSCULAR | Status: DC | PRN

## 2014-02-04 MED ORDER — SENNOSIDES-DOCUSATE SODIUM 8.6-50 MG PO TABS
2.0000 | ORAL_TABLET | ORAL | Status: DC
  Administered 2014-02-05 – 2014-02-06 (×2): 2 via ORAL
  Filled 2014-02-04 (×2): qty 2

## 2014-02-04 MED ORDER — ONDANSETRON HCL 4 MG/2ML IJ SOLN: 4.0000 mg | Freq: Three times a day (TID) | INTRAMUSCULAR | Status: DC | PRN

## 2014-02-04 MED ORDER — DIPHENHYDRAMINE HCL 25 MG PO CAPS: 25.0000 mg | ORAL_CAPSULE | ORAL | Status: DC | PRN

## 2014-02-04 MED ORDER — SODIUM CHLORIDE 0.9 % IV SOLN
Freq: Once | INTRAVENOUS | Status: AC
  Administered 2014-02-04: 03:00:00 via INTRAVENOUS

## 2014-02-04 MED ORDER — LANOLIN HYDROUS EX OINT: 1.0000 "application " | TOPICAL_OINTMENT | CUTANEOUS | Status: DC | PRN

## 2014-02-04 MED ORDER — OXYTOCIN 40 UNITS IN LACTATED RINGERS INFUSION - SIMPLE MED: 62.5000 mL/h | INTRAVENOUS | Status: AC

## 2014-02-04 MED ORDER — DIBUCAINE 1 % RE OINT: 1.0000 "application " | TOPICAL_OINTMENT | RECTAL | Status: DC | PRN

## 2014-02-04 MED ORDER — WITCH HAZEL-GLYCERIN EX PADS: 1.0000 "application " | MEDICATED_PAD | CUTANEOUS | Status: DC | PRN

## 2014-02-04 MED ORDER — ONDANSETRON HCL 4 MG/2ML IJ SOLN: 4.0000 mg | INTRAMUSCULAR | Status: DC | PRN

## 2014-02-04 MED ORDER — ONDANSETRON HCL 4 MG PO TABS: 4.0000 mg | ORAL_TABLET | ORAL | Status: DC | PRN

## 2014-02-04 MED ORDER — FENTANYL CITRATE 0.05 MG/ML IJ SOLN
INTRAMUSCULAR | Status: AC
  Filled 2014-02-04: qty 2

## 2014-02-04 MED ORDER — DIPHENHYDRAMINE HCL 50 MG/ML IJ SOLN: 25.0000 mg | INTRAMUSCULAR | Status: DC | PRN

## 2014-02-04 MED ORDER — SIMETHICONE 80 MG PO CHEW: 80.0000 mg | CHEWABLE_TABLET | ORAL | Status: DC | PRN

## 2014-02-04 MED ORDER — SODIUM CHLORIDE 0.9 % IV SOLN: INTRAVENOUS | Status: DC

## 2014-02-04 NOTE — Lactation Note (Signed)
This note was copied from the chart of Felicia Holt. Lactation Consultation Note  Initial visit done.  Breastfeeding consultation services and support information given to patient.  Mom states she desires to both breast and formula feed.  Mom just attempted to latch baby prior to visit without success so she fed baby bottle.  Mom is eating her breakfast.  Instructed to call for assist when baby starts cueing.  Patient Name: Felicia Holt ZOXWR'UToday's Date: 02/04/2014 Reason for consult: Initial assessment   Maternal Data Formula Feeding for Exclusion: Yes Reason for exclusion: Mother's choice to formula and breast feed on admission;Admission to Intensive Care Unit (ICU) post-partum Does the patient have breastfeeding experience prior to this delivery?: No  Feeding    LATCH Score/Interventions                      Lactation Tools Discussed/Used     Consult Status Consult Status: Follow-up Date: 02/05/14 Follow-up type: In-patient    Hansel Feinsteinowell, [name] Ann 02/04/2014, 9:17 AM

## 2014-02-04 NOTE — Anesthesia Postprocedure Evaluation (Signed)
  Anesthesia Post-op Note  Patient: Felicia ChristenJanai Holt  Procedure(s) Performed: Procedure(s): CESAREAN SECTION (N/A)  Patient Location: ICU  Anesthesia Type:Epidural  Level of Consciousness: awake, alert  and oriented  Airway and Oxygen Therapy: Patient Spontanous Breathing  Post-op Pain: none  Post-op Assessment: Post-op Vital signs reviewed, Patient's Cardiovascular Status Stable, Respiratory Function Stable, No headache, No backache, No residual numbness and No residual motor weakness  Post-op Vital Signs: Reviewed and stable  Last Vitals:  Filed Vitals:   02/04/14 0800  BP: 118/62  Pulse: 96  Temp:   Resp:     Complications: No apparent anesthesia complications

## 2014-02-04 NOTE — Progress Notes (Signed)
UR chart review completed.  

## 2014-02-04 NOTE — Progress Notes (Signed)
Patient ID: Felicia Holt, female   DOB: [birth year], 18 y.o.   MRN: [id number] Subjective: POD# 1 s/p Cesarean Delivery.  Indications: worsening preeclampsia with severe features  RH status/Rubella reviewed. Feeding: unknown Patient reports tolerating PO.  Denies HA/SOB/C/P/N/V/dizziness. Breast symptoms: No..  She reports vaginal bleeding as normal, without clots.  She is ambulating, urinating without difficulty.     Objective: Vital signs in last 24 hours: BP 118/62  Pulse 96  Temp(Src) 97.8 F (36.6 C) (Oral)  Resp 18  Ht 5\' 2"  (1.575 m)  Wt 127.551 kg (281 lb 3.2 oz)  BMI 51.42 kg/m2  SpO2 100%  Breastfeeding? Unknown    Intake/Output Summary (Last 24 hours) at 02/04/14 0836 Last data filed at 02/04/14 0800  Gross per 24 hour  Intake 7919.23 ml  Output   3210 ml  Net 4709.23 ml    Physical Exam:  General: alert CV: Regular rate and rhythm Resp: clear Abdomen: soft, nontender, normal bowel sounds Lochia: moderate Uterine Fundus: firm, below umbilicus, tender Incision: negative pressure dressing in place Ext: edema 4+    Recent Labs  02/03/14 2348 02/04/14 0255  HGB 9.2* 6.4*  HCT 28.0* 19.1*      Assessment/Plan: 18 y.o.  status post Cesarean section. POD# 1.   Stable.  Anemia secondary to intraoperative blood loss Preeclampsia/acute renal insufficiency--B/P stable, urine output improving              Serial labs Decrease IV fluid Continue MgSO4 Advance diet as tolerated Start po pain meds Ambulate IS Routine post-op care  Caldwell-MOORE,Tuere Nwosu A 02/04/2014, 8:36 AM

## 2014-02-04 NOTE — Lactation Note (Signed)
This note was copied from the chart of Felicia Holt. Lactation Consultation Note  Patient Name: Felicia Holt [id number]'XToday's Date: 02/04/2014 Reason for consult: Follow-up assessment;Difficult latch due to mom having flat nipples with inverted center.  DEBP set-up by RN, Idalia NeedlePaige and mom pumped for 15 minutes.  No ebm obtained yet but LC stressed need for regular pumping every 3 hours for 15 minutes and latch attempts per baby's hunger cues.  LC fitted mom for #24 NS and provided curved-tip syringes.  LC recommends inserting some ebm or formula into tip of NS after applying to breast as this will encourage baby to open mouth wide, latch and suck. LC stressed importance of regular pumping, hand expression and dscussed that small drops may be expressed initially but will increase over next few days.  Mom has just fed baby formula (10 ml's) and baby being held and burped, then family arrive.  LC reported plan to RN staff.    Maternal Data    Feeding    LATCH Score/Interventions            N/A - no latch at this visit          Lactation Tools Discussed/Used Tools: Nipple Shields Nipple shield size: 24 (LC fitted mom and demonstrated how to apply) Initiated by:: RN, Paige Date initiated:: 02/04/14  DEBP, cue feedings, use and cleaning of NS and curved-tip syringe  Consult Status Consult Status: Follow-up Date: 02/05/14 Follow-up type: In-patient    Felicia Holt, Felicia Holt 02/04/2014, 7:18 PM

## 2014-02-05 ENCOUNTER — Encounter (HOSPITAL_COMMUNITY): Payer: Self-pay | Admitting: Obstetrics & Gynecology

## 2014-02-05 DIAGNOSIS — O1424 HELLP syndrome, complicating childbirth: Secondary | ICD-10-CM | POA: Diagnosis present

## 2014-02-05 LAB — COMPREHENSIVE METABOLIC PANEL
ALBUMIN: 1.4 g/dL — AB (ref 3.5–5.2)
ALK PHOS: 77 U/L (ref 39–117)
ALT: 13 U/L (ref 0–35)
AST: 14 U/L (ref 0–37)
Anion gap: 11 (ref 5–15)
BUN: 18 mg/dL (ref 6–23)
CHLORIDE: 109 meq/L (ref 96–112)
CO2: 20 meq/L (ref 19–32)
Calcium: 7.6 mg/dL — ABNORMAL LOW (ref 8.4–10.5)
Creatinine, Ser: 1.45 mg/dL — ABNORMAL HIGH (ref 0.50–1.10)
GFR calc Af Amer: 60 mL/min — ABNORMAL LOW (ref >90)
GFR, EST NON AFRICAN AMERICAN: 51 mL/min — AB (ref >90)
Glucose, Bld: 132 mg/dL — ABNORMAL HIGH (ref 70–99)
POTASSIUM: 3.9 meq/L (ref 3.7–5.3)
Sodium: 140 mEq/L (ref 137–147)
Total Protein: 4.7 g/dL — ABNORMAL LOW (ref 6.0–8.3)

## 2014-02-05 LAB — CBC
HCT: 22.2 % — ABNORMAL LOW (ref 36.0–46.0)
Hemoglobin: 7.6 g/dL — ABNORMAL LOW (ref 12.0–15.0)
MCH: 27 pg (ref 26.0–34.0)
MCHC: 34.2 g/dL (ref 30.0–36.0)
MCV: 78.7 fL (ref 78.0–100.0)
PLATELETS: 163 10*3/uL (ref 150–400)
RBC: 2.82 MIL/uL — AB (ref 3.87–5.11)
RDW: 14.9 % (ref 11.5–15.5)
WBC: 16.2 10*3/uL — AB (ref 4.0–10.5)

## 2014-02-05 LAB — TYPE AND SCREEN
ABO/RH(D): AB POS
Antibody Screen: NEGATIVE
UNIT DIVISION: 0
UNIT DIVISION: 0
Unit division: 0
Unit division: 0

## 2014-02-05 LAB — GLUCOSE, CAPILLARY
GLUCOSE-CAPILLARY: 68 mg/dL — AB (ref 70–99)
GLUCOSE-CAPILLARY: 74 mg/dL (ref 70–99)
GLUCOSE-CAPILLARY: 93 mg/dL (ref 70–99)
Glucose-Capillary: 104 mg/dL — ABNORMAL HIGH (ref 70–99)

## 2014-02-05 MED ORDER — SODIUM CHLORIDE 0.9 % IJ SOLN: 3.0000 mL | INTRAMUSCULAR | Status: DC | PRN

## 2014-02-05 MED ORDER — MEASLES, MUMPS & RUBELLA VAC ~~LOC~~ INJ: 0.5000 mL | INJECTION | Freq: Once | SUBCUTANEOUS | Status: DC

## 2014-02-05 MED ORDER — RHO D IMMUNE GLOBULIN 1500 UNIT/2ML IJ SOSY
300.0000 ug | PREFILLED_SYRINGE | Freq: Once | INTRAMUSCULAR | Status: DC
  Filled 2014-02-05: qty 2

## 2014-02-05 MED ORDER — SODIUM CHLORIDE 0.9 % IJ SOLN
3.0000 mL | Freq: Two times a day (BID) | INTRAMUSCULAR | Status: DC
  Administered 2014-02-05: 3 mL via INTRAVENOUS

## 2014-02-05 NOTE — Progress Notes (Signed)
[id number]

## 2014-02-05 NOTE — Progress Notes (Signed)
Pt CBG was 68 at 1930. She had 45 gram carb meal at bedside. Pt instructed to eat meal. Rechecked CBG in 20 min. CBG at recheck was 74. Pt instructed to notifiy RN if lightheaded or dizzy.

## 2014-02-05 NOTE — Progress Notes (Signed)
Patient ID: Felicia Holt, female   DOB: 05/01/1996, 18 y.o.   MRN: [id number] Subjective: POD# 2 s/p Cesarean Delivery.  Indications: worsening preeclampsia with severe features  RH status/Rubella reviewed. Feeding: unknown Patient reports tolerating PO.  Denies HA/SOB/C/P/N/V/dizziness. Breast symptoms: No..  She reports vaginal bleeding as normal, without clots.   Objective: Vital signs in last 24 hours: BP 118/62  Pulse 96  Temp(Src) 97.8 F (36.6 C) (Oral)  Resp 18  Ht 5\' 2"  (1.575 m)  Wt 127.551 kg (281 lb 3.2 oz)  BMI 51.42 kg/m2  SpO2 100%  Breastfeeding? Unknown    Intake/Output Summary (Last 24 hours) at 02/05/14 1030 Last data filed at 02/05/14 0945  Gross per 24 hour  Intake 3287.92 ml  Output   6590 ml  Net -3302.08 ml    Physical Exam:  General: alert CV: Regular rate and rhythm Resp: clear Abdomen: soft, nontender, normal bowel sounds Lochia: moderate Uterine Fundus: firm, below umbilicus, tender Incision: negative pressure dressing in place Ext: edema 4+    Results for orders placed during the hospital encounter of 02/02/14 (from the past 24 hour(s))  MAGNESIUM     Status: Abnormal   Collection Time    02/04/14  1:00 PM      Result Value Ref Range   Magnesium 4.3 (*) 1.5 - 2.5 mg/dL  CBC     Status: Abnormal   Collection Time    02/04/14  1:15 PM      Result Value Ref Range   WBC 16.9 (*) 4.0 - 10.5 K/uL   RBC 2.57 (*) 3.87 - 5.11 MIL/uL   Hemoglobin 7.0 (*) 12.0 - 15.0 g/dL   HCT 08.6 (*) 57.8 - 46.9 %   MCV 79.0  78.0 - 100.0 fL   MCH 27.2  26.0 - 34.0 pg   MCHC 34.5  30.0 - 36.0 g/dL   RDW 62.9  52.8 - 41.3 %   Platelets 128 (*) 150 - 400 K/uL  COMPREHENSIVE METABOLIC PANEL     Status: Abnormal   Collection Time    02/04/14  1:15 PM      Result Value Ref Range   Sodium 139  137 - 147 mEq/L   Potassium 4.5  3.7 - 5.3 mEq/L   Chloride 107  96 - 112 mEq/L   CO2 20  19 - 32 mEq/L   Glucose, Bld 141 (*) 70 - 99 mg/dL   BUN 22  6 - 23  mg/dL   Creatinine, Ser 2.44 (*) 0.50 - 1.10 mg/dL   Calcium 7.2 (*) 8.4 - 10.5 mg/dL   Total Protein 4.3 (*) 6.0 - 8.3 g/dL   Albumin 1.3 (*) 3.5 - 5.2 g/dL   AST 21  0 - 37 U/L   ALT 15  0 - 35 U/L   Alkaline Phosphatase 81  39 - 117 U/L   Total Bilirubin 0.3  0.3 - 1.2 mg/dL   GFR calc non Af Amer 32 (*) >90 mL/min   GFR calc Af Amer 38 (*) >90 mL/min   Anion gap 12  5 - 15  LACTATE DEHYDROGENASE     Status: Abnormal   Collection Time    02/04/14  1:15 PM      Result Value Ref Range   LDH 427 (*) 94 - 250 U/L  PREPARE RBC (CROSSMATCH)     Status: None   Collection Time    02/04/14  3:00 PM      Result Value Ref Range  Order Confirmation ORDER PROCESSED BY BLOOD BANK    CBC     Status: Abnormal   Collection Time    02/04/14  8:20 PM      Result Value Ref Range   WBC 16.2 (*) 4.0 - 10.5 K/uL   RBC 2.82 (*) 3.87 - 5.11 MIL/uL   Hemoglobin 7.6 (*) 12.0 - 15.0 g/dL   HCT 16.122.3 (*) 09.636.0 - 04.546.0 %   MCV 79.1  78.0 - 100.0 fL   MCH 27.0  26.0 - 34.0 pg   MCHC 34.1  30.0 - 36.0 g/dL   RDW 40.914.6  81.111.5 - 91.415.5 %   Platelets 148 (*) 150 - 400 K/uL  COMPREHENSIVE METABOLIC PANEL     Status: Abnormal   Collection Time    02/04/14  8:20 PM      Result Value Ref Range   Sodium 137  137 - 147 mEq/L   Potassium 4.2  3.7 - 5.3 mEq/L   Chloride 106  96 - 112 mEq/L   CO2 20  19 - 32 mEq/L   Glucose, Bld 135 (*) 70 - 99 mg/dL   BUN 20  6 - 23 mg/dL   Creatinine, Ser 7.821.81 (*) 0.50 - 1.10 mg/dL   Calcium 7.3 (*) 8.4 - 10.5 mg/dL   Total Protein 4.5 (*) 6.0 - 8.3 g/dL   Albumin 1.4 (*) 3.5 - 5.2 g/dL   AST 28  0 - 37 U/L   ALT 14  0 - 35 U/L   Alkaline Phosphatase 83  39 - 117 U/L   Total Bilirubin <0.2 (*) 0.3 - 1.2 mg/dL   GFR calc non Af Amer 39 (*) >90 mL/min   GFR calc Af Amer 46 (*) >90 mL/min   Anion gap 11  5 - 15  PROTIME-INR     Status: None   Collection Time    02/04/14  8:20 PM      Result Value Ref Range   Prothrombin Time 14.0  11.6 - 15.2 seconds   INR 1.08  0.00 -  1.49  APTT     Status: None   Collection Time    02/04/14  8:20 PM      Result Value Ref Range   aPTT 30  24 - 37 seconds  FIBRINOGEN     Status: None   Collection Time    02/04/14  8:20 PM      Result Value Ref Range   Fibrinogen 354  204 - 475 mg/dL  SAVE SMEAR     Status: None   Collection Time    02/04/14  8:20 PM      Result Value Ref Range   Smear Review SMEAR STAINED AND AVAILABLE FOR REVIEW    D-DIMER, QUANTITATIVE     Status: Abnormal   Collection Time    02/04/14  8:20 PM      Result Value Ref Range   D-Dimer, Quant 19.71 (*) 0.00 - 0.48 ug/mL-FEU  CBC     Status: Abnormal   Collection Time    02/05/14  5:33 AM      Result Value Ref Range   WBC 16.2 (*) 4.0 - 10.5 K/uL   RBC 2.82 (*) 3.87 - 5.11 MIL/uL   Hemoglobin 7.6 (*) 12.0 - 15.0 g/dL   HCT 95.622.2 (*) 21.336.0 - 08.646.0 %   MCV 78.7  78.0 - 100.0 fL   MCH 27.0  26.0 - 34.0 pg   MCHC 34.2  30.0 - 36.0  g/dL   RDW 16.1  09.6 - 04.5 %   Platelets 163  150 - 400 K/uL  COMPREHENSIVE METABOLIC PANEL     Status: Abnormal   Collection Time    02/05/14  5:33 AM      Result Value Ref Range   Sodium 140  137 - 147 mEq/L   Potassium 3.9  3.7 - 5.3 mEq/L   Chloride 109  96 - 112 mEq/L   CO2 20  19 - 32 mEq/L   Glucose, Bld 132 (*) 70 - 99 mg/dL   BUN 18  6 - 23 mg/dL   Creatinine, Ser 4.09 (*) 0.50 - 1.10 mg/dL   Calcium 7.6 (*) 8.4 - 10.5 mg/dL   Total Protein 4.7 (*) 6.0 - 8.3 g/dL   Albumin 1.4 (*) 3.5 - 5.2 g/dL   AST 14  0 - 37 U/L   ALT 13  0 - 35 U/L   Alkaline Phosphatase 77  39 - 117 U/L   Total Bilirubin <0.2 (*) 0.3 - 1.2 mg/dL   GFR calc non Af Amer 51 (*) >90 mL/min   GFR calc Af Amer 60 (*) >90 mL/min   Anion gap 11  5 - 15     Assessment/Plan: 18 y.o.  status post Cesarean section. POD# 1.     Anemia secondary to intraoperative blood loss/Partial HELLP Preeclampsia/acute renal insufficiency--B/P stable, urine output improving, renal function returning             OK for  transfer-->floor Discontinue MgSO4 Ambulate IS Routine post-op care  Brum-MOORE,Edda Orea A 02/05/2014, 10:30 AM

## 2014-02-05 NOTE — Anesthesia Postprocedure Evaluation (Signed)
  Anesthesia Post-op Note  Patient: Felicia ChristenJanai Holt  Procedure(s) Performed: Procedure(s): CESAREAN SECTION (N/A)  Patient Location: PACU  Anesthesia Type:Epidural  Level of Consciousness: awake  Airway and Oxygen Therapy: Patient Spontanous Breathing  Post-op Pain: moderate  Post-op Assessment: Post-op Vital signs reviewed, Patient's Cardiovascular Status Stable, Respiratory Function Stable, Patent Airway and Pain level controlled  Post-op Vital Signs: Reviewed and stable  Last Vitals:  Filed Vitals:   02/05/14 0300  BP: 118/71  Pulse: 113  Temp:   Resp: 20    Complications: No apparent anesthesia complications

## 2014-02-06 LAB — COMPREHENSIVE METABOLIC PANEL
ALBUMIN: 1.5 g/dL — AB (ref 3.5–5.2)
ALK PHOS: 86 U/L (ref 39–117)
ALT: 52 U/L — ABNORMAL HIGH (ref 0–35)
ANION GAP: 11 (ref 5–15)
AST: 80 U/L — ABNORMAL HIGH (ref 0–37)
BILIRUBIN TOTAL: 0.2 mg/dL — AB (ref 0.3–1.2)
BUN: 12 mg/dL (ref 6–23)
CO2: 21 mEq/L (ref 19–32)
CREATININE: 1.05 mg/dL (ref 0.50–1.10)
Calcium: 7.6 mg/dL — ABNORMAL LOW (ref 8.4–10.5)
Chloride: 107 mEq/L (ref 96–112)
GFR calc non Af Amer: 76 mL/min — ABNORMAL LOW (ref >90)
GFR, EST AFRICAN AMERICAN: 88 mL/min — AB (ref >90)
GLUCOSE: 85 mg/dL (ref 70–99)
Potassium: 3.6 mEq/L — ABNORMAL LOW (ref 3.7–5.3)
Sodium: 139 mEq/L (ref 137–147)
TOTAL PROTEIN: 4.7 g/dL — AB (ref 6.0–8.3)

## 2014-02-06 LAB — GLUCOSE, CAPILLARY: Glucose-Capillary: 80 mg/dL (ref 70–99)

## 2014-02-06 LAB — CBC
HEMATOCRIT: 21.9 % — AB (ref 36.0–46.0)
HEMOGLOBIN: 7.5 g/dL — AB (ref 12.0–15.0)
MCH: 27.2 pg (ref 26.0–34.0)
MCHC: 34.2 g/dL (ref 30.0–36.0)
MCV: 79.3 fL (ref 78.0–100.0)
Platelets: 181 10*3/uL (ref 150–400)
RBC: 2.76 MIL/uL — ABNORMAL LOW (ref 3.87–5.11)
RDW: 15.1 % (ref 11.5–15.5)
WBC: 14.1 10*3/uL — ABNORMAL HIGH (ref 4.0–10.5)

## 2014-02-06 MED ORDER — MEASLES, MUMPS & RUBELLA VAC ~~LOC~~ INJ
0.5000 mL | INJECTION | Freq: Once | SUBCUTANEOUS | Status: AC
  Administered 2014-02-07: 0.5 mL via SUBCUTANEOUS
  Filled 2014-02-06: qty 0.5

## 2014-02-06 NOTE — Progress Notes (Signed)
Patient ID: Felicia Holt, female   DOB: 1996-01-13, 18 y.o.   MRN: [id number] Subjective: POD# 3 s/p Cesarean Delivery.  Indications: worsening preeclampsia with severe features  RH status/Rubella reviewed. Feeding: unknown Patient reports tolerating PO.  Denies HA/SOB/C/P/N/V/dizziness. Breast symptoms: No..  She reports vaginal bleeding as normal, without clots.   Objective: Vital signs in last 24 hours: BP 118/62  Pulse 96  Temp(Src) 97.8 F (36.6 C) (Oral)  Resp 18  Ht 5\' 2"  (1.575 m)  Wt 127.551 kg (281 lb 3.2 oz)  BMI 51.42 kg/m2  SpO2 100%  Breastfeeding? Unknown    Intake/Output Summary (Last 24 hours) at 02/06/14 1121 Last data filed at 02/06/14 0400  Gross per 24 hour  Intake   1800 ml  Output   2400 ml  Net   -600 ml    Physical Exam:  General: alert CV: Regular rate and rhythm Resp: clear Abdomen: soft, nontender, normal bowel sounds Lochia: moderate Uterine Fundus: firm, below umbilicus, tender Incision: negative pressure dressing in place Ext: edema 4+    Results for orders placed during the hospital encounter of 02/02/14 (from the past 24 hour(s))  GLUCOSE, CAPILLARY     Status: Abnormal   Collection Time    02/05/14 12:11 PM      Result Value Ref Range   Glucose-Capillary 104 (*) 70 - 99 mg/dL  GLUCOSE, CAPILLARY     Status: Abnormal   Collection Time    02/05/14  7:25 PM      Result Value Ref Range   Glucose-Capillary 68 (*) 70 - 99 mg/dL  GLUCOSE, CAPILLARY     Status: None   Collection Time    02/05/14  7:49 PM      Result Value Ref Range   Glucose-Capillary 74  70 - 99 mg/dL  GLUCOSE, CAPILLARY     Status: None   Collection Time    02/05/14 10:45 PM      Result Value Ref Range   Glucose-Capillary 93  70 - 99 mg/dL  CBC     Status: Abnormal   Collection Time    02/06/14  6:00 AM      Result Value Ref Range   WBC 14.1 (*) 4.0 - 10.5 K/uL   RBC 2.76 (*) 3.87 - 5.11 MIL/uL   Hemoglobin 7.5 (*) 12.0 - 15.0 g/dL   HCT 81.1 (*) 91.4 -  46.0 %   MCV 79.3  78.0 - 100.0 fL   MCH 27.2  26.0 - 34.0 pg   MCHC 34.2  30.0 - 36.0 g/dL   RDW 78.2  95.6 - 21.3 %   Platelets 181  150 - 400 K/uL  COMPREHENSIVE METABOLIC PANEL     Status: Abnormal   Collection Time    02/06/14  6:00 AM      Result Value Ref Range   Sodium 139  137 - 147 mEq/L   Potassium 3.6 (*) 3.7 - 5.3 mEq/L   Chloride 107  96 - 112 mEq/L   CO2 21  19 - 32 mEq/L   Glucose, Bld 85  70 - 99 mg/dL   BUN 12  6 - 23 mg/dL   Creatinine, Ser 0.86  0.50 - 1.10 mg/dL   Calcium 7.6 (*) 8.4 - 10.5 mg/dL   Total Protein 4.7 (*) 6.0 - 8.3 g/dL   Albumin 1.5 (*) 3.5 - 5.2 g/dL   AST 80 (*) 0 - 37 U/L   ALT 52 (*) 0 - 35 U/L  Alkaline Phosphatase 86  39 - 117 U/L   Total Bilirubin 0.2 (*) 0.3 - 1.2 mg/dL   GFR calc non Af Amer 76 (*) >90 mL/min   GFR calc Af Amer 88 (*) >90 mL/min   Anion gap 11  5 - 15  GLUCOSE, CAPILLARY     Status: None   Collection Time    02/06/14  8:17 AM      Result Value Ref Range   Glucose-Capillary 80  70 - 99 mg/dL     Assessment/Plan: 18 y.o.  status post Cesarean section. POD# 3.     Anemia secondary to intraoperative blood loss/Partial HELLP--stable Preeclampsia/acute renal insufficiency--B/P stable, renal function returning  Tolerating po CBGs in range--h/o GDM, insulin requiring            BMET in am Ambulate IS Routine post-op care  Rajagopalan-MOORE,Lorn Butcher A 02/06/2014, 11:21 AM

## 2014-02-07 LAB — BASIC METABOLIC PANEL
Anion gap: 11 (ref 5–15)
BUN: 8 mg/dL (ref 6–23)
CHLORIDE: 105 meq/L (ref 96–112)
CO2: 22 mEq/L (ref 19–32)
Calcium: 7.8 mg/dL — ABNORMAL LOW (ref 8.4–10.5)
Creatinine, Ser: 0.9 mg/dL (ref 0.50–1.10)
Glucose, Bld: 82 mg/dL (ref 70–99)
Potassium: 3.8 mEq/L (ref 3.7–5.3)
Sodium: 138 mEq/L (ref 137–147)

## 2014-02-07 MED ORDER — OXYCODONE-ACETAMINOPHEN 5-325 MG PO TABS: 1.0000 | ORAL_TABLET | ORAL | Status: DC | PRN

## 2014-02-07 MED ORDER — HYDROCHLOROTHIAZIDE 12.5 MG PO CAPS: 12.5000 mg | ORAL_CAPSULE | Freq: Every day | ORAL | Status: DC

## 2014-02-07 MED ORDER — HYDROCHLOROTHIAZIDE 12.5 MG PO CAPS
12.5000 mg | ORAL_CAPSULE | Freq: Every day | ORAL | Status: DC
  Administered 2014-02-07: 12.5 mg via ORAL
  Filled 2014-02-07: qty 1

## 2014-02-07 NOTE — Discharge Summary (Signed)
  Obstetric Discharge Summary Reason for Admission: induction of labor Prenatal Procedures: none Intrapartum Procedures: cesarean: low cervical, transverse Postpartum Procedures: none Complications-Operative and Postpartum: hemorrhage and see discharge diagnoses  Hemoglobin  Date Value Ref Range Status  02/06/2014 7.5* 12.0 - 15.0 g/dL Final     HCT  Date Value Ref Range Status  02/06/2014 21.9* 36.0 - 46.0 % Final    Physical Exam:  General: alert Lochia: appropriate Uterine: firm Incision: negative pressure dressing in place DVT Evaluation: No evidence of DVT seen on physical exam. Calf/Ankle edema is present.  Discharge Diagnoses: Active Problems:   Preeclampsia, severe   Postoperative anemia due to acute blood loss   HELLP syndrome, delivered, current hospitalization   Cesarean delivery delivered   Discharge Information: Date: 02/07/2014 Activity: pelvic rest Diet: routine Medications:  Prior to Admission medications   Medication Sig Start Date End Date Taking? Authorizing Provider  ACCU-CHEK SOFTCLIX LANCETS lancets Use as instructed 12/28/13  Yes Shelly Bombard, MD  acetaminophen (TYLENOL) 325 MG tablet Take 325 mg by mouth every 6 (six) hours as needed for mild pain or moderate pain.   Yes Historical Provider, MD  Blood Glucose Monitoring Suppl (ACCU-CHEK AVIVA PLUS) W/DEVICE KIT 1 Device by Does not apply route once. 12/14/13  Yes Lahoma Crocker, MD  cyclobenzaprine (FLEXERIL) 10 MG tablet Take 1 tablet (10 mg total) by mouth 2 (two) times daily as needed for muscle spasms. 01/25/14  Yes Olegario Messier, NP  glucose blood test strip Use as instructed 12/28/13  Yes Shelly Bombard, MD  insulin aspart (NOVOLOG) 100 UNIT/ML injection Inject 12 Units into the skin 3 (three) times daily with meals. 12/28/13  Yes Shelly Bombard, MD  insulin aspart (NOVOLOG) 100 UNIT/ML injection Inject 0-32 Units into the skin 3 (three) times daily. Patient is on a sliding scale.  12/28/13  Yes Shelly Bombard, MD  insulin NPH Human (HUMULIN N,NOVOLIN N) 100 UNIT/ML injection Inject 0.2 mLs (20 Units total) into the skin daily before breakfast. 12/28/13  Yes Shelly Bombard, MD  omeprazole (PRILOSEC) 40 MG capsule Take 40 mg by mouth daily.   Yes Historical Provider, MD  Prenatal Vit-Fe Fumarate-FA (PRENATAL MULTIVITAMIN) TABS tablet Take 1 tablet by mouth daily at 12 noon.   Yes Historical Provider, MD  hydrochlorothiazide (MICROZIDE) 12.5 MG capsule Take 1 capsule (12.5 mg total) by mouth daily. 02/07/14   Lahoma Crocker, MD  insulin NPH Human (HUMULIN N,NOVOLIN N) 100 UNIT/ML injection Inject 0.25 mLs (25 Units total) into the skin at bedtime. 12/28/13   Shelly Bombard, MD  oxyCODONE-acetaminophen (PERCOCET/ROXICET) 5-325 MG per tablet Take 1-2 tablets by mouth every 4 (four) hours as needed for severe pain (moderate - severe pain). 02/07/14   Lahoma Crocker, MD    Condition: stable Instructions: refer to routine discharge instructions Discharge to: home Follow-up Information   Follow up with HARPER,CHARLES A, MD. Schedule an appointment as soon as possible for a visit on 02/09/2014.   Specialty:  Obstetrics and Gynecology   Contact information:   [address] [place] [address] [phone]       Newborn Data:  Live born female  Birth Weight: 8 lb 15 oz (4054 g) APGAR: 8, 9   Home with mother.  Swim-MOORE,Sandrea Boer A 02/07/2014, 10:26 AM

## 2014-02-07 NOTE — Discharge Instructions (Signed)
Cesarean Section (Postpartum Care) °These discharge instructions provide you with general information on cesarean section (caesarean, cesarian, caesarian) and caring for yourself after you leave the hospital. Your caregiver may also give you specific instructions. °Please read these instructions and refer to them in the next few weeks. If you have any questions regarding these instructions, you may call the nursing unit. If you have any problems after discharge, please call your doctor. If you are unable to reach your doctor, you should seek help at the nearest Emergency Department. °ACTIVITY °· Rest as much as possible the first two weeks at home.  °· When possible, have someone help you with your household activities and the new baby for 2 to 3 weeks.  °· Limit your housework and social activity. Increase your activity gradually as your strength returns.  °· Do not climb stairs more than two or three times a day.  °· Do not lift anything heavier than your baby.  °· Follow your doctor's instructions about driving a car.  °· Limit wearing support panties or control-top hose, since relying on your own muscles helps strengthen them.  °· Ask your doctor about exercises.  °NUTRITION °· You may return to your usual diet.  °· Drink 6 to 8 glasses of fluid a day.  °· Eat a well-balanced diet. Include portions of food from the meat/protein, milk, fruit, vegetable and bread groups.  °· Keep taking your prenatal or multivitamins.  °ELIMINATION °You should return to your usual bowel function. If constipation is a problem, you may take a mild laxative such as Milk of MagnesiaÔ with your caregiver’s permission. Gradually add fruit, vegetables and bran to your diet. Make sure to increase your fluids. °HYGIENE °You may shower, wash your hair and take tub baths unless your doctor tells you otherwise. Continue peri-care until your vaginal bleeding and discharge stops. Do not douche or use tampons until your caregiver says it is  OK. °FEVER °If you feel feverish or have shaking chills, take your temperature. If your temperature is 101° F (38.3° C), or is 100.4° F (38° C) two times in a four hour period, call your doctor. The fever may indicate infection. If you call early, infection can be treated with medicines that kill germs (antibiotics). Hospitalization may be avoided. °PAIN CONTROL °You may still have mild discomfort. Only take over-the-counter or prescription medicine as directed by your caregiver. Do not take aspirin; it can cause bleeding. If the pain is not relieved by your medicine or becomes worse, call your caregiver. °INCISION CARE °Clean your cut (incision) gently with soap and water. If your caregiver says it is okay, leave the incision without a dressing unless it is draining or irritated. If you have small adhesive strips across the incision and they do not fall off within 7 days, carefully peel them off. Check the incision daily for increased redness, drainage, swelling or separation of skin. Call your caregiver if any of these happen. °VAGINAL CARE °You may have a vaginal discharge or bleeding for up to 6 weeks. If the vaginal discharge becomes bright red, bad smelling, heavy in amount, has blood clots or if you have burning or frequency when urinating, call your caregiver. °SEXUAL INTERCOURSE °It is best to follow your caregiver's advice about when you may safely resume sexual intercourse. Most women can begin to have intercourse two to three weeks after their baby's birth. °You can become pregnant before you have a period. If you decide to have sexual intercourse, you should use   birth control if you do not want to become pregnant right away.  BREAST CARE If you are not breastfeeding and your breasts become tender, hard or leak milk, you may wear a firm fitting bra and apply ice to the breasts. If you are breastfeeding, wear a good support bra. Call your caregiver if you have breast pain, flu-like symptoms, fever or  hardness and reddening of your breasts. POSTPARTUM BLUES After the excitement of having the baby goes away, you may commonly have a period of low spirits or blues." Discuss your feelings with your partner, family and friends. This may be caused by the changing hormone levels in your body. You may want to contact your caregiver if this is worrisome. SEEK MEDICAL CARE IF:  There is swelling, redness or increasing pain in the wound area.   Pus is coming from the wound.   You notice a bad smell from the wound or surgical dressing.   You have pain, redness and swelling from the intravenous site.   The wound is breaking open (the edges are not staying together).   You feel dizzy or feel like fainting.   You develop pain or bleeding when you urinate.   You develop diarrhea.   You develop nausea and vomiting.   You develop abnormal vaginal discharge.   You develop a rash.   You have any type of abnormal reaction or develop an allergy to your medication.   You need stronger pain medication for your pain.  SEEK IMMEDIATE MEDICAL CARE:  You develop a temperature of 101 or higher.   You develop abdominal pain.   You develop chest pain.   You develop shortness of breath.   You pass out.   You develop pain, swelling or redness of your leg.   You develop heavy vaginal bleeding with or without blood clots.  Document Released: 03/10/2002 Document Re-Released: 12/06/2009 [facility] Patient Information 2011 [place], [place]. Hypertension During Pregnancy Hypertension, or high blood pressure, is when there is extra pressure inside your blood vessels that carry blood from the heart to the rest of your body (arteries). It can happen at any time in life, including pregnancy. Hypertension during pregnancy can cause problems for you and your baby. Your baby might not weigh as much as he or she should at birth or might be born early (premature). Very bad cases of hypertension during pregnancy can  be life-threatening.  Different types of hypertension can occur during pregnancy. These include:  Chronic hypertension. This happens when a woman has hypertension before pregnancy and it continues during pregnancy.  Gestational hypertension. This is when hypertension develops during pregnancy.  Preeclampsia or toxemia of pregnancy. This is a very serious type of hypertension that develops only during pregnancy. It affects the whole body and can be very dangerous for both mother and baby.  Gestational hypertension and preeclampsia usually go away after your baby is born. Your blood pressure will likely stabilize within 6 weeks. Women who have hypertension during pregnancy have a greater chance of developing hypertension later in life or with future pregnancies. RISK FACTORS There are certain factors that make it more likely for you to develop hypertension during pregnancy. These include:  Having hypertension before pregnancy.  Having hypertension during a previous pregnancy.  Being overweight.  Being older than 40 years.  Being pregnant with more than one baby.  Having diabetes or kidney problems. SIGNS AND SYMPTOMS Chronic and gestational hypertension rarely cause symptoms. Preeclampsia has symptoms, which may include:  Increased  protein in your urine. Your health care provider will check for this at every prenatal visit.  Swelling of your hands and face.  Rapid weight gain.  Headaches.  Visual changes.  Being bothered by light.  Abdominal pain, especially in the upper right area.  Chest pain.  Shortness of breath.  Increased reflexes.  Seizures. These occur with a more severe form of preeclampsia, called eclampsia. DIAGNOSIS  You may be diagnosed with hypertension during a regular prenatal exam. At each prenatal visit, you may have:  Your blood pressure checked.  A urine test to check for protein in your urine. The type of hypertension you are diagnosed with  depends on when you developed it. It also depends on your specific blood pressure reading.  Developing hypertension before 20 weeks of pregnancy is consistent with chronic hypertension.  Developing hypertension after 20 weeks of pregnancy is consistent with gestational hypertension.  Hypertension with increased urinary protein is diagnosed as preeclampsia.  Blood pressure measurements that stay above 160 systolic or 110 diastolic are a sign of severe preeclampsia. TREATMENT Treatment for hypertension during pregnancy varies. Treatment depends on the type of hypertension and how serious it is.  If you take medicine for chronic hypertension, you may need to switch medicines.  Medicines called ACE inhibitors should not be taken during pregnancy.  Low-dose aspirin may be suggested for women who have risk factors for preeclampsia.  If you have gestational hypertension, you may need to take a blood pressure medicine that is safe during pregnancy. Your health care provider will recommend the correct medicine.  If you have severe preeclampsia, you may need to be in the hospital. Health care providers will watch you and your baby very closely. You also may need to take medicine called magnesium sulfate to prevent seizures and lower blood pressure.  Sometimes, an early delivery is needed. This may be the case if the condition worsens. It would be done to protect you and your baby. The only cure for preeclampsia is delivery.  Your health care provider may recommend that you take one low-dose aspirin (81 mg) each day to help prevent high blood pressure during your pregnancy if you are at risk for preeclampsia. You may be at risk for preeclampsia if:  You had preeclampsia or eclampsia during a previous pregnancy.  Your baby did not grow as expected during a previous pregnancy.  You experienced preterm birth with a previous pregnancy.  You experienced a separation of the placenta from the uterus  (placental abruption) during a previous pregnancy.  You experienced the loss of your baby during a previous pregnancy.  You are pregnant with more than one baby.  You have other medical conditions, such as diabetes or an autoimmune disease. HOME CARE INSTRUCTIONS  Schedule and keep all of your regular prenatal care appointments. This is important.  Take medicines only as directed by your health care provider. Tell your health care provider about all medicines you take.  Eat as little salt as possible.  Get regular exercise.  Do not drink alcohol.  Do not use tobacco products.  Do not drink products with caffeine.  Lie on your left side when resting. SEEK IMMEDIATE MEDICAL CARE IF:  You have severe abdominal pain.  You have sudden swelling in your hands, ankles, or face.  You gain 4 pounds (1.8 kg) or more in 1 week.  You vomit repeatedly.  You have vaginal bleeding.  You do not feel your baby moving as much.  You have  a headache.  You have blurred or double vision.  You have muscle twitching or spasms.  You have shortness of breath.  You have blue fingernails or lips.  You have blood in your urine. MAKE SURE YOU:  Understand these instructions.  Will watch your condition.  Will get help right away if you are not doing well or get worse. Document Released: 03/06/2011 Document Revised: 11/02/2013 Document Reviewed: 01/15/2013 [facility] Patient Information 2015 [place], [place]. This information is not intended to replace advice given to you by your health care provider. Make sure you discuss any questions you have with your health care provider.

## 2014-02-09 ENCOUNTER — Ambulatory Visit: Payer: Medicaid Other | Admitting: Obstetrics

## 2014-02-11 ENCOUNTER — Encounter: Payer: Self-pay | Admitting: Obstetrics

## 2014-02-11 ENCOUNTER — Ambulatory Visit (INDEPENDENT_AMBULATORY_CARE_PROVIDER_SITE_OTHER): Payer: Medicaid Other | Admitting: Obstetrics

## 2014-02-12 ENCOUNTER — Encounter: Payer: Self-pay | Admitting: Obstetrics

## 2014-02-12 NOTE — Progress Notes (Signed)
Subjective:     Felicia ChristenJanai Holt is a 18 y.o. female who presents for a postpartum visit. She is 1 week postpartum following a low cervical transverse Cesarean section. I have fully reviewed the prenatal and intrapartum course. The delivery was at 37 gestational weeks. Outcome: primary cesarean section, low transverse incision. Anesthesia: spinal. Postpartum course has been normal. Baby's course has been normal. Baby is feeding by breast. Bleeding thin lochia. Bowel function is normal. Bladder function is normal. Patient is not sexually active. Contraception method is abstinence. Postpartum depression screening: negative.  Tobacco, alcohol and substance abuse history reviewed.  Adult immunizations reviewed including TDAP, rubella and varicella.  The following portions of the patient's history were reviewed and updated as appropriate: allergies, current medications, past family history, past medical history, past social history, past surgical history and problem list.  Review of Systems A comprehensive review of systems was negative.   Objective:    BP 157/87  Pulse 122  Temp(Src) 98.7 F (37.1 C)  Ht 5\' 2"  (1.575 m)  Wt 249 lb (112.946 kg)  BMI 45.53 kg/m2  Breastfeeding? Yes   PE:      Abdomen:  Soft, NT.  Incision C, D, I.                         Staples removed and steri strips applied.   Assessment:    1 week postpartum.  Doing well.  Plan:    1. Contraception: abstinence 2. Postoperative wound management discussed. 3. Follow up in: 2 weeks or as needed.   Healthy lifestyle practices reviewed

## 2014-02-25 ENCOUNTER — Ambulatory Visit (INDEPENDENT_AMBULATORY_CARE_PROVIDER_SITE_OTHER): Payer: Medicaid Other | Admitting: Obstetrics

## 2014-02-25 ENCOUNTER — Encounter: Payer: Self-pay | Admitting: Obstetrics

## 2014-02-25 DIAGNOSIS — F329 Major depressive disorder, single episode, unspecified: Secondary | ICD-10-CM

## 2014-02-25 DIAGNOSIS — F3289 Other specified depressive episodes: Secondary | ICD-10-CM

## 2014-02-25 DIAGNOSIS — Z3009 Encounter for other general counseling and advice on contraception: Secondary | ICD-10-CM

## 2014-02-25 MED ORDER — SERTRALINE HCL 50 MG PO TABS: 50.0000 mg | ORAL_TABLET | Freq: Every day | ORAL | Status: DC

## 2014-02-25 NOTE — Progress Notes (Signed)
Subjective:     Felicia Holt is a 18 y.o. female who presents for a postpartum visit. She is 2 weeks postpartum following a LTCS. I have fully reviewed the prenatal and intrapartum course. The delivery was at 40 gestational weeks. Outcome: primary cesarean section, low transverse incision. Anesthesia: epidural. Postpartum course has been normal. Baby's course has been normal. Baby is feeding by bottle Felicia Holt. Bleeding thin lochia. Bowel function is normal. Bladder function is normal. Patient is not sexually active. Contraception method is abstinence. Postpartum depression screening: positive.  Tobacco, alcohol and substance abuse history reviewed.  Adult immunizations reviewed including TDAP, rubella and varicella.  The following portions of the patient's history were reviewed and updated as appropriate: allergies, current medications, past family history, past medical history, past social history, past surgical history and problem list.  Review of Systems A comprehensive review of systems was negative.   Objective:    BP 116/72  Pulse 85  Temp(Src) 98.3 F (36.8 C)  Wt 224 lb (101.606 kg)   PE:  Deferred    Assessment:    Postpartum, 2 weeks.  Doing well.  Postpartum Depression, mild.  Plan:    1. Contraception: Nexplanon 2. Zoloft Rx. 3. Follow up in: 4 weeks or as needed.   Healthy lifestyle practices reviewed

## 2014-03-25 ENCOUNTER — Encounter: Payer: Self-pay | Admitting: Obstetrics & Gynecology

## 2014-03-25 ENCOUNTER — Ambulatory Visit (INDEPENDENT_AMBULATORY_CARE_PROVIDER_SITE_OTHER): Payer: Medicaid Other | Admitting: Obstetrics & Gynecology

## 2014-03-25 NOTE — Progress Notes (Signed)
Patient ID: Felicia Holt, female   DOB: 1995-08-21, 18 y.o.   MRN: [id number] Subjective:     Felicia Holt is a 18 y.o. female who presents for a postpartum visit. She is 6 weeks postpartum following a low cervical transverse Cesarean section. I have fully reviewed the prenatal and intrapartum course. The delivery was at 40 gestational weeks. Outcome: primary cesarean section, low transverse incision. Anesthesia: spinal. Postpartum course has been uncomplicated. Baby's course has been normal. Baby is feeding by bottle. Bleeding no bleeding. Bowel function is normal. Bladder function is normal. Patient is not sexually active. Contraception method is abstinence. Postpartum depression screening: negative.  Tobacco, alcohol and substance abuse history reviewed.  Adult immunizations reviewed including TDAP, rubella and varicella.  The following portions of the patient's history were reviewed and updated as appropriate: allergies, current medications, past family history, past medical history, past social history, past surgical history and problem list.  Review of Systems Pertinent items are noted in HPI.   Objective:    BP 130/77  Pulse 77  Temp(Src) 97.3 F (36.3 C)  Ht  (1.575 m)  Wt 104.327 kg (230 lb)  BMI 42.06 kg/m2  Breastfeeding? No        General:   alert  Skin:   no rash or abnormalities  Lungs:   clear to auscultation bilaterally  Heart:   regular rate and rhythm, S1, S2 normal, no murmur, click, rub or gallop  Abdomen:  normal findings: no organomegaly, soft, non-tender and no hernia; incision well-healed  Pelvis:  External genitalia: normal general appearance Urinary system: urethral meatus normal and bladder without fullness, nontender Vaginal: normal without tenderness, induration or masses Cervix: normal appearance Adnexa: normal bimanual exam Uterus: anteverted and non-tender, normal size     Assessment:     Normal postpartum exam.  Plan:    1. Contraception:  plans Nexplanon 2. 2 hr GTT at next visit; check Varicella titers 3. Follow up as needed.  2hr GTT for h/o GDM/screening for DM q 3 yrs per ADA recommendations Preconception counseling provided Healthy lifestyle practices reviewed

## 2014-04-05 ENCOUNTER — Telehealth: Payer: Self-pay | Admitting: *Deleted

## 2014-04-05 NOTE — Telephone Encounter (Signed)
Patient is interested in Nexplanon for contraception. Contacted patient and scheduled for April 14, 2014 @ 9:30 am. Patient states she has not not been sexually active since delivery.  Patient advised she is to complete a 2 hr gtt on the same day as insertion. Patient given instructions and verbalized understanding.

## 2014-04-14 ENCOUNTER — Other Ambulatory Visit: Payer: Medicaid Other

## 2014-04-14 ENCOUNTER — Other Ambulatory Visit: Payer: Self-pay | Admitting: Obstetrics & Gynecology

## 2014-04-14 ENCOUNTER — Ambulatory Visit (INDEPENDENT_AMBULATORY_CARE_PROVIDER_SITE_OTHER): Payer: Medicaid Other | Admitting: Obstetrics & Gynecology

## 2014-04-14 VITALS — BP 115/77 | HR 71 | Temp 98.1°F | Wt 229.0 lb

## 2014-04-14 DIAGNOSIS — Z30017 Encounter for initial prescription of implantable subdermal contraceptive: Secondary | ICD-10-CM

## 2014-04-14 DIAGNOSIS — Z3049 Encounter for surveillance of other contraceptives: Secondary | ICD-10-CM

## 2014-04-14 DIAGNOSIS — Z8632 Personal history of gestational diabetes: Secondary | ICD-10-CM

## 2014-04-14 DIAGNOSIS — Z3202 Encounter for pregnancy test, result negative: Secondary | ICD-10-CM

## 2014-04-14 LAB — POCT URINE PREGNANCY: Preg Test, Ur: NEGATIVE

## 2014-04-15 LAB — GLUCOSE TOLERANCE, 2 HOURS W/ 1HR
Glucose, 1 hour: 86 mg/dL (ref 70–170)
Glucose, 2 hour: 74 mg/dL (ref 70–139)
Glucose, Fasting: 71 mg/dL (ref 70–99)

## 2014-04-16 NOTE — Progress Notes (Signed)
NEXPLANON INSERTION NOTE  BP 115/77  Pulse 71  Temp(Src) 98.1 F (36.7 C)  Wt 103.874 kg (229 lb)  LMP 04/09/2014   Pregnancy test result:  Lab Results  Component Value Date   PREGTESTUR Negative 04/14/2014   HCG 22772.0 08/07/2013    Indications:  The patient desires contraception.  She understands risks, benefits, and alternatives to Implanon and would like to proceed.  Anesthesia:   Lidocaine 1% plain.  Procedure:  A time-out was performed confirming the procedure and the patient's allergy status.  The patient's non-dominant was identified as the left arm.  The protection cap was removed. While placing countertraction on the skin, the needle was inserted at a 30 degree angle.  The applicator was held horizontal to the skin; the skin was tented upward as the needle was introduced into the subdermal space.  While holding the applicator in place, the slider was unlocked. The Nexplanon was removed from the field.  The Nexplanon was palpated to ensure proper placement.  Complications: None  Instructions:  The patient was instructed to remove the dressing in 24 hours and that some bruising is to be expected.  She was advised to use over the counter analgesics as needed for any pain at the site.  She is to keep the area dry for 24 hours and to call if her hand or arm becomes cold, numb, or blue.  Return visit:  As needed

## 2014-04-21 LAB — VARICELLA ZOSTER ANTIBODY, IGG: Varicella IgG: 732.6 Index — ABNORMAL HIGH (ref <135.00)

## 2014-04-22 ENCOUNTER — Encounter: Payer: Self-pay | Admitting: *Deleted

## 2014-05-03 ENCOUNTER — Encounter: Payer: Self-pay | Admitting: Obstetrics & Gynecology

## 2014-06-28 ENCOUNTER — Encounter: Payer: Self-pay | Admitting: *Deleted

## 2014-06-29 ENCOUNTER — Encounter: Payer: Self-pay | Admitting: Obstetrics & Gynecology

## 2014-10-31 IMAGING — US US OB DETAIL+14 WK
1 series · 12 of 28 positions shown · non-contrast
Comparison: none

[Series 1: us ob detail +14 wk · 12 of 82 slices shown]
[im 4/82]
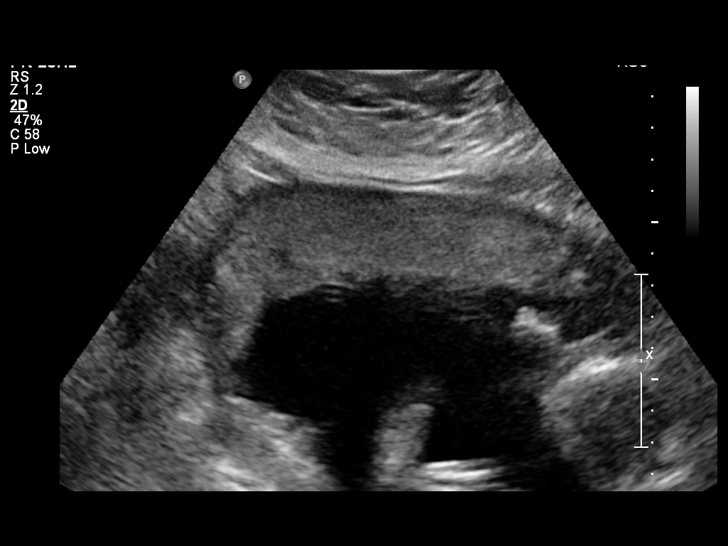
[im 10/82]
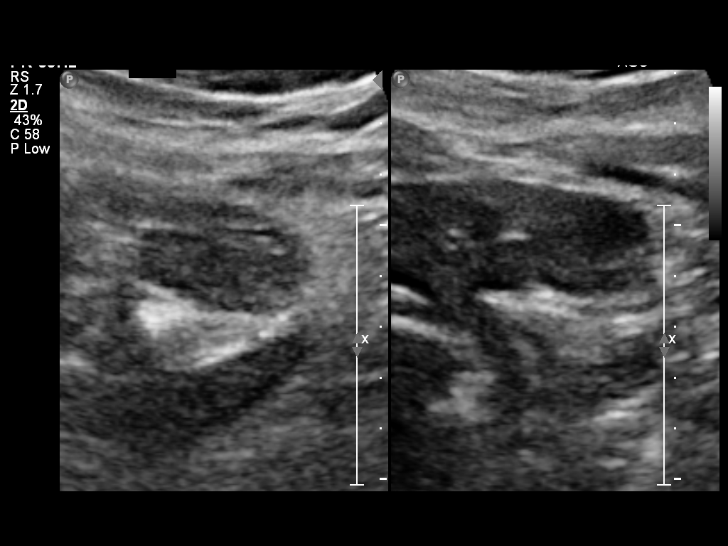
[im 16/82]
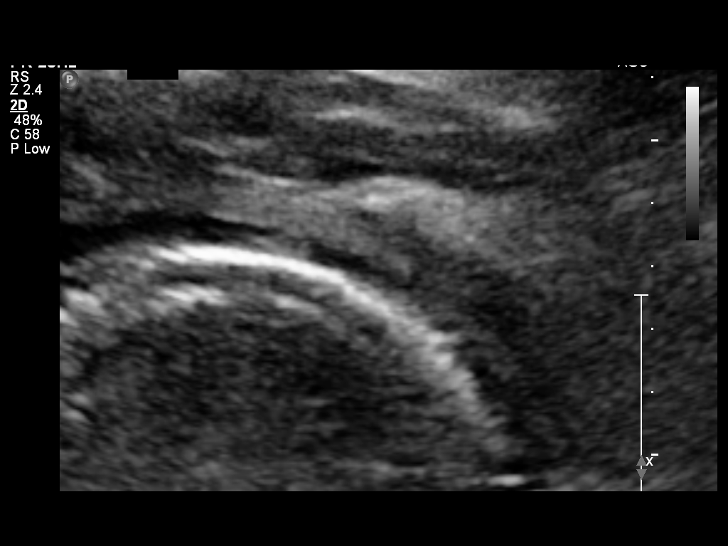
[im 25/82]
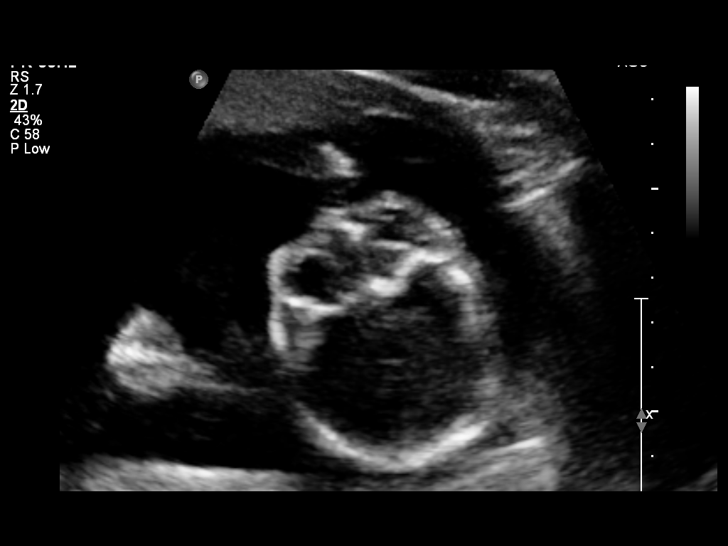
[im 31/82]
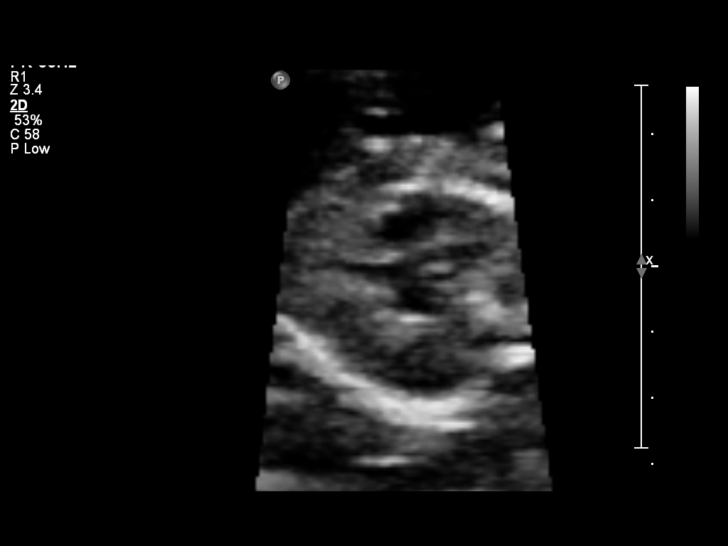
[im 37/82]
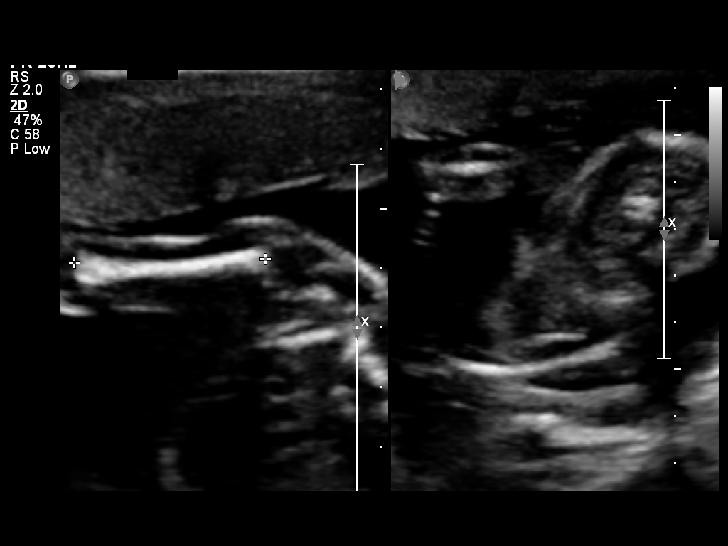
[im 46/82]
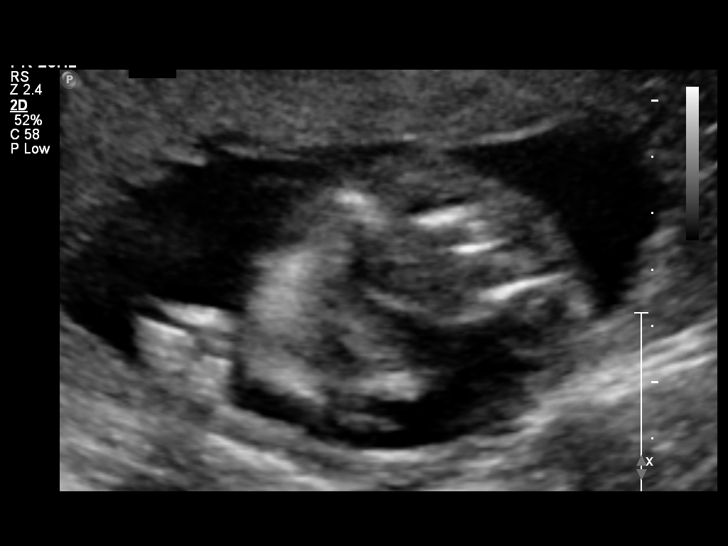
[im 52/82]
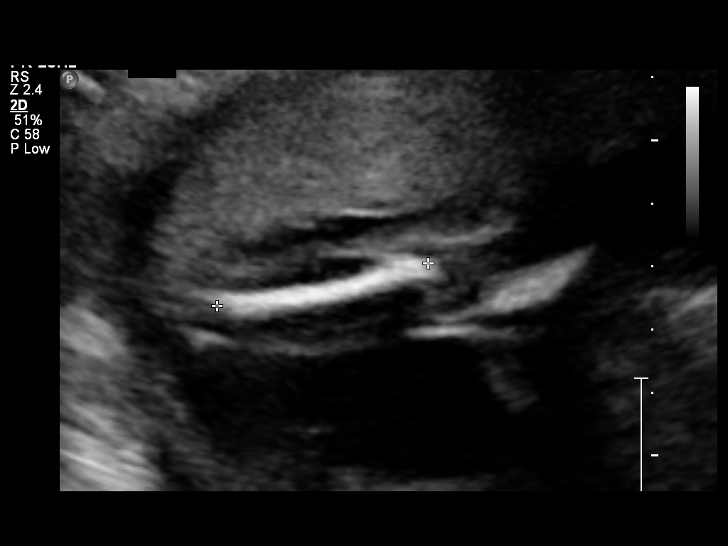
[im 58/82]
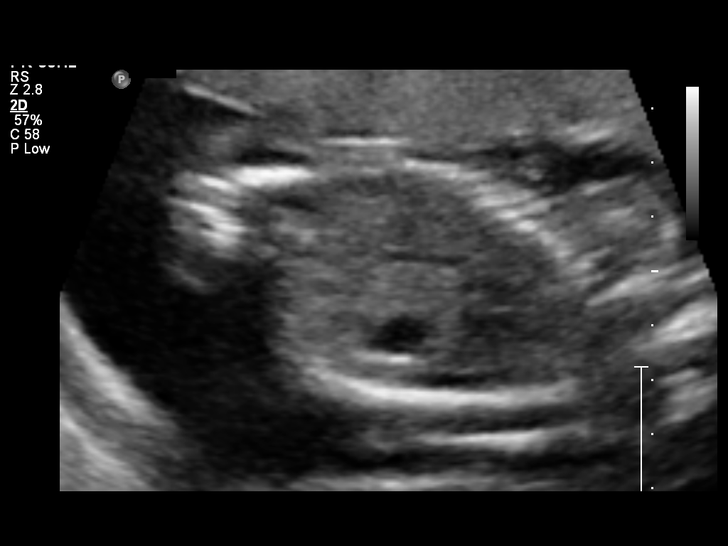
[im 67/82]
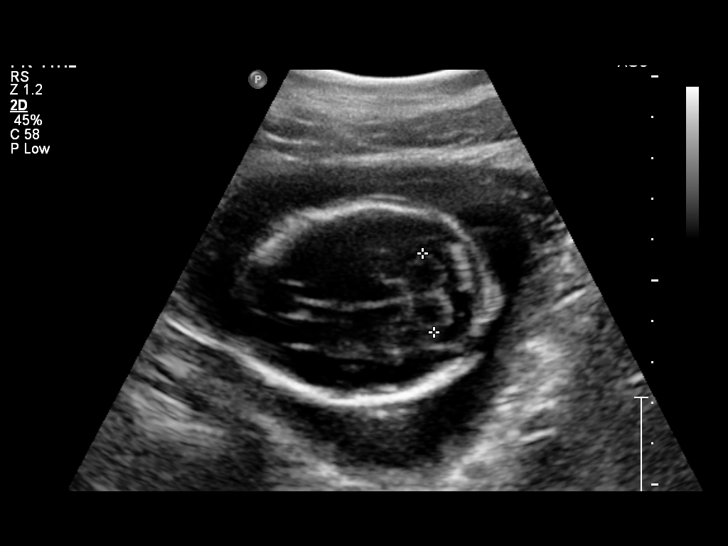
[im 73/82]
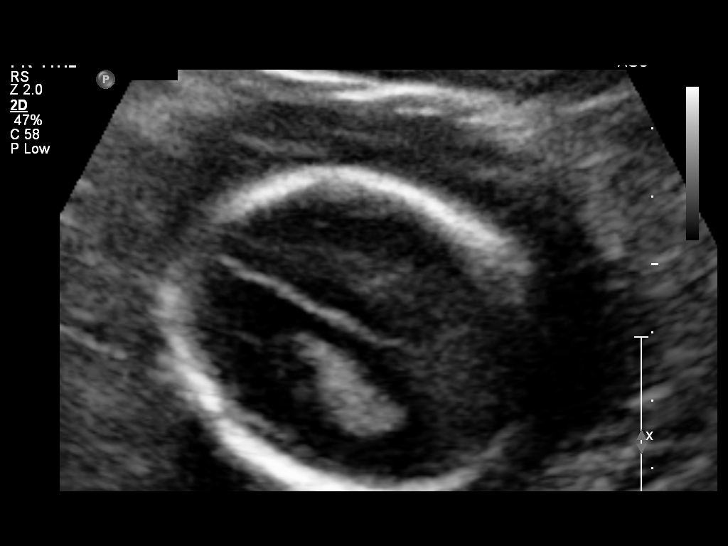
[im 79/82]
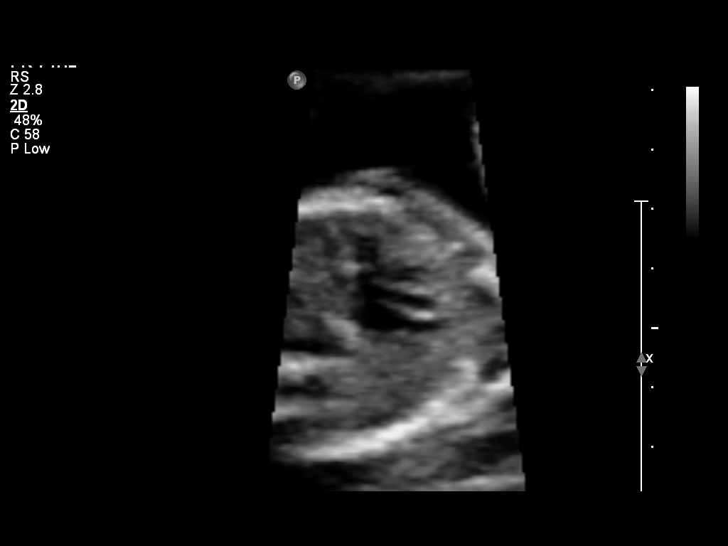

[12 of 28 positions shown; findings below may reference images not displayed]

OBSTETRICS REPORT
                      (Signed Final 09/16/2013 [DATE])

Service(s) Provided

 US OB DETAIL + 14 WK                                  76811.0
Indications

 Detailed fetal anatomic survey
 Maternal morbid obesity
Fetal Evaluation

 Num Of Fetuses:    1
 Fetal Heart Rate:  156                          bpm
 Cardiac Activity:  Observed
 Presentation:      Cephalic
 Placenta:          Anterior, above cervical os
 P. Cord            Visualized, central
 Insertion:

 Amniotic Fluid
 AFI FV:      Subjectively within normal limits
                                             Larg Pckt:    5.12  cm
Biometry

 BPD:     47.1  mm     G. Age:  20w 2d                CI:        71.34   70 - 86
                                                      FL/HC:      18.6   16.8 -

 HC:     177.6  mm     G. Age:  20w 2d       47  %    HC/AC:      1.15   1.09 -

 AC:     154.6  mm     G. Age:  20w 5d       61  %    FL/BPD:
 FL:      33.1  mm     G. Age:  20w 2d       50  %    FL/AC:      21.4   20 - 24
 HUM:       32  mm     G. Age:  20w 5d       68  %
 CER:     19.4  mm     G. Age:  18w 5d       11  %

 Est. FW:     356  gm    0 lb 13 oz      53  %
Gestational Age

 U/S Today:     20w 3d                                        EDD:   01/31/14
 Best:          20w 1d     Det. By:  Previous Ultrasound      EDD:   02/02/14
Anatomy

 Cranium:          Appears normal         Aortic Arch:      Not well visualized
 Fetal Cavum:      Appears normal         Ductal Arch:      Appears normal
 Ventricles:       Appears normal         Diaphragm:        Not well visualized
 Choroid Plexus:   Appears normal         Stomach:          Appears normal
 Cerebellum:       Appears normal         Abdomen:          Appears normal
 Posterior Fossa:  Appears normal         Abdominal Wall:   Appears nml (cord
                                                            insert, abd wall)
 Nuchal Fold:      Not applicable (>20    Cord Vessels:     Appears normal (3
                   wks GA)                                  vessel cord)
 Face:             Orbits appear          Kidneys:          Appear normal
                   normal
 Lips:             Appears normal         Bladder:          Appears normal
 Heart:            Echogenic focus        Spine:            Appears normal
                   in LV
 RVOT:             Not well visualized    Lower             Appears normal
                                          Extremities:
 LVOT:             Appears normal         Upper             Appears normal
                                          Extremities:

 Other:  Fetus appears to be a male. Heels visualized.  Right 5th visualized.
         Technically difficult due to maternal habitus and fetal position.
Targeted Anatomy

 Fetal Central Nervous System
 Cisterna Magna:
Cervix Uterus Adnexa

 Cervical Length:    3.98     cm

 Cervix:       Normal appearance by transabdominal scan.
 Uterus:       No abnormality visualized.
 Cul De Sac:   No free fluid seen.

 Left Ovary:    Within normal limits.
 Right Ovary:   Not visualized.
 Adnexa:     No abnormality visualized.
Comments

 An echogenic focus was seen in the left cardiac ventricle.
 This is felt to represent a calcified papillary muscle, and is not
 associated with structural or functional cardiac abnormalities.
 Although an echogenic cardiac focus may be associated with
 an increased risk of Down syndrome, this risk is felt to be
 minimal, especially when it is seen as an isolated finding.
Impression

 Single IUP at 05w2d
 Echogenic intracardiac focus noted in the left ventricle (see
 comments)
 Fetal anatomy otherwise normal.  Limited views of the heart
 and face obtained due to fetal position and maternal body
 habitus
 No other markers associated with aneuploidy detected
 Normal amniotic fluid volume
Recommendations

 Recommend follow-up ultrasound examination in 4 weeks to
 reevalaute the fetal heart and face (please schedule)
 questions or concerns.

## 2014-12-26 ENCOUNTER — Emergency Department (HOSPITAL_COMMUNITY): Payer: Medicaid Other

## 2014-12-26 ENCOUNTER — Encounter (HOSPITAL_COMMUNITY): Payer: Self-pay | Admitting: Emergency Medicine

## 2014-12-26 ENCOUNTER — Inpatient Hospital Stay (HOSPITAL_COMMUNITY)
Admission: EM | Admit: 2014-12-26 | Discharge: 2015-01-10 | DRG: 870 | Discharge status: Against Medical Advice | Payer: Medicaid Other | Attending: Internal Medicine | Admitting: Internal Medicine

## 2014-12-26 DIAGNOSIS — F32A Depression, unspecified: Secondary | ICD-10-CM | POA: Diagnosis present

## 2014-12-26 DIAGNOSIS — G9340 Encephalopathy, unspecified: Secondary | ICD-10-CM | POA: Diagnosis not present

## 2014-12-26 DIAGNOSIS — J810 Acute pulmonary edema: Secondary | ICD-10-CM | POA: Diagnosis present

## 2014-12-26 DIAGNOSIS — Z452 Encounter for adjustment and management of vascular access device: Secondary | ICD-10-CM

## 2014-12-26 DIAGNOSIS — D649 Anemia, unspecified: Secondary | ICD-10-CM | POA: Diagnosis present

## 2014-12-26 DIAGNOSIS — E872 Acidosis: Secondary | ICD-10-CM | POA: Diagnosis not present

## 2014-12-26 DIAGNOSIS — E119 Type 2 diabetes mellitus without complications: Secondary | ICD-10-CM | POA: Diagnosis present

## 2014-12-26 DIAGNOSIS — Z79899 Other long term (current) drug therapy: Secondary | ICD-10-CM | POA: Diagnosis not present

## 2014-12-26 DIAGNOSIS — N289 Disorder of kidney and ureter, unspecified: Secondary | ICD-10-CM | POA: Diagnosis present

## 2014-12-26 DIAGNOSIS — I270 Primary pulmonary hypertension: Secondary | ICD-10-CM | POA: Diagnosis not present

## 2014-12-26 DIAGNOSIS — E785 Hyperlipidemia, unspecified: Secondary | ICD-10-CM | POA: Diagnosis present

## 2014-12-26 DIAGNOSIS — Z01818 Encounter for other preprocedural examination: Secondary | ICD-10-CM

## 2014-12-26 DIAGNOSIS — M7989 Other specified soft tissue disorders: Secondary | ICD-10-CM | POA: Diagnosis not present

## 2014-12-26 DIAGNOSIS — E8770 Fluid overload, unspecified: Secondary | ICD-10-CM | POA: Diagnosis not present

## 2014-12-26 DIAGNOSIS — J9601 Acute respiratory failure with hypoxia: Secondary | ICD-10-CM | POA: Diagnosis present

## 2014-12-26 DIAGNOSIS — R000 Tachycardia, unspecified: Secondary | ICD-10-CM

## 2014-12-26 DIAGNOSIS — W19XXXA Unspecified fall, initial encounter: Secondary | ICD-10-CM

## 2014-12-26 DIAGNOSIS — E870 Hyperosmolality and hypernatremia: Secondary | ICD-10-CM | POA: Diagnosis not present

## 2014-12-26 DIAGNOSIS — I959 Hypotension, unspecified: Secondary | ICD-10-CM | POA: Diagnosis present

## 2014-12-26 DIAGNOSIS — N179 Acute kidney failure, unspecified: Secondary | ICD-10-CM | POA: Diagnosis present

## 2014-12-26 DIAGNOSIS — R109 Unspecified abdominal pain: Secondary | ICD-10-CM | POA: Diagnosis present

## 2014-12-26 DIAGNOSIS — I272 Other secondary pulmonary hypertension: Secondary | ICD-10-CM | POA: Diagnosis present

## 2014-12-26 DIAGNOSIS — I10 Essential (primary) hypertension: Secondary | ICD-10-CM | POA: Diagnosis present

## 2014-12-26 DIAGNOSIS — R6520 Severe sepsis without septic shock: Secondary | ICD-10-CM | POA: Diagnosis present

## 2014-12-26 DIAGNOSIS — K219 Gastro-esophageal reflux disease without esophagitis: Secondary | ICD-10-CM | POA: Diagnosis present

## 2014-12-26 DIAGNOSIS — E1165 Type 2 diabetes mellitus with hyperglycemia: Secondary | ICD-10-CM | POA: Diagnosis present

## 2014-12-26 DIAGNOSIS — A419 Sepsis, unspecified organism: Secondary | ICD-10-CM | POA: Diagnosis present

## 2014-12-26 DIAGNOSIS — J9690 Respiratory failure, unspecified, unspecified whether with hypoxia or hypercapnia: Secondary | ICD-10-CM

## 2014-12-26 DIAGNOSIS — E876 Hypokalemia: Secondary | ICD-10-CM | POA: Diagnosis not present

## 2014-12-26 DIAGNOSIS — F329 Major depressive disorder, single episode, unspecified: Secondary | ICD-10-CM | POA: Diagnosis present

## 2014-12-26 DIAGNOSIS — Z6841 Body Mass Index (BMI) 40.0 and over, adult: Secondary | ICD-10-CM | POA: Diagnosis not present

## 2014-12-26 DIAGNOSIS — J811 Chronic pulmonary edema: Secondary | ICD-10-CM | POA: Diagnosis present

## 2014-12-26 DIAGNOSIS — N12 Tubulo-interstitial nephritis, not specified as acute or chronic: Secondary | ICD-10-CM | POA: Diagnosis present

## 2014-12-26 DIAGNOSIS — R1084 Generalized abdominal pain: Secondary | ICD-10-CM | POA: Diagnosis not present

## 2014-12-26 DIAGNOSIS — I9589 Other hypotension: Secondary | ICD-10-CM

## 2014-12-26 DIAGNOSIS — K5900 Constipation, unspecified: Secondary | ICD-10-CM | POA: Diagnosis not present

## 2014-12-26 DIAGNOSIS — J80 Acute respiratory distress syndrome: Secondary | ICD-10-CM

## 2014-12-26 DIAGNOSIS — Z978 Presence of other specified devices: Secondary | ICD-10-CM

## 2014-12-26 DIAGNOSIS — R0902 Hypoxemia: Secondary | ICD-10-CM

## 2014-12-26 DIAGNOSIS — R918 Other nonspecific abnormal finding of lung field: Secondary | ICD-10-CM

## 2014-12-26 DIAGNOSIS — N10 Acute tubulo-interstitial nephritis: Secondary | ICD-10-CM | POA: Diagnosis present

## 2014-12-26 LAB — URINE MICROSCOPIC-ADD ON

## 2014-12-26 LAB — COMPREHENSIVE METABOLIC PANEL
ALK PHOS: 149 U/L — AB (ref 38–126)
ALT: 29 U/L (ref 14–54)
AST: 25 U/L (ref 15–41)
Albumin: 2.8 g/dL — ABNORMAL LOW (ref 3.5–5.0)
Anion gap: 11 (ref 5–15)
BUN: 15 mg/dL (ref 6–20)
CALCIUM: 8.3 mg/dL — AB (ref 8.9–10.3)
CO2: 23 mmol/L (ref 22–32)
Chloride: 101 mmol/L (ref 101–111)
Creatinine, Ser: 1.91 mg/dL — ABNORMAL HIGH (ref 0.44–1.00)
GFR calc non Af Amer: 37 mL/min — ABNORMAL LOW (ref >60)
GFR, EST AFRICAN AMERICAN: 42 mL/min — AB (ref >60)
Glucose, Bld: 225 mg/dL — ABNORMAL HIGH (ref 65–99)
POTASSIUM: 3.2 mmol/L — AB (ref 3.5–5.1)
SODIUM: 135 mmol/L (ref 135–145)
Total Bilirubin: 1.4 mg/dL — ABNORMAL HIGH (ref 0.3–1.2)
Total Protein: 7.2 g/dL (ref 6.5–8.1)

## 2014-12-26 LAB — URINALYSIS, ROUTINE W REFLEX MICROSCOPIC
Glucose, UA: >1000 mg/dL — AB
KETONES UR: NEGATIVE mg/dL
NITRITE: NEGATIVE
Protein, ur: 100 mg/dL — AB
Specific Gravity, Urine: 1.024 (ref 1.005–1.030)
Urobilinogen, UA: 2 mg/dL — ABNORMAL HIGH (ref 0.0–1.0)
pH: 5.5 (ref 5.0–8.0)

## 2014-12-26 LAB — CBC WITH DIFFERENTIAL/PLATELET
BASOS ABS: 0 10*3/uL (ref 0.0–0.1)
BASOS PCT: 0 % (ref 0–1)
Eosinophils Absolute: 0 10*3/uL (ref 0.0–0.7)
Eosinophils Relative: 0 % (ref 0–5)
HCT: 34.5 % — ABNORMAL LOW (ref 36.0–46.0)
HEMOGLOBIN: 11.6 g/dL — AB (ref 12.0–15.0)
LYMPHS ABS: 0.8 10*3/uL (ref 0.7–4.0)
Lymphocytes Relative: 4 % — ABNORMAL LOW (ref 12–46)
MCH: 26.1 pg (ref 26.0–34.0)
MCHC: 33.6 g/dL (ref 30.0–36.0)
MCV: 77.7 fL — ABNORMAL LOW (ref 78.0–100.0)
MONO ABS: 1.8 10*3/uL — AB (ref 0.1–1.0)
MONOS PCT: 9 % (ref 3–12)
NEUTROS ABS: 18.2 10*3/uL — AB (ref 1.7–7.7)
Neutrophils Relative %: 87 % — ABNORMAL HIGH (ref 43–77)
PLATELETS: 278 10*3/uL (ref 150–400)
RBC: 4.44 MIL/uL (ref 3.87–5.11)
RDW: 14.5 % (ref 11.5–15.5)
WBC: 20.7 10*3/uL — ABNORMAL HIGH (ref 4.0–10.5)

## 2014-12-26 LAB — I-STAT CG4 LACTIC ACID, ED: Lactic Acid, Venous: 2.07 mmol/L (ref 0.5–2.0)

## 2014-12-26 LAB — POC URINE PREG, ED: Preg Test, Ur: NEGATIVE

## 2014-12-26 MED ORDER — PIPERACILLIN-TAZOBACTAM 3.375 G IVPB 30 MIN
3.3750 g | Freq: Once | INTRAVENOUS | Status: AC
Start: 2014-12-26 — End: 2014-12-27
  Administered 2014-12-27: 3.375 g via INTRAVENOUS
  Filled 2014-12-26: qty 50

## 2014-12-26 MED ORDER — METRONIDAZOLE IN NACL 5-0.79 MG/ML-% IV SOLN
500.0000 mg | Freq: Three times a day (TID) | INTRAVENOUS | Status: DC
  Filled 2014-12-26 (×2): qty 100

## 2014-12-26 MED ORDER — SODIUM CHLORIDE 0.9 % IV SOLN
Freq: Once | INTRAVENOUS | Status: AC
  Administered 2014-12-26: 22:00:00 via INTRAVENOUS

## 2014-12-26 MED ORDER — VANCOMYCIN HCL IN DEXTROSE 1-5 GM/200ML-% IV SOLN: 1000.0000 mg | Freq: Once | INTRAVENOUS | Status: DC

## 2014-12-26 MED ORDER — DEXTROSE 5 % IV SOLN
1.0000 g | INTRAVENOUS | Status: DC
  Administered 2014-12-26: 1 g via INTRAVENOUS
  Filled 2014-12-26: qty 10

## 2014-12-26 MED ORDER — SODIUM CHLORIDE 0.9 % IV BOLUS (SEPSIS)
1000.0000 mL | Freq: Once | INTRAVENOUS | Status: AC
  Administered 2014-12-26: 1000 mL via INTRAVENOUS

## 2014-12-26 MED ORDER — SODIUM CHLORIDE 0.9 % IV BOLUS (SEPSIS)
750.0000 mL | Freq: Once | INTRAVENOUS | Status: AC
  Administered 2014-12-27: 750 mL via INTRAVENOUS

## 2014-12-26 MED ORDER — IOHEXOL 300 MG/ML  SOLN
80.0000 mL | Freq: Once | INTRAMUSCULAR | Status: AC | PRN
  Administered 2014-12-26: 100 mL via INTRAVENOUS

## 2014-12-26 MED ORDER — FENTANYL CITRATE (PF) 100 MCG/2ML IJ SOLN: 50.0000 ug | INTRAMUSCULAR | Status: DC | PRN

## 2014-12-26 MED ORDER — KETOROLAC TROMETHAMINE 30 MG/ML IJ SOLN
30.0000 mg | Freq: Once | INTRAMUSCULAR | Status: AC
  Administered 2014-12-26: 30 mg via INTRAVENOUS
  Filled 2014-12-26: qty 1

## 2014-12-26 NOTE — H&P (Signed)
[facility] Admission History and Physical       Curtis Uriarte BJY:[id number] DOB: Jun 27, 1996 DOA: 12/26/2014  Referring physician: EDP PCP: Pcp Not In System  Specialists:   Chief Complaint: ABD and Flank Pain  HPI: Felicia Holt is a 19 y.o. female with a history of Gestational Diabetes who presents to the ED with complaints of Upper ABD Pain and Left sided Flank Pain x 1 week.   She denies any fevers or chills or dysuria.     A CT scan of the ABD was performed and revealed Bilateral Pyelonephritis, and she was found to have Hypotension and Tachycardia and was placed on IV Vancomycin and Zosyn and referred for admision.      Review of Systems:  Constitutional: No Weight Loss, No Weight Gain, Night Sweats, Fevers, Chills, Dizziness, Light Headedness, Fatigue, or Generalized Weakness HEENT: No Headaches, Difficulty Swallowing,Tooth/Dental Problems,Sore Throat,  No Sneezing, Rhinitis, Ear Ache, Nasal Congestion, or Post Nasal Drip,  Cardio-vascular:  No Chest pain, Orthopnea, PND, Edema in Lower Extremities, Anasarca, Dizziness, Palpitations  Resp: No Dyspnea, No DOE, No Productive Cough, No Non-Productive Cough, No Hemoptysis, No Wheezing.    GI: No Heartburn, Indigestion, +Abdominal Pain, Nausea, Vomiting, Diarrhea, Constipation, Hematemesis, Hematochezia, Melena, Change in Bowel Habits,  Loss of Appetite  GU: No Dysuria, No Change in Color of Urine, No Urgency or Urinary Frequency, +Flank pain.  Musculoskeletal: No Joint Pain or Swelling, No Decreased Range of Motion, No Back Pain.  Neurologic: No Syncope, No Seizures, Muscle Weakness, Paresthesia, Vision Disturbance or Loss, No Diplopia, No Vertigo, No Difficulty Walking,  Skin: No Rash or Lesions. Psych: No Change in Mood or Affect, No Depression or Anxiety, No Memory loss, No Confusion, or Hallucinations   Past Medical History  Diagnosis Date  . Gestational diabetes   . GERD (gastroesophageal reflux disease)   . Morbid  obesity      Past Surgical History  Procedure Laterality Date  . Tonsillectomy    . Wisdom tooth extraction    . Adenoidectomy    . Cesarean section N/A 02/03/2014    Procedure: CESAREAN SECTION;  Surgeon: Antionette Char, MD;  Location: [facility];  Service: Obstetrics;  Laterality: N/A;      Prior to Admission medications   Medication Sig Start Date End Date Taking? Authorizing Provider  ibuprofen (ADVIL,MOTRIN) 200 MG tablet Take 400 mg by mouth every 6 (six) hours as needed for moderate pain.   Yes Historical Provider, MD  hydrochlorothiazide (MICROZIDE) 12.5 MG capsule Take 1 capsule (12.5 mg total) by mouth daily. 02/07/14   Antionette Char, MD  Prenatal Vit-Fe Fumarate-FA (PRENATAL MULTIVITAMIN) TABS tablet Take 1 tablet by mouth daily at 12 noon.    Historical Provider, MD  sertraline (ZOLOFT) 50 MG tablet Take 1 tablet (50 mg total) by mouth daily. 02/25/14   Brock Bad, MD     No Known Allergies  Social History:  reports that she has never smoked. She has never used smokeless tobacco. She reports that she does not drink alcohol or use illicit drugs.    Family History  Problem Relation Age of Onset  . Hypertension Mother   . Depression Mother   . Hypertension Father   . Depression Father   . COPD Maternal Grandmother   . Hypertension Maternal Grandmother   . Cancer Maternal Grandmother   . Depression Maternal Grandmother   . Learning disabilities Brother   . Early death Paternal Grandmother      Physical Exam:  GEN:  Obese  19 y.o. African American female examined and in no acute distress; cooperative with exam Filed Vitals:   12/26/14 1830 12/26/14 1930 12/26/14 1945 12/26/14 2130  BP: 101/49 103/52 95/47 96/48   Pulse: 112 109 108 113  Temp:      TempSrc:      Resp:      SpO2: 98% 100% 100% 100%   Blood pressure 96/48, pulse 113, temperature 100.6 F (38.1 C), temperature source Oral, resp. rate 16, last menstrual period 11/18/2014, SpO2 100 %, not  currently breastfeeding. PSYCH: She is alert and oriented x4; does not appear anxious does not appear depressed; affect is normal HEENT: Normocephalic and Atraumatic, Mucous membranes pink; PERRLA; EOM intact; Fundi:  Benign;  No scleral icterus, Nares: Patent, Oropharynx: Clear, Fair Dentition,    Neck:  FROM, No Cervical Lymphadenopathy nor Thyromegaly or Carotid Bruit; No JVD; Breasts:: Not examined CHEST WALL: No tenderness CHEST: Normal respiration, clear to auscultation bilaterally HEART: Regular rate and rhythm; no murmurs rubs or gallops BACK: No kyphosis or scoliosis; +CVA tenderness Bilaterally ABDOMEN: Positive Bowel Sounds, Obese, Soft +Tenderness in Upper Quadrants, No Rebound or Guarding; No Masses, No Organomegaly Rectal Exam: Not done EXTREMITIES: No Cyanosis, Clubbing, or Edema; No Ulcerations. Genitalia: not examined PULSES: 2+ and symmetric SKIN: Normal hydration no rash or ulceration CNS:  Alert and Oriented x 4, No Focal Deficits Vascular: pulses palpable throughout    Labs on Admission:  Basic Metabolic Panel:  Recent Labs Lab 12/26/14 1629  NA 135  K 3.2*  CL 101  CO2 23  GLUCOSE 225*  BUN 15  CREATININE 1.91*  CALCIUM 8.3*   Liver Function Tests:  Recent Labs Lab 12/26/14 1629  AST 25  ALT 29  ALKPHOS 149*  BILITOT 1.4*  PROT 7.2  ALBUMIN 2.8*   No results for input(s): LIPASE, AMYLASE in the last 168 hours. No results for input(s): AMMONIA in the last 168 hours. CBC:  Recent Labs Lab 12/26/14 1629  WBC 20.7*  NEUTROABS 18.2*  HGB 11.6*  HCT 34.5*  MCV 77.7*  PLT 278   Cardiac Enzymes: No results for input(s): CKTOTAL, CKMB, CKMBINDEX, TROPONINI in the last 168 hours.  BNP (last 3 results) No results for input(s): BNP in the last 8760 hours.  ProBNP (last 3 results) No results for input(s): PROBNP in the last 8760 hours.  CBG: No results for input(s): GLUCAP in the last 168 hours.  Radiological Exams on Admission: Ct  Abdomen Pelvis W Contrast  12/26/2014   CLINICAL DATA:  Left-sided abdominal pain and back pain for 1 week. Right hip pain after a fall 2 days ago.  EXAM: CT ABDOMEN AND PELVIS WITH CONTRAST  TECHNIQUE: Multidetector CT imaging of the abdomen and pelvis was performed using the standard protocol following bolus administration of intravenous contrast.  CONTRAST:  OMNIPAQUE IOHEXOL 300 MG/ML  SOLN  COMPARISON:  CT scan dated 12/05/2011  FINDINGS: Lower chest:  Normal.  Hepatobiliary: There is new hepatomegaly. The left lobe extends above and lateral to the spleen. No focal lesions. There is edema in the wall of the gallbladder. No dilated bile ducts.  Pancreas: Normal.  Spleen: Normal.  Adrenals/Urinary Tract: Both kidneys are diffusely edematous with slight perinephric soft tissue stranding. There are patchy focal areas of abnormal lucency in both kidneys, worse on the right than the left. The finding is consistent with bilateral pyelonephritis, right worse than left. No hydronephrosis. No renal stones. Bladder is normal.  Stomach/Bowel: Normal.  Vascular/Lymphatic: Normal.  Reproductive: Normal uterus and left ovary. 3 cm cyst on the right ovary, unchanged since the prior exam.  Other: No free air.  Musculoskeletal: Normal.  IMPRESSION: 1. Bilateral pyelonephritis. Both kidneys are diffusely edematous with multi focal areas of abnormal low-density in both kidneys. 2. New hepatomegaly.  This is nonspecific. 3. New edema of the gallbladder wall. No visible stones. The gallbladder is not distended.   Electronically Signed   By: Francene Boyers M.D.   On: 12/26/2014 21:01   Dg Abd Acute W/chest  12/26/2014   CLINICAL DATA:  Mid sternal chest pain. Left lower quadrant abdominal pain. Periumbilical pain. Left flank pain. Duration: 1 week. Nausea.  EXAM: DG ABDOMEN ACUTE W/ 1V CHEST  COMPARISON:  12/05/2011  FINDINGS: Mild enlargement of the cardiopericardial silhouette, without edema. Low lung volumes are present,  causing crowding of the pulmonary vasculature.  No free intraperitoneal gas is observed beneath the hemidiaphragms.  No significant abnormal air-fluid levels. No dilated bowel observed.  No significant abnormal calcifications.  IMPRESSION: 1. Unremarkable bowel gas pattern. 2. Mild enlargement of the cardiopericardial silhouette. This is likely partially attributable to the low lung volumes, but correlate with cardiac auscultation in determining whether further cardiac workup is warranted.   Electronically Signed   By: Gaylyn Rong M.D.   On: 12/26/2014 18:03      Assessment/Plan:   19 y.o. female with  Principal Problem:   1.     Sepsis- site Urine   IV Vancomycin and Zosyn      Active Problems:   2.    Pyelonephritis   Urine C+S sent   Urine for GC and Chlamydia     3.    Hypotension   IVFs     4.    AKI (acute kidney injury)   IVFs   Monitor BUN/Cr     5.    Abdominal pain   IV Fentanyl while Hypotensive     6.    DVT Prophylaxis   Lovenox          Code Status:     FULL CODE        Family Communication:    No Family Present    Disposition Plan:    Inpatient Status        Time spent:  18 Minutes      Ron Parker [facility] Pager [phone]   If 7AM -7PM Please Contact the Day Rounding Team MD for [facility]  If 7PM-7AM, Please Contact Night-Floor Coverage  [email or web address] Password [email or web address] 12/26/2014, 11:20 PM     ADDENDUM:   Patient was seen and examined on 12/26/2014

## 2014-12-26 NOTE — ED Notes (Signed)
Pt reports L abdominal pain and back pain x 1 week. Denies dysuria. sts she doesn't remember when last BM was. Pt also c/o R hip pain after falling down steps on Friday.

## 2014-12-26 NOTE — ED Notes (Signed)
DR. Lovell Sheehan made aware of patients blood pressure. Advised to give bolus

## 2014-12-26 NOTE — ED Notes (Signed)
Called CT to check on time status of when pt would be taken for scan, advised it will be a little longer before they are able to get to her

## 2014-12-26 NOTE — ED Provider Notes (Signed)
CSN: [id number]     Arrival date & time 12/26/14  1618 History   First MD Initiated Contact with Patient 12/26/14 1640     Chief Complaint  Patient presents with  . Abdominal Pain     (Consider location/radiation/quality/duration/timing/severity/associated sxs/prior Treatment) Patient is a 19 y.o. female presenting with abdominal pain. The history is provided by the patient. No language interpreter was used.  Abdominal Pain Pain location:  Generalized Pain quality: aching   Pain radiates to:  Does not radiate Pain severity:  Moderate Onset quality:  Gradual Duration:  1 week Timing:  Constant Progression:  Worsening Chronicity:  New Context: not alcohol use, not diet changes, not eating and not recent illness   Relieved by:  Nothing Worsened by:  Nothing tried Ineffective treatments:  None tried Associated symptoms: fever and vomiting   Associated symptoms: no vaginal discharge   Risk factors: not pregnant     Past Medical History  Diagnosis Date  . Gestational diabetes   . GERD (gastroesophageal reflux disease)   . Morbid obesity    Past Surgical History  Procedure Laterality Date  . Tonsillectomy    . Wisdom tooth extraction    . Adenoidectomy    . Cesarean section N/A 02/03/2014    Procedure: CESAREAN SECTION;  Surgeon: Antionette Char, MD;  Location: [facility];  Service: Obstetrics;  Laterality: N/A;   Family History  Problem Relation Age of Onset  . Hypertension Mother   . Depression Mother   . Hypertension Father   . Depression Father   . COPD Maternal Grandmother   . Hypertension Maternal Grandmother   . Cancer Maternal Grandmother   . Depression Maternal Grandmother   . Learning disabilities Brother   . Early death Paternal Grandmother    History  Substance Use Topics  . Smoking status: Never Smoker   . Smokeless tobacco: Never Used  . Alcohol Use: No   OB History    Gravida Para Term Preterm AB TAB SAB Ectopic Multiple Living   Review of Systems  Constitutional: Positive for fever.  Gastrointestinal: Positive for vomiting and abdominal pain.  Genitourinary: Negative for vaginal discharge.      Allergies  Review of patient's allergies indicates no known allergies.  Home Medications   Prior to Admission medications   Medication Sig Start Date End Date Taking? Authorizing Provider  hydrochlorothiazide (MICROZIDE) 12.5 MG capsule Take 1 capsule (12.5 mg total) by mouth daily. 02/07/14   Antionette Char, MD  Prenatal Vit-Fe Fumarate-FA (PRENATAL MULTIVITAMIN) TABS tablet Take 1 tablet by mouth daily at 12 noon.    Historical Provider, MD  sertraline (ZOLOFT) 50 MG tablet Take 1 tablet (50 mg total) by mouth daily. 02/25/14   Brock Bad, MD   BP 95/59 mmHg  Pulse 113  Temp(Src) 98.4 F (36.9 C) (Oral)  Resp 16  SpO2 99%  LMP 11/18/2014 Physical Exam  Constitutional: She is oriented to person, place, and time. She appears well-developed and well-nourished.  HENT:  Head: Normocephalic.  Right Ear: External ear normal.  Left Ear: External ear normal.  Nose: Nose normal.  Mouth/Throat: Oropharynx is clear and moist.  Eyes: EOM are normal. Pupils are equal, round, and reactive to light.  Neck: Normal range of motion.  Pulmonary/Chest: Effort normal.  Abdominal: She exhibits no distension. There is tenderness.  Musculoskeletal: Normal range of motion.  Neurological: She is alert and oriented to person,  place, and time.  Skin: Skin is warm.  Psychiatric: She has a normal mood and affect.  Nursing note and vitals reviewed.   ED Course  Procedures (including critical care time) Labs Review Labs Reviewed  CBC WITH DIFFERENTIAL/PLATELET - Abnormal; Notable for the following:    WBC 20.7 (*)    Hemoglobin 11.6 (*)    HCT 34.5 (*)    MCV 77.7 (*)    Neutrophils Relative % 87 (*)    Neutro Abs 18.2 (*)    Lymphocytes Relative 4 (*)    Monocytes Absolute 1.8 (*)    All other components within  normal limits  COMPREHENSIVE METABOLIC PANEL - Abnormal; Notable for the following:    Potassium 3.2 (*)    Glucose, Bld 225 (*)    Creatinine, Ser 1.91 (*)    Calcium 8.3 (*)    Albumin 2.8 (*)    Alkaline Phosphatase 149 (*)    Total Bilirubin 1.4 (*)    GFR calc non Af Amer 37 (*)    GFR calc Af Amer 42 (*)    All other components within normal limits  URINALYSIS, ROUTINE W REFLEX MICROSCOPIC (NOT AT [facility]) - Abnormal; Notable for the following:    Color, Urine AMBER (*)    APPearance TURBID (*)    Glucose, UA >1000 (*)    Hgb urine dipstick MODERATE (*)    Bilirubin Urine SMALL (*)    Protein, ur 100 (*)    Urobilinogen, UA 2.0 (*)    Leukocytes, UA MODERATE (*)    All other components within normal limits  URINE MICROSCOPIC-ADD ON - Abnormal; Notable for the following:    Squamous Epithelial / LPF MANY (*)    Bacteria, UA MANY (*)    All other components within normal limits  I-STAT CG4 LACTIC ACID, ED - Abnormal; Notable for the following:    Lactic Acid, Venous 2.07 (*)    All other components within normal limits  CULTURE, BLOOD (ROUTINE X 2)  CULTURE, BLOOD (ROUTINE X 2)  POC URINE PREG, ED  GC/CHLAMYDIA PROBE AMP ([place]) NOT AT [facility]    Imaging Review Ct Abdomen Pelvis W Contrast  12/26/2014   CLINICAL DATA:  Left-sided abdominal pain and back pain for 1 week. Right hip pain after a fall 2 days ago.  EXAM: CT ABDOMEN AND PELVIS WITH CONTRAST  TECHNIQUE: Multidetector CT imaging of the abdomen and pelvis was performed using the standard protocol following bolus administration of intravenous contrast.  CONTRAST:  OMNIPAQUE IOHEXOL 300 MG/ML  SOLN  COMPARISON:  CT scan dated 12/05/2011  FINDINGS: Lower chest:  Normal.  Hepatobiliary: There is new hepatomegaly. The left lobe extends above and lateral to the spleen. No focal lesions. There is edema in the wall of the gallbladder. No dilated bile ducts.  Pancreas: Normal.  Spleen: Normal.  Adrenals/Urinary Tract:  Both kidneys are diffusely edematous with slight perinephric soft tissue stranding. There are patchy focal areas of abnormal lucency in both kidneys, worse on the right than the left. The finding is consistent with bilateral pyelonephritis, right worse than left. No hydronephrosis. No renal stones. Bladder is normal.  Stomach/Bowel: Normal.  Vascular/Lymphatic: Normal.  Reproductive: Normal uterus and left ovary. 3 cm cyst on the right ovary, unchanged since the prior exam.  Other: No free air.  Musculoskeletal: Normal.  IMPRESSION: 1. Bilateral pyelonephritis. Both kidneys are diffusely edematous with multi focal areas of abnormal low-density in both kidneys. 2. New hepatomegaly.  This  is nonspecific. 3. New edema of the gallbladder wall. No visible stones. The gallbladder is not distended.   Electronically Signed   By: Francene Boyers M.D.   On: 12/26/2014 21:01   Dg Abd Acute W/chest  12/26/2014   CLINICAL DATA:  Mid sternal chest pain. Left lower quadrant abdominal pain. Periumbilical pain. Left flank pain. Duration: 1 week. Nausea.  EXAM: DG ABDOMEN ACUTE W/ 1V CHEST  COMPARISON:  12/05/2011  FINDINGS: Mild enlargement of the cardiopericardial silhouette, without edema. Low lung volumes are present, causing crowding of the pulmonary vasculature.  No free intraperitoneal gas is observed beneath the hemidiaphragms.  No significant abnormal air-fluid levels. No dilated bowel observed.  No significant abnormal calcifications.  IMPRESSION: 1. Unremarkable bowel gas pattern. 2. Mild enlargement of the cardiopericardial silhouette. This is likely partially attributable to the low lung volumes, but correlate with cardiac auscultation in determining whether further cardiac workup is warranted.   Electronically Signed   By: Gaylyn Rong M.D.   On: 12/26/2014 18:03     EKG Interpretation None      MDM Pt has elevated wbc's of 20.7.  Urine sows many bacteria wbc's tntc.   Ct scans shows bilat hydronephrosis  consistent with pyelonephritis   Pt given Iv fluids and    Final diagnoses:  Abdominal pain  Pyelonephritis    Hospitalist consult,  Dr. York Ram will admit.     Lonia Sk[place], PA-C 12/26/14 2303  Elwin Mocha, MD 12/26/14 [phone]

## 2014-12-26 NOTE — ED Notes (Signed)
PA Sofia at bedside. 

## 2014-12-26 NOTE — ED Notes (Signed)
This nurse walked into patient's room. Patients BP was ready 70's advised patient lay on her back to get an accurate BP. Patient states, " it not going to work this the only way make me feel better". Patient mother states, " Her BP was been low" " she does not need to roll over. Patient advised the BP results were low and this nurse, Leeroy Bock RN and EMT taylor wanted to make sure the ready was correct.

## 2014-12-26 NOTE — ED Notes (Signed)
Bolus hung per verbal order PA.

## 2014-12-27 ENCOUNTER — Inpatient Hospital Stay (HOSPITAL_COMMUNITY): Payer: Medicaid Other

## 2014-12-27 DIAGNOSIS — F329 Major depressive disorder, single episode, unspecified: Secondary | ICD-10-CM

## 2014-12-27 DIAGNOSIS — R6520 Severe sepsis without septic shock: Secondary | ICD-10-CM

## 2014-12-27 DIAGNOSIS — R109 Unspecified abdominal pain: Secondary | ICD-10-CM

## 2014-12-27 DIAGNOSIS — A419 Sepsis, unspecified organism: Secondary | ICD-10-CM | POA: Diagnosis present

## 2014-12-27 LAB — BLOOD GAS, ARTERIAL
Acid-base deficit: 9.1 mmol/L — ABNORMAL HIGH (ref 0.0–2.0)
Bicarbonate: 16 mEq/L — ABNORMAL LOW (ref 20.0–24.0)
DRAWN BY: 44138
O2 CONTENT: 6 L/min
O2 Saturation: 85.8 %
PH ART: 7.308 — AB (ref 7.350–7.450)
Patient temperature: 98.6
TCO2: 17 mmol/L (ref 0–100)
pCO2 arterial: 32.8 mmHg — ABNORMAL LOW (ref 35.0–45.0)
pO2, Arterial: 56.1 mmHg — ABNORMAL LOW (ref 80.0–100.0)

## 2014-12-27 LAB — BASIC METABOLIC PANEL
ANION GAP: 9 (ref 5–15)
BUN: 15 mg/dL (ref 6–20)
CALCIUM: 7.1 mg/dL — AB (ref 8.9–10.3)
CHLORIDE: 110 mmol/L (ref 101–111)
CO2: 18 mmol/L — ABNORMAL LOW (ref 22–32)
Creatinine, Ser: 2.04 mg/dL — ABNORMAL HIGH (ref 0.44–1.00)
GFR calc Af Amer: 39 mL/min — ABNORMAL LOW (ref >60)
GFR, EST NON AFRICAN AMERICAN: 34 mL/min — AB (ref >60)
Glucose, Bld: 221 mg/dL — ABNORMAL HIGH (ref 65–99)
Potassium: 3.6 mmol/L (ref 3.5–5.1)
Sodium: 137 mmol/L (ref 135–145)

## 2014-12-27 LAB — GLUCOSE, CAPILLARY
GLUCOSE-CAPILLARY: 119 mg/dL — AB (ref 65–99)
Glucose-Capillary: 192 mg/dL — ABNORMAL HIGH (ref 65–99)
Glucose-Capillary: 171 mg/dL — ABNORMAL HIGH (ref 65–99)

## 2014-12-27 LAB — CBC
HEMATOCRIT: 26.9 % — AB (ref 36.0–46.0)
Hemoglobin: 9.2 g/dL — ABNORMAL LOW (ref 12.0–15.0)
MCH: 26.6 pg (ref 26.0–34.0)
MCHC: 34.2 g/dL (ref 30.0–36.0)
MCV: 77.7 fL — ABNORMAL LOW (ref 78.0–100.0)
Platelets: 268 10*3/uL (ref 150–400)
RBC: 3.46 MIL/uL — ABNORMAL LOW (ref 3.87–5.11)
RDW: 15 % (ref 11.5–15.5)
WBC: 24.4 10*3/uL — ABNORMAL HIGH (ref 4.0–10.5)

## 2014-12-27 LAB — GC/CHLAMYDIA PROBE AMP (~~LOC~~) NOT AT ARMC
CHLAMYDIA, DNA PROBE: NEGATIVE
NEISSERIA GONORRHEA: NEGATIVE

## 2014-12-27 LAB — MRSA PCR SCREENING: MRSA BY PCR: NEGATIVE

## 2014-12-27 MED ORDER — INSULIN ASPART 100 UNIT/ML ~~LOC~~ SOLN
0.0000 [IU] | Freq: Three times a day (TID) | SUBCUTANEOUS | Status: DC
  Administered 2014-12-27 (×2): 2 [IU] via SUBCUTANEOUS
  Administered 2014-12-28: 1 [IU] via SUBCUTANEOUS
  Administered 2014-12-28: 2 [IU] via SUBCUTANEOUS
  Administered 2014-12-29: 7 [IU] via SUBCUTANEOUS

## 2014-12-27 MED ORDER — SODIUM CHLORIDE 0.9 % IV BOLUS (SEPSIS)
500.0000 mL | Freq: Once | INTRAVENOUS | Status: AC
  Administered 2014-12-27: 500 mL via INTRAVENOUS

## 2014-12-27 MED ORDER — FENTANYL CITRATE (PF) 100 MCG/2ML IJ SOLN
25.0000 ug | INTRAMUSCULAR | Status: DC | PRN
  Filled 2014-12-27: qty 2

## 2014-12-27 MED ORDER — ONDANSETRON HCL 4 MG/2ML IJ SOLN
4.0000 mg | Freq: Four times a day (QID) | INTRAMUSCULAR | Status: DC | PRN
  Administered 2014-12-27: 4 mg via INTRAVENOUS
  Filled 2014-12-27: qty 2

## 2014-12-27 MED ORDER — KETOROLAC TROMETHAMINE 30 MG/ML IJ SOLN: 30.0000 mg | Freq: Four times a day (QID) | INTRAMUSCULAR | Status: DC

## 2014-12-27 MED ORDER — ONDANSETRON HCL 4 MG PO TABS: 4.0000 mg | ORAL_TABLET | Freq: Four times a day (QID) | ORAL | Status: DC | PRN

## 2014-12-27 MED ORDER — PRENATAL MULTIVITAMIN CH
1.0000 | ORAL_TABLET | Freq: Every day | ORAL | Status: DC
  Administered 2014-12-27 – 2014-12-31 (×5): 1 via ORAL
  Filled 2014-12-27 (×5): qty 1

## 2014-12-27 MED ORDER — OXYCODONE HCL 5 MG PO TABS
5.0000 mg | ORAL_TABLET | ORAL | Status: DC | PRN
  Administered 2014-12-27: 5 mg via ORAL
  Administered 2014-12-27: 10 mg via ORAL
  Filled 2014-12-27: qty 1
  Filled 2014-12-27: qty 2

## 2014-12-27 MED ORDER — ENOXAPARIN SODIUM 40 MG/0.4ML ~~LOC~~ SOLN
40.0000 mg | SUBCUTANEOUS | Status: DC
  Filled 2014-12-27 (×2): qty 0.4

## 2014-12-27 MED ORDER — ALUM & MAG HYDROXIDE-SIMETH 200-200-20 MG/5ML PO SUSP: 30.0000 mL | Freq: Four times a day (QID) | ORAL | Status: DC | PRN

## 2014-12-27 MED ORDER — LIVING WELL WITH DIABETES BOOK
Freq: Once | Status: AC
  Administered 2014-12-27: 12:00:00
  Filled 2014-12-27: qty 1

## 2014-12-27 MED ORDER — OXYCODONE HCL 5 MG PO TABS
5.0000 mg | ORAL_TABLET | ORAL | Status: DC | PRN
  Administered 2014-12-27: 5 mg via ORAL
  Filled 2014-12-27: qty 1

## 2014-12-27 MED ORDER — ACETAMINOPHEN 650 MG RE SUPP: 650.0000 mg | Freq: Four times a day (QID) | RECTAL | Status: DC | PRN

## 2014-12-27 MED ORDER — PIPERACILLIN-TAZOBACTAM 3.375 G IVPB
3.3750 g | Freq: Three times a day (TID) | INTRAVENOUS | Status: DC
  Administered 2014-12-27 – 2014-12-30 (×8): 3.375 g via INTRAVENOUS
  Filled 2014-12-27 (×12): qty 50

## 2014-12-27 MED ORDER — SODIUM CHLORIDE 0.9 % IV SOLN
INTRAVENOUS | Status: DC
  Administered 2014-12-27: 200 mL/h via INTRAVENOUS
  Administered 2014-12-27 (×2): via INTRAVENOUS

## 2014-12-27 MED ORDER — ALBUTEROL SULFATE (2.5 MG/3ML) 0.083% IN NEBU
2.5000 mg | INHALATION_SOLUTION | Freq: Four times a day (QID) | RESPIRATORY_TRACT | Status: DC | PRN
  Administered 2014-12-27: 2.5 mg via RESPIRATORY_TRACT
  Filled 2014-12-27: qty 3

## 2014-12-27 MED ORDER — VANCOMYCIN HCL 10 G IV SOLR
2000.0000 mg | Freq: Once | INTRAVENOUS | Status: AC
  Administered 2014-12-27: 2000 mg via INTRAVENOUS
  Filled 2014-12-27: qty 2000

## 2014-12-27 MED ORDER — VANCOMYCIN HCL 10 G IV SOLR
1250.0000 mg | INTRAVENOUS | Status: DC
  Administered 2014-12-28 – 2014-12-30 (×3): 1250 mg via INTRAVENOUS
  Filled 2014-12-27 (×3): qty 1250

## 2014-12-27 MED ORDER — SERTRALINE HCL 50 MG PO TABS
50.0000 mg | ORAL_TABLET | Freq: Every day | ORAL | Status: DC
  Administered 2014-12-27 – 2014-12-29 (×2): 50 mg via ORAL
  Filled 2014-12-27 (×3): qty 1

## 2014-12-27 MED ORDER — ACETAMINOPHEN 325 MG PO TABS
650.0000 mg | ORAL_TABLET | Freq: Four times a day (QID) | ORAL | Status: DC | PRN
  Filled 2014-12-27: qty 2

## 2014-12-27 MED ORDER — HYDROMORPHONE HCL 1 MG/ML IJ SOLN: 0.5000 mg | INTRAMUSCULAR | Status: DC | PRN

## 2014-12-27 NOTE — Progress Notes (Signed)
Felicia Holt IBB:[id number] DOB: Nov 02, 1995 DOA: 12/26/2014 PCP: Pcp Not In System  Brief narrative: 19 y/o ? h/o Gest DM prior H/o HELLP syndrome + severe pre-eclampsia, otherwise bland PMH admitted 6/26 c Pyelonephritis CT scan suggestive Fatthy liver and edematous GB  Past medical history-As per Problem list Chart reviewed as below- reviewed  Consultants:  none  Procedures:  none  Antibiotics:  Zosyn  ceftriazone   Vanc   Subjective   Feels prroly No apeptie Abd pain severe Some n No vomti Nursing reports cbg's ?      Objective    Interim History:   Telemetry: Sinus tach   Objective: Filed Vitals:   12/27/14 0100 12/27/14 0300 12/27/14 0400 12/27/14 0711  BP: 98/73 86/43 89/44  88/56  Pulse: 118 116 115 110  Temp: 98 F (36.7 C) 98.6 F (37 C)  98 F (36.7 C)  TempSrc: Oral Oral  Oral  Resp:  29 0 13  Height: 5' 2"  (1.575 m)     Weight: 105.6 kg (232 lb 12.9 oz)     SpO2: 100% 100% 99% 100%    Intake/Output Summary (Last 24 hours) at 12/27/14 0810 Last data filed at 12/27/14 0700  Gross per 24 hour  Intake 1740.83 ml  Output      0 ml  Net 1740.83 ml    Exam:  General:  eomi tired and ill appearing Cardiovascular: s1 s 2no m/r/g Respiratory: clear  Abdomen:  Tender generalized, no RUQ tender Skinno le ledema Neuro intact but sleepy  Data Reviewed: Basic Metabolic Panel:  Recent Labs Lab 12/26/14 1629 12/27/14 0240  NA 135 137  K 3.2* 3.6  CL 101 110  CO2 23 18*  GLUCOSE 225* 221*  BUN 15 15  CREATININE 1.91* 2.04*  CALCIUM 8.3* 7.1*   Liver Function Tests:  Recent Labs Lab 12/26/14 1629  AST 25  ALT 29  ALKPHOS 149*  BILITOT 1.4*  PROT 7.2  ALBUMIN 2.8*   No results for input(s): LIPASE, AMYLASE in the last 168 hours. No results for input(s): AMMONIA in the last 168 hours. CBC:  Recent Labs Lab 12/26/14 1629 12/27/14 0240  WBC 20.7* 24.4*  NEUTROABS 18.2*  --   HGB 11.6* 9.2*  HCT 34.5* 26.9*    MCV 77.7* 77.7*  PLT 278 268   Cardiac Enzymes: No results for input(s): CKTOTAL, CKMB, CKMBINDEX, TROPONINI in the last 168 hours. BNP: Invalid input(s): POCBNP CBG: No results for input(s): GLUCAP in the last 168 hours.  Recent Results (from the past 240 hour(s))  MRSA PCR Screening     Status: None   Collection Time: 12/27/14  1:44 AM  Result Value Ref Range Status   MRSA by PCR NEGATIVE NEGATIVE Final    Comment:        The GeneXpert MRSA Assay (FDA approved for NASAL specimens only), is one component of a comprehensive MRSA colonization surveillance program. It is not intended to diagnose MRSA infection nor to guide or monitor treatment for MRSA infections.      Studies:              All Imaging reviewed and is as per above notation   Scheduled Meds: . enoxaparin (LOVENOX) injection  40 mg Subcutaneous Q24H  . prenatal multivitamin  1 tablet Oral Q1200  . sertraline  50 mg Oral Daily  . [START ON 12/28/2014] vancomycin  1,250 mg Intravenous Q24H   Continuous Infusions: . sodium chloride 125 mL/hr at 12/27/14 0202  Assessment/Plan:  Principal Problem:   Sepsis  likely etiology pyelonephritis.  Monitor clincially.  If no improvement by pm, would consider dedicated US abd pelvis r/o Cholecystitis given her habitus and CT findings of edema of GB-this is considered less likely although alk phos was marginally ? onj admission to 149 Get PCT algo Cycle lactic acid Clear liquids CBC + diff in am AKI-GFR 39-likely 2/2 to copious n/v.  Saline ? 200 cc/hr Would  pain managemwent to Toradol scheduled for now with prn opiates po Probable DM ty 2-has h/o LGA baby and Gest DM-Would give SSI coverage.   Consider Amaryl on d/c.  Get A1c HTn-hold HCTZ gicven AKI on admission   Depression-continue sertraline  Morbid obesity, Body mass index is 42.57 kg/(m^2). -OP counselling needed    Verneita Griffes, MD  [facility] Pager [phone] 12/27/2014, 8:10 AM    LOS:  1 day

## 2014-12-27 NOTE — Progress Notes (Signed)
ANTIBIOTIC CONSULT NOTE - INITIAL  Pharmacy Consult for Vancomycin/Zosyn  Indication: rule out sepsis, ?intra-abdominal infection, pyelonephritis  No Known Allergies  Patient Measurements: Height: 5\' 2"  (157.5 cm) Weight: 232 lb 12.9 oz (105.6 kg) IBW/kg (Calculated) : 50.1  Vital Signs: Temp: 98 F (36.7 C) (06/27 0100) Temp Source: Oral (06/27 0100) BP: 98/73 mmHg (06/27 0100) Pulse Rate: 118 (06/27 0100)  Labs:  Recent Labs  12/26/14 1629  WBC 20.7*  HGB 11.6*  PLT 278  CREATININE 1.91*   Estimated Creatinine Clearance: 53.2 mL/min (by C-G formula based on Cr of 1.91).  Medical History: Past Medical History  Diagnosis Date  . Gestational diabetes   . GERD (gastroesophageal reflux disease)   . Morbid obesity    Assessment: 19 y/o F with abdominal/back pain, CT abdomen with bilateral pyelonephritis/gallbladder edema, WBC elevated, lactic acid elevated, acute renal failure.   Goal of Therapy:  Vancomycin trough level 15-20 mcg/ml  Plan:  -Vancomycin 2000 mg IV x 1, then 1250 mg IV q24h (will need dose increase as acute renal failure resolves) -Zosyn 3.375G IV q8h to be infused over 4 hours -Trend WBC, temp, renal function  -Drug levels as indicated   Abran Duke 12/27/2014,2:00 AM

## 2014-12-27 NOTE — Care Management Note (Signed)
Case Management Note  Patient Details  Name: Felicia ChristenJanai Holt MRN: [id number] Date of Birth: 06-Oct-1995  Subjective/Objective:      Adm w sepsis              Action/Plan: lives w fam   Expected Discharge Date:                  Expected Discharge Plan:     In-House Referral:     Discharge planning Services     Post Acute Care Choice:    Choice offered to:     DME Arranged:    DME Agency:     HH Arranged:    HH Agency:     Status of Service:     Medicare Important Message Given:    Date Medicare IM Given:    Medicare IM give by:    Date Additional Medicare IM Given:    Additional Medicare Important Message give by:     If discussed at Long Length of Stay Meetings, dates discussed:    Additional Comments: ur review done.  Hanley Haysowell, Haden Suder T, RN 12/27/2014, 10:27 AM

## 2014-12-27 NOTE — Progress Notes (Addendum)
Pt refused lab stick and Lovenox injection. Explained to pt need for medication and lab results. MD notified

## 2014-12-28 ENCOUNTER — Inpatient Hospital Stay (HOSPITAL_COMMUNITY): Payer: Medicaid Other

## 2014-12-28 ENCOUNTER — Ambulatory Visit (HOSPITAL_COMMUNITY): Payer: Medicaid Other

## 2014-12-28 DIAGNOSIS — R0600 Dyspnea, unspecified: Secondary | ICD-10-CM

## 2014-12-28 DIAGNOSIS — J80 Acute respiratory distress syndrome: Secondary | ICD-10-CM

## 2014-12-28 DIAGNOSIS — M7989 Other specified soft tissue disorders: Secondary | ICD-10-CM

## 2014-12-28 DIAGNOSIS — N179 Acute kidney failure, unspecified: Secondary | ICD-10-CM

## 2014-12-28 DIAGNOSIS — N289 Disorder of kidney and ureter, unspecified: Secondary | ICD-10-CM

## 2014-12-28 DIAGNOSIS — R6520 Severe sepsis without septic shock: Secondary | ICD-10-CM

## 2014-12-28 DIAGNOSIS — R1011 Right upper quadrant pain: Secondary | ICD-10-CM

## 2014-12-28 DIAGNOSIS — A419 Sepsis, unspecified organism: Principal | ICD-10-CM

## 2014-12-28 DIAGNOSIS — N12 Tubulo-interstitial nephritis, not specified as acute or chronic: Secondary | ICD-10-CM

## 2014-12-28 LAB — GLUCOSE, CAPILLARY
GLUCOSE-CAPILLARY: 154 mg/dL — AB (ref 65–99)
GLUCOSE-CAPILLARY: 135 mg/dL — AB (ref 65–99)
GLUCOSE-CAPILLARY: 145 mg/dL — AB (ref 65–99)
GLUCOSE-CAPILLARY: 203 mg/dL — AB (ref 65–99)
Glucose-Capillary: 151 mg/dL — ABNORMAL HIGH (ref 65–99)

## 2014-12-28 LAB — BLOOD GAS, ARTERIAL
ACID-BASE DEFICIT: 8.4 mmol/L — AB (ref 0.0–2.0)
ACID-BASE DEFICIT: 8.1 mmol/L — AB (ref 0.0–2.0)
Acid-base deficit: 8.7 mmol/L — ABNORMAL HIGH (ref 0.0–2.0)
Acid-base deficit: 3.7 mmol/L — ABNORMAL HIGH (ref 0.0–2.0)
BICARBONATE: 17.1 meq/L — AB (ref 20.0–24.0)
BICARBONATE: 17.9 meq/L — AB (ref 20.0–24.0)
BICARBONATE: 20 meq/L (ref 20.0–24.0)
Bicarbonate: 21.6 mEq/L (ref 20.0–24.0)
DELIVERY SYSTEMS: POSITIVE
Drawn by: 44138
Drawn by: 44138
Drawn by: 39899
Expiratory PAP: 5
FIO2: 0.8 %
FIO2: 1 %
FIO2: 0.7 %
FIO2: 0.7 %
Inspiratory PAP: 10
LHR: 35 {breaths}/min
MECHVT: 300 mL
MECHVT: 350 mL
Mode: POSITIVE
O2 SAT: 91.4 %
O2 SAT: 99.3 %
O2 Saturation: 93 %
O2 Saturation: 96 %
PATIENT TEMPERATURE: 98.6
PCO2 ART: 44.8 mmHg (ref 35.0–45.0)
PEEP/CPAP: 16 cmH2O
PEEP: 12 cmH2O
PEEP: 12 cmH2O
PH ART: 7.198 — AB (ref 7.350–7.450)
PH ART: 7.111 — AB (ref 7.350–7.450)
PH ART: 7.305 — AB (ref 7.350–7.450)
PO2 ART: 94.4 mmHg (ref 80.0–100.0)
Patient temperature: 98.6
Patient temperature: 98.6
Patient temperature: 98.5
RATE: 12 resp/min
RATE: 30 resp/min
RATE: 35 resp/min
TCO2: 18.3 mmol/L (ref 0–100)
TCO2: 19.4 mmol/L (ref 0–100)
TCO2: 22 mmol/L (ref 0–100)
TCO2: 23 mmol/L (ref 0–100)
VT: 0.3 mL
pCO2 arterial: 37.9 mmHg (ref 35.0–45.0)
pCO2 arterial: 47.9 mmHg — ABNORMAL HIGH (ref 35.0–45.0)
pCO2 arterial: 65.5 mmHg (ref 35.0–45.0)
pH, Arterial: 7.277 — ABNORMAL LOW (ref 7.350–7.450)
pO2, Arterial: 75.2 mmHg — ABNORMAL LOW (ref 80.0–100.0)
pO2, Arterial: 69.6 mmHg — ABNORMAL LOW (ref 80.0–100.0)
pO2, Arterial: 165 mmHg — ABNORMAL HIGH (ref 80.0–100.0)

## 2014-12-28 LAB — APTT: APTT: 37 s (ref 24–37)

## 2014-12-28 LAB — COMPREHENSIVE METABOLIC PANEL
ALT: 18 U/L (ref 14–54)
ALT: 18 U/L (ref 14–54)
ANION GAP: 9 (ref 5–15)
AST: 15 U/L (ref 15–41)
AST: 14 U/L — AB (ref 15–41)
Albumin: 1.9 g/dL — ABNORMAL LOW (ref 3.5–5.0)
Albumin: 1.7 g/dL — ABNORMAL LOW (ref 3.5–5.0)
Alkaline Phosphatase: 129 U/L — ABNORMAL HIGH (ref 38–126)
Alkaline Phosphatase: 107 U/L (ref 38–126)
Anion gap: 11 (ref 5–15)
BUN: 18 mg/dL (ref 6–20)
BUN: 20 mg/dL (ref 6–20)
CO2: 17 mmol/L — AB (ref 22–32)
CO2: 21 mmol/L — AB (ref 22–32)
Calcium: 6.9 mg/dL — ABNORMAL LOW (ref 8.9–10.3)
Calcium: 7.2 mg/dL — ABNORMAL LOW (ref 8.9–10.3)
Chloride: 110 mmol/L (ref 101–111)
Chloride: 111 mmol/L (ref 101–111)
Creatinine, Ser: 2.06 mg/dL — ABNORMAL HIGH (ref 0.44–1.00)
Creatinine, Ser: 2.23 mg/dL — ABNORMAL HIGH (ref 0.44–1.00)
GFR calc Af Amer: 35 mL/min — ABNORMAL LOW (ref >60)
GFR calc non Af Amer: 30 mL/min — ABNORMAL LOW (ref >60)
GFR, EST AFRICAN AMERICAN: 39 mL/min — AB (ref >60)
GFR, EST NON AFRICAN AMERICAN: 33 mL/min — AB (ref >60)
GLUCOSE: 124 mg/dL — AB (ref 65–99)
Glucose, Bld: 144 mg/dL — ABNORMAL HIGH (ref 65–99)
Potassium: 3.2 mmol/L — ABNORMAL LOW (ref 3.5–5.1)
Potassium: 3.9 mmol/L (ref 3.5–5.1)
SODIUM: 138 mmol/L (ref 135–145)
Sodium: 141 mmol/L (ref 135–145)
TOTAL PROTEIN: 5.9 g/dL — AB (ref 6.5–8.1)
Total Bilirubin: 1.3 mg/dL — ABNORMAL HIGH (ref 0.3–1.2)
Total Bilirubin: 1.4 mg/dL — ABNORMAL HIGH (ref 0.3–1.2)
Total Protein: 6 g/dL — ABNORMAL LOW (ref 6.5–8.1)

## 2014-12-28 LAB — CBC
HCT: 29.1 % — ABNORMAL LOW (ref 36.0–46.0)
HEMOGLOBIN: 9.7 g/dL — AB (ref 12.0–15.0)
MCH: 26.7 pg (ref 26.0–34.0)
MCHC: 33.3 g/dL (ref 30.0–36.0)
MCV: 80.2 fL (ref 78.0–100.0)
Platelets: 294 10*3/uL (ref 150–400)
RBC: 3.63 MIL/uL — ABNORMAL LOW (ref 3.87–5.11)
RDW: 15.8 % — AB (ref 11.5–15.5)
WBC: 27.7 10*3/uL — ABNORMAL HIGH (ref 4.0–10.5)

## 2014-12-28 LAB — BASIC METABOLIC PANEL
ANION GAP: 6 (ref 5–15)
BUN: 19 mg/dL (ref 6–20)
CALCIUM: 6.7 mg/dL — AB (ref 8.9–10.3)
CO2: 25 mmol/L (ref 22–32)
Chloride: 112 mmol/L — ABNORMAL HIGH (ref 101–111)
Creatinine, Ser: 2.08 mg/dL — ABNORMAL HIGH (ref 0.44–1.00)
GFR calc non Af Amer: 33 mL/min — ABNORMAL LOW (ref >60)
GFR, EST AFRICAN AMERICAN: 38 mL/min — AB (ref >60)
Glucose, Bld: 161 mg/dL — ABNORMAL HIGH (ref 65–99)
Potassium: 4.1 mmol/L (ref 3.5–5.1)
SODIUM: 143 mmol/L (ref 135–145)

## 2014-12-28 LAB — TYPE AND SCREEN
ABO/RH(D): AB POS
Antibody Screen: NEGATIVE

## 2014-12-28 LAB — CBC WITH DIFFERENTIAL/PLATELET
BASOS PCT: 0 % (ref 0–1)
Basophils Absolute: 0 10*3/uL (ref 0.0–0.1)
Eosinophils Absolute: 0 10*3/uL (ref 0.0–0.7)
Eosinophils Relative: 0 % (ref 0–5)
HEMATOCRIT: 29.2 % — AB (ref 36.0–46.0)
HEMOGLOBIN: 9.9 g/dL — AB (ref 12.0–15.0)
LYMPHS PCT: 6 % — AB (ref 12–46)
Lymphs Abs: 1.8 10*3/uL (ref 0.7–4.0)
MCH: 26.1 pg (ref 26.0–34.0)
MCHC: 33.9 g/dL (ref 30.0–36.0)
MCV: 76.8 fL — ABNORMAL LOW (ref 78.0–100.0)
MONOS PCT: 4 % (ref 3–12)
Monocytes Absolute: 1.2 10*3/uL — ABNORMAL HIGH (ref 0.1–1.0)
NEUTROS ABS: 26.4 10*3/uL — AB (ref 1.7–7.7)
Neutrophils Relative %: 90 % — ABNORMAL HIGH (ref 43–77)
Platelets: 322 10*3/uL (ref 150–400)
RBC: 3.8 MIL/uL — AB (ref 3.87–5.11)
RDW: 15.2 % (ref 11.5–15.5)
WBC: 29.4 10*3/uL — ABNORMAL HIGH (ref 4.0–10.5)

## 2014-12-28 LAB — FIBRINOGEN

## 2014-12-28 LAB — TROPONIN I: TROPONIN I: 0.09 ng/mL — AB (ref <0.031)

## 2014-12-28 LAB — PROTIME-INR
INR: 1.31 (ref 0.00–1.49)
INR: 1.3 (ref 0.00–1.49)
Prothrombin Time: 16.4 seconds — ABNORMAL HIGH (ref 11.6–15.2)
Prothrombin Time: 16.3 seconds — ABNORMAL HIGH (ref 11.6–15.2)

## 2014-12-28 LAB — ABO/RH: ABO/RH(D): AB POS

## 2014-12-28 LAB — CARBOXYHEMOGLOBIN
CARBOXYHEMOGLOBIN: 0.7 % (ref 0.5–1.5)
Methemoglobin: 1.5 % (ref 0.0–1.5)
O2 Saturation: 87.1 %
TOTAL HEMOGLOBIN: 12 g/dL (ref 12.0–16.0)

## 2014-12-28 LAB — LACTIC ACID, PLASMA
Lactic Acid, Venous: 1 mmol/L (ref 0.5–2.0)
Lactic Acid, Venous: 0.7 mmol/L (ref 0.5–2.0)

## 2014-12-28 LAB — D-DIMER, QUANTITATIVE (NOT AT ARMC): D-Dimer, Quant: 9.58 ug/mL-FEU — ABNORMAL HIGH (ref 0.00–0.48)

## 2014-12-28 LAB — TSH: TSH: 0.558 u[IU]/mL (ref 0.350–4.500)

## 2014-12-28 LAB — AMYLASE: AMYLASE: 29 U/L (ref 28–100)

## 2014-12-28 LAB — LIPASE, BLOOD: Lipase: <10 U/L — ABNORMAL LOW (ref 22–51)

## 2014-12-28 LAB — CORTISOL: Cortisol, Plasma: 16.4 ug/dL

## 2014-12-28 LAB — PROCALCITONIN: Procalcitonin: 2.18 ng/mL

## 2014-12-28 MED ORDER — VITAL HIGH PROTEIN PO LIQD
1000.0000 mL | ORAL | Status: DC
  Filled 2014-12-28 (×2): qty 1000

## 2014-12-28 MED ORDER — SODIUM CHLORIDE 0.9 % IV SOLN: 3.0000 ug/kg/min | INTRAVENOUS | Status: DC

## 2014-12-28 MED ORDER — FUROSEMIDE 10 MG/ML IJ SOLN
40.0000 mg | Freq: Once | INTRAMUSCULAR | Status: AC
  Administered 2014-12-28: 40 mg via INTRAVENOUS

## 2014-12-28 MED ORDER — POTASSIUM CHLORIDE 10 MEQ/100ML IV SOLN
10.0000 meq | INTRAVENOUS | Status: AC
  Administered 2014-12-28 (×2): 10 meq via INTRAVENOUS
  Filled 2014-12-28 (×2): qty 100

## 2014-12-28 MED ORDER — CISATRACURIUM BOLUS VIA INFUSION
0.0500 mg/kg | Freq: Once | INTRAVENOUS | Status: DC
Start: 2014-12-28 — End: 2014-12-28

## 2014-12-28 MED ORDER — NOREPINEPHRINE BITARTRATE 1 MG/ML IV SOLN
5.0000 ug/min | INTRAVENOUS | Status: DC
  Administered 2014-12-28: 5 ug/min via INTRAVENOUS
  Filled 2014-12-28: qty 4

## 2014-12-28 MED ORDER — MIDAZOLAM HCL 2 MG/2ML IJ SOLN: 2.0000 mg | Freq: Once | INTRAMUSCULAR | Status: DC

## 2014-12-28 MED ORDER — DEXTROSE 5 % IV SOLN
INTRAVENOUS | Status: DC
  Administered 2014-12-28 – 2014-12-29 (×2): via INTRAVENOUS
  Filled 2014-12-28 (×4): qty 150

## 2014-12-28 MED ORDER — SODIUM CHLORIDE 0.9 % IV BOLUS (SEPSIS): 500.0000 mL | INTRAVENOUS | Status: AC

## 2014-12-28 MED ORDER — FENTANYL CITRATE (PF) 100 MCG/2ML IJ SOLN
100.0000 ug | Freq: Once | INTRAMUSCULAR | Status: AC
  Administered 2014-12-28: 100 ug via INTRAVENOUS

## 2014-12-28 MED ORDER — MIDAZOLAM HCL 2 MG/2ML IJ SOLN
INTRAMUSCULAR | Status: AC
  Administered 2014-12-28: 2 mg
  Filled 2014-12-28: qty 2

## 2014-12-28 MED ORDER — MIDAZOLAM HCL 2 MG/2ML IJ SOLN: 2.0000 mg | Freq: Once | INTRAMUSCULAR | Status: AC | PRN

## 2014-12-28 MED ORDER — FENTANYL CITRATE (PF) 100 MCG/2ML IJ SOLN
100.0000 ug | Freq: Once | INTRAMUSCULAR | Status: AC | PRN
  Administered 2014-12-28: 100 ug via INTRAVENOUS

## 2014-12-28 MED ORDER — ETOMIDATE 2 MG/ML IV SOLN
INTRAVENOUS | Status: AC
  Administered 2014-12-28: 40 mg
  Filled 2014-12-28: qty 20

## 2014-12-28 MED ORDER — SODIUM CHLORIDE 0.9 % IV SOLN
1.0000 g | Freq: Once | INTRAVENOUS | Status: AC
  Administered 2014-12-28: 1 g via INTRAVENOUS
  Filled 2014-12-28: qty 10

## 2014-12-28 MED ORDER — ARTIFICIAL TEARS OP OINT
1.0000 "application " | TOPICAL_OINTMENT | Freq: Three times a day (TID) | OPHTHALMIC | Status: DC
  Administered 2014-12-28 – 2014-12-29 (×4): 1 via OPHTHALMIC
  Filled 2014-12-28: qty 3.5

## 2014-12-28 MED ORDER — SODIUM CHLORIDE 0.9 % IV SOLN
25.0000 ug/h | INTRAVENOUS | Status: DC
  Administered 2014-12-28: 200 ug/h via INTRAVENOUS
  Administered 2014-12-29: 300 ug/h via INTRAVENOUS
  Administered 2014-12-30: 170 ug/h via INTRAVENOUS
  Administered 2014-12-30: 50 ug/h via INTRAVENOUS
  Filled 2014-12-28 (×7): qty 50

## 2014-12-28 MED ORDER — SODIUM BICARBONATE 8.4 % IV SOLN
INTRAVENOUS | Status: AC
  Filled 2014-12-28: qty 50

## 2014-12-28 MED ORDER — SODIUM CHLORIDE 0.9 % IV SOLN
25.0000 ug/h | INTRAVENOUS | Status: DC
  Administered 2014-12-28: 100 ug/h via INTRAVENOUS
  Filled 2014-12-28: qty 50

## 2014-12-28 MED ORDER — CISATRACURIUM BOLUS VIA INFUSION
10.0000 mg | Freq: Once | INTRAVENOUS | Status: AC
  Administered 2014-12-28: 10 mg via INTRAVENOUS
  Filled 2014-12-28: qty 10

## 2014-12-28 MED ORDER — SUCCINYLCHOLINE CHLORIDE 20 MG/ML IJ SOLN
INTRAMUSCULAR | Status: AC
  Filled 2014-12-28: qty 1

## 2014-12-28 MED ORDER — FENTANYL BOLUS VIA INFUSION
50.0000 ug | INTRAVENOUS | Status: DC | PRN
  Administered 2014-12-30: 50 ug via INTRAVENOUS
  Filled 2014-12-28: qty 50

## 2014-12-28 MED ORDER — FENTANYL CITRATE (PF) 100 MCG/2ML IJ SOLN
100.0000 ug | Freq: Once | INTRAMUSCULAR | Status: DC
Start: 2014-12-28 — End: 2014-12-28

## 2014-12-28 MED ORDER — SODIUM CHLORIDE 0.9 % IV BOLUS (SEPSIS): 1000.0000 mL | INTRAVENOUS | Status: AC

## 2014-12-28 MED ORDER — LIDOCAINE HCL (CARDIAC) 20 MG/ML IV SOLN
INTRAVENOUS | Status: AC
  Filled 2014-12-28: qty 5

## 2014-12-28 MED ORDER — FENTANYL BOLUS VIA INFUSION
50.0000 ug | INTRAVENOUS | Status: DC | PRN
  Filled 2014-12-28: qty 50

## 2014-12-28 MED ORDER — PROPOFOL 1000 MG/100ML IV EMUL
5.0000 ug/kg/min | INTRAVENOUS | Status: DC
  Administered 2014-12-28: 50 ug/kg/min via INTRAVENOUS
  Filled 2014-12-28: qty 100

## 2014-12-28 MED ORDER — SODIUM CHLORIDE 0.9 % IV SOLN
INTRAVENOUS | Status: DC
  Administered 2014-12-28 – 2015-01-04 (×4): via INTRAVENOUS
  Administered 2015-01-07: 10 mL/h via INTRAVENOUS

## 2014-12-28 MED ORDER — ROCURONIUM BROMIDE 50 MG/5ML IV SOLN
INTRAVENOUS | Status: AC
  Filled 2014-12-28: qty 2

## 2014-12-28 MED ORDER — FENTANYL CITRATE (PF) 100 MCG/2ML IJ SOLN: 100.0000 ug | Freq: Once | INTRAMUSCULAR | Status: DC | PRN

## 2014-12-28 MED ORDER — POTASSIUM CHLORIDE 10 MEQ/100ML IV SOLN
10.0000 meq | INTRAVENOUS | Status: AC
  Administered 2014-12-28: 10 meq via INTRAVENOUS

## 2014-12-28 MED ORDER — ARTIFICIAL TEARS OP OINT: 1.0000 "application " | TOPICAL_OINTMENT | Freq: Three times a day (TID) | OPHTHALMIC | Status: DC

## 2014-12-28 MED ORDER — PANTOPRAZOLE SODIUM 40 MG PO PACK
40.0000 mg | PACK | Freq: Every day | ORAL | Status: DC
  Administered 2014-12-28 – 2014-12-30 (×3): 40 mg
  Filled 2014-12-28 (×3): qty 20

## 2014-12-28 MED ORDER — VITAL HIGH PROTEIN PO LIQD
1000.0000 mL | ORAL | Status: DC
  Administered 2014-12-28 – 2015-01-04 (×11): 1000 mL
  Filled 2014-12-28 (×11): qty 1000

## 2014-12-28 MED ORDER — HYDROCORTISONE NA SUCCINATE PF 100 MG IJ SOLR
50.0000 mg | Freq: Four times a day (QID) | INTRAMUSCULAR | Status: DC
  Administered 2014-12-28 – 2014-12-29 (×4): 50 mg via INTRAVENOUS
  Filled 2014-12-28 (×8): qty 1

## 2014-12-28 MED ORDER — FENTANYL CITRATE (PF) 100 MCG/2ML IJ SOLN
INTRAMUSCULAR | Status: AC
Start: 2014-12-28 — End: 2014-12-28
  Administered 2014-12-28: 100 ug via INTRAVENOUS
  Filled 2014-12-28: qty 2

## 2014-12-28 MED ORDER — SODIUM BICARBONATE 8.4 % IV SOLN
100.0000 meq | Freq: Once | INTRAVENOUS | Status: AC
  Administered 2014-12-28: 100 meq via INTRAVENOUS

## 2014-12-28 MED ORDER — POTASSIUM CHLORIDE 10 MEQ/50ML IV SOLN
10.0000 meq | INTRAVENOUS | Status: AC
  Administered 2014-12-28: 10 meq via INTRAVENOUS
  Filled 2014-12-28: qty 50

## 2014-12-28 MED ORDER — HEPARIN SODIUM (PORCINE) 5000 UNIT/ML IJ SOLN
5000.0000 [IU] | Freq: Three times a day (TID) | INTRAMUSCULAR | Status: DC
  Administered 2014-12-28 – 2015-01-10 (×39): 5000 [IU] via SUBCUTANEOUS
  Filled 2014-12-28 (×44): qty 1

## 2014-12-28 MED ORDER — MIDAZOLAM BOLUS VIA INFUSION
2.0000 mg | INTRAVENOUS | Status: DC | PRN
  Administered 2014-12-30: 2 mg via INTRAVENOUS
  Filled 2014-12-28 (×2): qty 2

## 2014-12-28 MED ORDER — SODIUM CHLORIDE 0.9 % IV SOLN
3.0000 ug/kg/min | INTRAVENOUS | Status: DC
  Administered 2014-12-28: 3 ug/kg/min via INTRAVENOUS
  Filled 2014-12-28 (×2): qty 20

## 2014-12-28 MED ORDER — PROPOFOL 1000 MG/100ML IV EMUL
INTRAVENOUS | Status: AC
  Administered 2014-12-28: 1000 mg
  Filled 2014-12-28: qty 100

## 2014-12-28 MED ORDER — CISATRACURIUM BESYLATE (PF) 200 MG/20ML IV SOLN
0.5000 ug/kg/min | INTRAVENOUS | Status: DC
  Administered 2014-12-28: 3 ug/kg/min via INTRAVENOUS
  Filled 2014-12-28: qty 20

## 2014-12-28 MED ORDER — POTASSIUM CHLORIDE 10 MEQ/50ML IV SOLN
INTRAVENOUS | Status: AC
  Filled 2014-12-28: qty 50

## 2014-12-28 MED ORDER — SODIUM CHLORIDE 0.9 % IV SOLN
1.0000 mg/h | INTRAVENOUS | Status: DC
  Administered 2014-12-28: 9 mg/h via INTRAVENOUS
  Administered 2014-12-28: 1 mg/h via INTRAVENOUS
  Administered 2014-12-29: 5 mg/h via INTRAVENOUS
  Administered 2014-12-29: 10 mg/h via INTRAVENOUS
  Administered 2014-12-30: 8 mg/h via INTRAVENOUS
  Administered 2014-12-30: 2 mg/h via INTRAVENOUS
  Administered 2014-12-30: 7 mg/h via INTRAVENOUS
  Administered 2014-12-30: 4 mg/h via INTRAVENOUS
  Administered 2014-12-31: 8 mg/h via INTRAVENOUS
  Filled 2014-12-28 (×10): qty 10

## 2014-12-28 NOTE — Progress Notes (Signed)
PCCM Interval Note  ETT placed without difficulty, CXR being performed now  PAD with propofol + fentanyl ordered, cisatracurium ordered.   Vent orders > 6cc/kg = 300cc x 30, FiO2 1.00 + PEEP 16  Check ABG in 1 hour and then prn after   Levy Pupaobert Byrum, MD, PhD 12/28/2014, 6:10 AM [facility] [phone] or if no answer [phone]

## 2014-12-28 NOTE — Progress Notes (Signed)
*  PRELIMINARY RESULTS* Echocardiogram 2D Echocardiogram has been performed.  Jeryl Columbialliott, Dariella Gillihan 12/28/2014, 12:58 PM

## 2014-12-28 NOTE — Progress Notes (Signed)
CRITICAL VALUE ALERT  Critical value received:  ABG  Date of notification:  12/28/14  Time of notification:  0710  Critical value read back:Yes.    Nurse who received alert:  Alycia Rossettiyan   MD notified (1st page):  Dr. Tyson AliasFeinstein   Time of first page:  0720  MD notified (2nd page):  Time of second page:  Responding MD:  Dr. Tyson AliasFeinstein   Time MD responded:  [phone]

## 2014-12-28 NOTE — Progress Notes (Signed)
CRITICAL VALUE ALERT  Critical value received:  ABG  Date of notification:  12/28/2014  Time of notification:  1600  Critical value read back:yes  Nurse who received alert:  Gaspar Garbeanya Kellis Mcadam  MD notified (1st page):  E-Link RN & MD  Time of first page:  1610  MD notified (2nd page):  Time of second page:  Responding MD:  Jaci StandardJill McDaniel,R.N. (E-Link)  Time MD responded:  [phone]

## 2014-12-28 NOTE — Progress Notes (Signed)
eLink Physician-Brief Progress Note Patient Name: Felicia Holt DOB: 08-Nov-1995 MRN: [id number]   Date of Service  12/28/2014  HPI/Events of Note   Recent Labs Lab 12/27/14 2255 12/28/14 0350 12/28/14 0651 12/28/14 1345 12/28/14 1530  PHART 7.308* 7.277* 7.198*  --  7.111*  PCO2ART 32.8* 37.9 47.9*  --  65.5*  PO2ART 56.1* 75.2* 94.4  --  69.6*  HCO3 16.0* 17.1* 17.9*  --  20.0  TCO2 17.0 18.3 19.4  --  22.0  O2SAT 85.8 93.0 96.0 87.1 91.4     eICU Interventions  Severe resp acidopsis on ARDS net protocol with nimbex and RR 35  Plan Start bicarb gtt Repeat abg in 1h     Intervention Category Major Interventions: Acid-Base disturbance - evaluation and management  Felicia Holt 12/28/2014, 4:25 PM

## 2014-12-28 NOTE — Progress Notes (Signed)
Shift event: Early in shift, RN paged secondary to pt satting 90% on RA and c/o mild SOB. O2 at 2L applied and neb give with relief of sx. Later, RN paged again because pt now desatting to 90% even after titrating O2 to 6L. NP ordered CXR and ABG and went to bedside. NRB applied.  S: Pt endorses prior SOB but improved with NRB. Says she has 7/10 chest pain now and had CP, hip pain, epigastric, and back pain on admission. CP doesn't radiate and doesn't worsen with inspiration. CP improves with prns.  O: Obese young AAF in mild respiratory distress. Increased WOB with tachypnea noted although getting better with NRB. Lungs diminished on the left, clear on the right. O2 sat 92-94% on NRB. Alert and oriented. CP is reproducible with palpation. RRR. MOE x 4. LE with 1+ edema-foot to tibia area.  Plan: ABG showed low pH, low PO2 on 6L, O2 sat 85% on 6L, PCO2 OK. CXR with ? ARDS, PNA or edema. [facility] Plan at that point, was to continue NRB and f/up with ABG if needed. However, pt desatted again. Lasix given. Foley placed. Stat labs ordered.  Bipap was ordered. After NP left unit, RN called stating pt was refusing to wear the Bipap. NP to speak with pt. Discussed seriousness of her illness and that her "breathing" has gotten worse and we need to support her airway so that she doesn't "give out" breathing "so hard". Pt says we are just trying to "smother her". Again explained that if we don't try Bipap, there is a great chance that she will have to intubated and placed on the ventilator. She then agreed to try Bipap.   RT placed pt on Bipap. Joneen RoachPaul Hoffman, NP came to bedside to consult for PCCM and PCCM will take over care at this point given pt's acute decline and new pulmonary issues.    Stat labs pending. Discussed case with Mikey BussingHoffman, NP. Dr. Allena KatzPatel of [facility] aware of change in status.  Jimmye NormanKaren Kirby-Graham, NP [facility]

## 2014-12-28 NOTE — Procedures (Signed)
Central Venous Catheter Insertion Procedure Note Felicia ChristenJanai Holt [id number] [birth year]  Procedure: Insertion of Central Venous Catheter Indications: Assessment of intravascular volume, Drug and/or fluid administration and Frequent blood sampling  Procedure Details Consent: Risks of procedure as well as the alternatives and risks of each were explained to the (patient/caregiver).  Consent for procedure obtained. Time Out: Verified patient identification, verified procedure, site/side was marked, verified correct patient position, special equipment/implants available, medications/allergies/relevent history reviewed, required imaging and test results available.  Performed Real time US used to ID and cannulate the vessel.  Maximum sterile technique was used including antiseptics, cap, gloves, gown, hand hygiene, mask and sheet. Skin prep: Chlorhexidine; local anesthetic administered A antimicrobial bonded/coated triple lumen catheter was placed in the right internal jugular vein using the Seldinger technique.  Evaluation Blood flow good Complications: No apparent complications Patient did tolerate procedure well. Chest X-ray ordered to verify placement.  CXR: pending.  Paschal Blanton,PETE 12/28/2014, 10:46 AM

## 2014-12-28 NOTE — Progress Notes (Signed)
Pt complaining of SOB and increased work of breathing. NP notified. Order received for STAT ABG and CXR.  Will continue to monitor.

## 2014-12-28 NOTE — Progress Notes (Signed)
Initial Nutrition Assessment  DOCUMENTATION CODES:  Morbid obesity  INTERVENTION:  Initiate Vital High Protein @ 20 ml/hr via OG tube and increase by 10 ml every 4 hours to goal rate of 60 ml/hr.    Tube feeding regimen provides 1440 kcal, 126 grams of protein, and 1203 ml of H2O.    NUTRITION DIAGNOSIS:  Inadequate oral intake related to inability to eat as evidenced by NPO status.   GOAL:  Provide needs based on ASPEN/SCCM guidelines   MONITOR:  TF tolerance, Vent status, Labs, I & O's  REASON FOR ASSESSMENT:  Consult Enteral/tube feeding initiation and management  ASSESSMENT:  Pt admitted due to flank and abd pain x 1 week, found to have sepsis secondary to suspected pyelonephritis after CT scan. Early 6/28 pt became hypoxemic requiring BiPAP, then eventually intubated with ARDS. Propofol d/c'ed and pt is now paralyzed.   Patient is currently intubated on ventilator support MV: 9.8 L/min Temp (24hrs), Avg:99.2 F (37.3 C), Min:98.5 F (36.9 C), Max:100.2 F (37.9 C)  Labs reviewed: Potassium low, Alkaline Phosphatase elevated Medications reviewed and include: solucortef, novolog, prenatal vitamin  Nutrition-Focused physical exam completed. Findings are no fat depletion, no muscle depletion, and no edema.      Height:  Ht Readings from Last 1 Encounters:  12/27/14 5\' 2"  (1.575 m)    Weight:  Wt Readings from Last 1 Encounters:  12/27/14 232 lb 12.9 oz (105.6 kg)    Ideal Body Weight:  50 kg  Wt Readings from Last 10 Encounters:  12/27/14 232 lb 12.9 oz (105.6 kg)  04/14/14 229 lb (103.874 kg)  03/25/14 230 lb (104.327 kg)  02/25/14 224 lb (101.606 kg)  02/11/14 249 lb (112.946 kg)  02/05/14 286 lb 11.2 oz (130.046 kg)  01/31/14 282 lb 9.6 oz (128.187 kg)  01/26/14 260 lb (117.935 kg)  01/25/14 260 lb (117.935 kg)  01/21/14 262 lb (118.842 kg)    BMI:  Body mass index is 42.57 kg/(m^2).  Estimated Nutritional Needs:  Kcal:   [id number]  Protein:  >/= 125 grams  Fluid:  > 1.5 L/day  Skin:  Reviewed, no issues  Diet Order:  Diet NPO time specified  EDUCATION NEEDS:  No education needs identified at this time   Intake/Output Summary (Last 24 hours) at 12/28/14 0952 Last data filed at 12/28/14 0807  Gross per 24 hour  Intake 2104.33 ml  Output   2550 ml  Net -445.67 ml    Last BM:  PTA   Kendell BaneHeather Starlette Thurow RD, LDN, CNSC [phone] Pager [phone] After Hours Pager

## 2014-12-28 NOTE — Progress Notes (Signed)
Sputum sample obtained and sent down to main lab without any complications.  

## 2014-12-28 NOTE — Consult Note (Signed)
PULMONARY / CRITICAL CARE MEDICINE   Name: Felicia Holt MRN: [id number] DOB: Jun 24, 1996    ADMISSION DATE:  12/26/2014 CONSULTATION DATE:  12/28/2014  REFERRING MD :  Dr. Mahala Menghini  CHIEF COMPLAINT:  SOB  INITIAL PRESENTATION: 19 year old female admitted with flank and abdominal pain x 1 week. She was admitted for sepsis secondary to suspected pyelonephritis after CT scan. She was placed on broad spectrum antibiotics. 6/28 early AM she became hypoxemic requiring BiPAP. CXR revealed new R > L infiltrates. PCCM consulted.   STUDIES:  6/26 CT abd/pelvis: Bilateral pyelonephritis. Both kidneys are diffusely edematous with multi focal areas of abnormal low-density in both kidneys. New hepatomegaly. New edema of the gallbladder wall. No visible stones. The gallbladder is not distended.  SIGNIFICANT EVENTS: 6/28 ett placed, ALI  SUBJECTIVE: acidotic, hypoxic  VITAL SIGNS: Temp:  [98.5 F (36.9 C)-100.2 F (37.9 C)] 99.1 F (37.3 C) (06/28 0758) Pulse Rate:  [98-133] 109 (06/28 0700) Resp:  [21-46] 26 (06/28 0700) BP: (87-163)/(46-106) 120/55 mmHg (06/28 0741) SpO2:  [70 %-100 %] 95 % (06/28 0700) FiO2 (%):  [80 %-100 %] 90 % (06/28 0741) HEMODYNAMICS:   VENTILATOR SETTINGS: Vent Mode:  [-] PRVC FiO2 (%):  [80 %-100 %] 90 % Set Rate:  [14 bmp-35 bmp] 35 bmp Vt Set:  [300 mL] 300 mL PEEP:  [16 cmH20] 16 cmH20 Plateau Pressure:  [20 cmH20-39 cmH20] 39 cmH20 INTAKE / OUTPUT:  Intake/Output Summary (Last 24 hours) at 12/28/14 0852 Last data filed at 12/28/14 4098  Gross per 24 hour  Intake 2304.33 ml  Output   2550 ml  Net -245.67 ml    PHYSICAL EXAMINATION: General:  Morbidly obese female, sedated Neuro:  rass -5 paralyzed HEENT:  PERRL, subconjunctival hemorrhage lateral R eye  Cardiovascular:  Tachy, regular, no MRG Lungs: reduced Abdomen: Soft, paralyzed Hypoactive BS, no r Musculoskeletal:  No acute deformity or ROM limtation Skin:  No edema or skin  breakdown  LABS:  CBC  Recent Labs Lab 12/26/14 1629 12/27/14 0240 12/28/14 0142  WBC 20.7* 24.4* 29.4*  HGB 11.6* 9.2* 9.9*  HCT 34.5* 26.9* 29.2*  PLT 278 268 322   Coag's  Recent Labs Lab 12/28/14 0142  INR 1.31   BMET  Recent Labs Lab 12/26/14 1629 12/27/14 0240 12/28/14 0142  NA 135 137 138  K 3.2* 3.6 3.2*  CL 101 110 110  CO2 23 18* 17*  BUN CREATININE 1.91* 2.04* 2.06*  GLUCOSE 225* 221* 124*   Electrolytes  Recent Labs Lab 12/26/14 1629 12/27/14 0240 12/28/14 0142  CALCIUM 8.3* 7.1* 6.9*   Sepsis Markers  Recent Labs Lab 12/26/14 2154 12/28/14 0142  LATICACIDVEN 2.07* 1.0  PROCALCITON  --  2.18   ABG  Recent Labs Lab 12/27/14 2255 12/28/14 0350 12/28/14 0651  PHART 7.308* 7.277* 7.198*  PCO2ART 32.8* 37.9 47.9*  PO2ART 56.1* 75.2* 94.4   Liver Enzymes  Recent Labs Lab 12/26/14 1629 12/28/14 0142  AST 25 15  ALT 29 18  ALKPHOS 149* 129*  BILITOT 1.4* 1.3*  ALBUMIN 2.8* 1.9*   Cardiac Enzymes No results for input(s): TROPONINI, PROBNP in the last 168 hours. Glucose  Recent Labs Lab 12/27/14 1129 12/27/14 1616 12/27/14 2107 12/28/14 0757  GLUCAP 192* 171* 119* 154*    Imaging Dg Chest Port 1 View  12/28/2014   CLINICAL DATA:  Gestational diabetes. Gastroesophageal reflux disease. Endotracheal E intubated.  EXAM: PORTABLE CHEST - 1 VIEW  COMPARISON:  12/28/2014  FINDINGS: The endotracheal tube tip is in the right mainstem bronchus. Nursing staff reports it has already been pulled back 2 cm. The nasogastric tube extends into the stomach.  There is mild central and basilar airspace opacity bilaterally which could represent ARDS, alveolar edema, or infection. The airspace opacities are not significantly different from the earlier study. There is no pneumothorax.  IMPRESSION: Right mainstem intubation. These results were called by telephone at the time of interpretation on 12/28/2014 at 6:25 am to the patient's  nurse, who verbally acknowledged.   Electronically Signed   By: Ellery Plunkaniel R Mitchell M.D.   On: 12/28/2014 06:26   Dg Chest Port 1 View  12/28/2014   CLINICAL DATA:  Respiratory distress  EXAM: PORTABLE CHEST - 1 VIEW  COMPARISON:  12/27/2014  FINDINGS: Diffuse ground-glass opacity persists throughout the right lung. There is developing opacity in the left retrocardiac base. No large effusions.  IMPRESSION: Bilateral airspace opacities, particularly in the right upper lobe and left lower lobe. This could be infectious. ARDS or pulmonary edema may also be considered.   Electronically Signed   By: Ellery Plunkaniel R Mitchell M.D.   On: 12/28/2014 04:41   Dg Chest Port 1 View  12/27/2014   CLINICAL DATA:  Hypoxia.  Increasing shortness of breath tonight.  EXAM: PORTABLE CHEST - 1 VIEW  COMPARISON:  Chest radiograph 1 day prior at 1736 hr  FINDINGS: Low lung volumes with development of diffuse opacity throughout the right hemithorax and mild elevation of right hemidiaphragm. Minimal left perihilar opacity is also new. The heart is at the upper limits of normal in size. There is no pneumothorax.  IMPRESSION: Development of diffuse opacity throughout the right hemithorax, with minimal left perihilar opacity, this may reflect asymmetric pulmonary edema, pneumonia, or ARDS.   Electronically Signed   By: Rubye OaksMelanie  Ehinger M.D.   On: 12/27/2014 23:05     ASSESSMENT / PLAN:  PULMONARY A: Acute hypoxic respiratory failure R > L pulmonary infiltrates Concern ARDS/ALI developing ?Pulmonary edema Doubt PE  P:   ABg reviewed ARDS protocol fio2 reduction Rate increased Repeat abg Keep plat less 30  parlaysis x 48 hr min Doppler legs  CARDIOVASCULAR A:  Tachycardia, sinus Hypertension, now dropping Pleuritic chest pain Water bottle heart - r/o effusion P:  Telemetry monitoring likely will need pressors, MAp lower Repeat LA Need cvp Get echo Start levophed, sepsis protocol  RENAL A:   AKI -  worsening Hypokalemia High AG metabolic acidosis Pyelonephritis bilateral?  P:   Follow Bmet q8h Repeat imagine now for hydro, gas on kidneys stat Foley Pos balance bolus  GASTROINTESTINAL A:   Abdominal pain  P:   Check amylase, lipase Consider US abd- done Repeat kidneys, see above ppi Feeds likely to start , oxepa  HEMATOLOGIC A:   Anemia dvt prevention P:  Follow CBC Transfuse per ICU guidelines Add sub q heparin  INFECTIOUS A:   Severe sepsis secondary to pyelonephritis.  R/o worsening gas?, hydro now  P:   BCx2 6/28 >>> Abx: pip/tazo, start date 6/26 >>> Abx: vancomycin, start date 6/26 >>>  Repeat US stat, eval new hydro  ENDOCRINE A:   Probable DM 2 R/o rel AI P:   CBG monitoring and SSI tsh Crtisol, stress roids  NEUROLOGIC A:   Severe ARDS  P:   RASS goal: 0 Monitor closely Requires paralysis Dc prop, drop in bp Add versed to fent Use official neuromuscl blockade order set  FAMILY  - Updates: Dr Delton CoombesByrum discussed  current issues, critical status with pt's Mom and family at bedside 6/28.  -updated family DR 6/28  - Inter-disciplinary family meet or Palliative Care meeting due by:  7/5    Ccm time additional 45 min   Mcarthur Rossetti. Tyson Alias, MD, FACP Pgr: [phone] [facility]

## 2014-12-28 NOTE — Procedures (Signed)
Intubation Procedure Note Donna ChristenJanai Dant [id number] 1995-07-13  Procedure: Intubation Indications: Respiratory insufficiency  Procedure Details Consent: Risks of procedure as well as the alternatives and risks of each were explained to the (patient/caregiver).  Consent for procedure obtained. Time Out: Verified patient identification, verified procedure, site/side was marked, verified correct patient position, special equipment/implants available, medications/allergies/relevent history reviewed, required imaging and test results available.  Performed  Meds:  Etomidate 40mg , versed 4mg , fentanyl 200mcg  Maximum sterile technique was used including gloves, hand hygiene and mask.  8.0 ETT was placed using Mac-3 Glidescope. Placement confirmed by direct visualization, ETCO2, auscultation. Pt transiently desaturated to 80's during procedure, was quite slow to recover until PEEP valve was added.     Evaluation Hemodynamic Status: BP stable throughout; O2 sats: transiently fell during during procedure Patient's Current Condition: stable Complications: No apparent complications Patient did tolerate procedure well. Chest X-ray ordered to verify placement.  CXR: pending.   Levy Pupaobert Rukiya Hodgkins, MD, PhD 12/28/2014, 5:57 AM [facility] [phone] or if no answer [phone]

## 2014-12-28 NOTE — Consult Note (Signed)
PULMONARY / CRITICAL CARE MEDICINE   Name: Felicia Holt MRN: [id number] DOB: December 23, 1995    ADMISSION DATE:  12/26/2014 CONSULTATION DATE:  12/28/2014  REFERRING MD :  Dr. Mahala Menghini  CHIEF COMPLAINT:  SOB  INITIAL PRESENTATION: 19 year old female admitted with flank and abdominal pain x 1 week. She was admitted for sepsis secondary to suspected pyelonephritis after CT scan. She was placed on broad spectrum antibiotics. 6/28 early AM she became hypoxemic requiring BiPAP. CXR revealed new R > L infiltrates. PCCM consulted.   STUDIES:  6/26 CT abd/pelvis: Bilateral pyelonephritis. Both kidneys are diffusely edematous with multi focal areas of abnormal low-density in both kidneys. New hepatomegaly. New edema of the gallbladder wall. No visible stones. The gallbladder is not distended.  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 She presented to [facility] ED 6/26 with c/o upper abdominal pain and L flank pain x 1 week. In ED she was found to be hypotensive and tachycardic. CT scand was ordered and showed bilateral pyelonephritis, new hepatomegaly, and edema of gall bladder. She was admitted for presumed sepsis secondary to pyelo and started on broad spectrum antibiotics. 6/28 early AM she became acutely hypoxemic despite increasing supplemental O2, when placed on NRB she had sats in low 90s. CXR showed new R>L infiltrates. She was tachypneic and placed on BiPAP. PCCM consulted. Currently complains of upper abdominal pain, chest pain made worse with deep breathing, movement, and palpation, and SOB.   PAST MEDICAL HISTORY :   has a past medical history of Gestational diabetes; GERD (gastroesophageal reflux disease); and Morbid obesity.  has past surgical history that includes Tonsillectomy; Wisdom tooth extraction; Adenoidectomy; and Cesarean section (N/A, 02/03/2014). Prior to  Admission medications   Medication Sig Start Date End Date Taking? Authorizing Provider  ibuprofen (ADVIL,MOTRIN) 200 MG tablet Take 400 mg by mouth every 6 (six) hours as needed for moderate pain.   Yes Historical Provider, MD  hydrochlorothiazide (MICROZIDE) 12.5 MG capsule Take 1 capsule (12.5 mg total) by mouth daily. 02/07/14   Antionette Char, MD  Prenatal Vit-Fe Fumarate-FA (PRENATAL MULTIVITAMIN) TABS tablet Take 1 tablet by mouth daily at 12 noon.    Historical Provider, MD  sertraline (ZOLOFT) 50 MG tablet Take 1 tablet (50 mg total) by mouth daily. 02/25/14   Brock Bad, MD   No Known Allergies  FAMILY HISTORY:  indicated that her mother is alive. She indicated that her father is alive.  SOCIAL HISTORY:  reports that she has never smoked. She has never used smokeless tobacco. She reports that she does not drink alcohol or use illicit drugs.  REVIEW OF SYSTEMS:   Bolds are positive  Constitutional: weight loss, gain, night sweats, Fevers, chills, fatigue .  HEENT: headaches, Sore throat, sneezing, nasal congestion, post nasal drip, Difficulty swallowing, Tooth/dental problems, visual complaints visual changes, ear ache CV:  chest pain, radiates: ,Orthopnea, PND, swelling in lower extremities, dizziness, palpitations, syncope.  GI  heartburn, indigestion, abdominal pain, nausea, vomiting, diarrhea, change in bowel habits, loss of appetite, bloody stools.  Resp: cough, productive: , hemoptysis, dyspnea, chest pain, pleuritic.  Skin: rash or itching or icterus GU: dysuria, change in color of urine, urgency or frequency. flank pain, hematuria  MS: joint pain or swelling. decreased range of motion  Psych: change in mood or affect. depression or anxiety.  Neuro: difficulty with speech, weakness, numbness, ataxia  SUBJECTIVE:   VITAL SIGNS: Temp:  [98 F (36.7 C)-100.2 F (37.9 C)] 100.2 F (37.9 C) (06/27 2351) Pulse Rate:  [98-133] 113 (06/28 0225) Resp:  [0-40] 36  (06/28 0225) BP: (87-156)/(44-103) 146/103 mmHg (06/28 0200) SpO2:  [70 %-100 %] 97 % (06/28 0225) FiO2 (%):  [100 %] 100 % (06/28 0000) HEMODYNAMICS:   VENTILATOR SETTINGS: Vent Mode:  [-]  FiO2 (%):  [100 %] 100 % INTAKE / OUTPUT:  Intake/Output Summary (Last 24 hours) at 12/28/14 13080307 Last data filed at 12/28/14 0200  Gross per 24 hour  Intake 2595.5 ml  Output    400 ml  Net 2195.5 ml    PHYSICAL EXAMINATION: General:  Morbidly obese female in moderate distress Neuro:  Alert, oriented, non-focal HEENT:  [place]/AT, no JVD noted, PERRL, subconjunctival hemorrhage lateral R eye  Cardiovascular:  Tachy, regular, no MRG Lungs:  Diminished R base, otherwise distant but clear Abdomen: Soft, tender in bilateral upper quadrants. Hypoactive BS Musculoskeletal:  No acute deformity or ROM limtation Skin:  No edema or skin breakdown  LABS:  CBC  Recent Labs Lab 12/26/14 1629 12/27/14 0240 12/28/14 0142  WBC 20.7* 24.4* 29.4*  HGB 11.6* 9.2* 9.9*  HCT 34.5* 26.9* 29.2*  PLT 278 268 322   Coag's No results for input(s): APTT, INR in the last 168 hours. BMET  Recent Labs Lab 12/26/14 1629 12/27/14 0240  NA 135 137  K 3.2* 3.6  CL 101 110  CO2 23 18*  BUN 15 15  CREATININE 1.91* 2.04*  GLUCOSE 225* 221*   Electrolytes  Recent Labs Lab 12/26/14 1629 12/27/14 0240  CALCIUM 8.3* 7.1*   Sepsis Markers  Recent Labs Lab 12/26/14 2154  LATICACIDVEN 2.07*   ABG  Recent Labs Lab 12/27/14 2255  PHART 7.308*  PCO2ART 32.8*  PO2ART 56.1*   Liver Enzymes  Recent Labs Lab 12/26/14 1629  AST 25  ALT 29  ALKPHOS 149*  BILITOT 1.4*  ALBUMIN 2.8*   Cardiac Enzymes No results for input(s): TROPONINI, PROBNP in the last 168 hours. Glucose  Recent Labs Lab 12/27/14 1129 12/27/14 1616 12/27/14 2107  GLUCAP 192* 171* 119*    Imaging Dg Chest Port 1 View  12/27/2014   CLINICAL DATA:  Hypoxia.  Increasing shortness of breath tonight.  EXAM: PORTABLE  CHEST - 1 VIEW  COMPARISON:  Chest radiograph 1 day prior at 1736 hr  FINDINGS: Low lung volumes with development of diffuse opacity throughout the right hemithorax and mild elevation of right hemidiaphragm. Minimal left perihilar opacity is also new. The heart is at the upper limits of normal in size. There is no pneumothorax.  IMPRESSION: Development of diffuse opacity throughout the right hemithorax, with minimal left perihilar opacity, this may reflect asymmetric pulmonary edema, pneumonia, or ARDS.   Electronically Signed   By: Rubye OaksMelanie  Ehinger M.D.   On: 12/27/2014 23:05     ASSESSMENT / PLAN:  PULMONARY A: Acute hypoxic respiratory failure R > L pulmonary infiltrates Concern ARDS/ALI developing ?Pulmonary edema Doubt PE  P:   Continuous BiPAP Lasix x 1 per TRH Keep SpO2 greater than 92% Repeat ABG and CXR after 30 mins BiPAP Concern may need intubation and MV  CARDIOVASCULAR A:  Tachycardia, sinus Hypertension Pleuritic chest pain  P:  Telemetry monitoring EKG  RENAL A:   AKI - worsening Hypokalemia High AG metabolic acidosis Pyelonephritis  P:   Follow Bmet Would like to hydrate, but based on CXR will hold off for now Replete  K D/C toradol  GASTROINTESTINAL A:   Abdominal pain  P:   Check amylase, lipase NPO while on BiPAP Consider [place] abd  HEMATOLOGIC A:   Anemia  P:  Follow CBC Transfuse per ICU guidelines  INFECTIOUS A:   Severe sepsis secondary to pyelonephritis.   P:   BCx2 6/28 >>> Abx: pip/tazo, start date 6/26 >>> Abx: vancomycin, start date 6/26 >>> PCT  ENDOCRINE A:   Probable DM 2  P:   CBG monitoring and SSI  NEUROLOGIC A:   No acute issues  P:   RASS goal: 0 Monitor closely   FAMILY  - Updates: Dr Delton Coombes discussed current issues, critical status with pt's Mom and family at bedside 6/28.   - Inter-disciplinary family meet or Palliative Care meeting due by:  7/5    Joneen Roach, AGACNP-BC [place]  [facility]/Critical Care Pager [phone] or [phone] 12/28/2014 3:25 AM   Attending Note:  I have examined patient, reviewed labs, studies and notes. I have discussed the case with Henreitta Leber, and I agree with the data and plans as amended above. 19 yo obese woman with UTI and pyelonephritis, has evolving B infiltrates and respiratory failure over the last 24h consistent w ARDS. On my evaluation she is tachypneic and in some moderate respiratory distress. Review of her CXR and ABG show worsening of both. I believe she needs intubation and MV support. I have explained the situation and plans to the patient and the her family. Independent critical care time is 60 minutes.   Levy Pupa, MD, PhD 12/28/2014, 5:31 AM [facility] [phone] or if no answer [phone]

## 2014-12-28 NOTE — Progress Notes (Signed)
Patient will not tolerate the bipap. Patient was placed back on NRB. Nurse was notified, waiting on MD for further guidance. RT will continue to monitor.

## 2014-12-28 NOTE — Progress Notes (Signed)
eLink Physician-Brief Progress Note Patient Name: Felicia ChristenJanai Holt DOB: [birth year] MRN: [id number]   Date of Service  12/28/2014  HPI/Events of Note  Evening lab ereview   0 creat better, ca low at 6.7, cbc ok,  PLAN  - replete calcium  2. Acidosis   - resolved on bic gtt  - plan continue bic gtt  3. RN says BIS now jumping to 70 despite high fent/versed   - plan reassess in 1h. KEep lights off and place calm    eICU Interventions       Intervention Category Intermediate Interventions: Diagnostic test evaluation  Guadalupe Kerekes 12/28/2014, 8:49 PM

## 2014-12-28 NOTE — Progress Notes (Signed)
BIS monitor was reading in the 70s. Notified ELINK MD he suggested I quieted the room and watched her for an hour. The BIS monitor is now reading in the 50s-60s. MD updated. Will continue to monitor.

## 2014-12-28 NOTE — Progress Notes (Signed)
   12/28/14 0225  BiPAP/CPAP/SIPAP  BiPAP/CPAP/SIPAP Pt Type Adult  Mask Type Full face mask  Mask Size Medium  Set Rate 12 breaths/min  Respiratory Rate 36 breaths/min  IPAP 10 cmH20  EPAP 5 cmH2O  Oxygen Percent 80 %  Minute Ventilation 15.9  Leak 21  Peak Inspiratory Pressure (PIP) 11  Tidal Volume (Vt) 460  BiPAP/CPAP/SIPAP BiPAP  Patient Home Equipment No  Auto Titrate No  BiPAP/CPAP /SiPAP Vitals  Pulse Rate (!) 113  Resp (!) 36  SpO2 97 %  Patient did agree to wear the BIPAP after myself and the nurse practitioner explained the benefit of the BIPAP versus intubation. RT will continue to monitor.

## 2014-12-28 NOTE — Progress Notes (Signed)
VASCULAR LAB PRELIMINARY  PRELIMINARY  PRELIMINARY  PRELIMINARY  Bilateral lower extremity venous duplex completed.    Preliminary report:  Bilateral:  No evidence of DVT, superficial thrombosis, or Baker's Cyst.   Jadan Hinojos, RVS 12/28/2014, 2:55 PM

## 2014-12-28 NOTE — Progress Notes (Signed)
Chest xray was performed, ET tube withdrawn from 27@ lips to 25@ the lips per MD order. RT will continue to monitor.

## 2014-12-28 NOTE — Procedures (Signed)
Arterial Catheter Insertion Procedure Note Felicia Holt [id number] 12-Aug-1995  Procedure: Insertion of Arterial Catheter  Indications: Blood pressure monitoring and Frequent blood sampling  Procedure Details Consent: Risks of procedure as well as the alternatives and risks of each were explained to the (patient/caregiver).  Consent for procedure obtained. Time Out: Verified patient identification, verified procedure, site/side was marked, verified correct patient position, special equipment/implants available, medications/allergies/relevent history reviewed, required imaging and test results available.  Performed  Maximum sterile technique was used including antiseptics, cap, gloves, gown, hand hygiene, mask and sheet. Skin prep: Chlorhexidine; local anesthetic administered 20 gauge catheter was inserted into right radial artery using the Seldinger technique.  Evaluation Blood flow good; BP tracing good. Complications: No apparent complications.   Felicia Holt,PETE 12/28/2014

## 2014-12-29 ENCOUNTER — Inpatient Hospital Stay (HOSPITAL_COMMUNITY): Payer: Medicaid Other

## 2014-12-29 LAB — COMPREHENSIVE METABOLIC PANEL
ALBUMIN: 1.5 g/dL — AB (ref 3.5–5.0)
ALT: 13 U/L — ABNORMAL LOW (ref 14–54)
AST: 13 U/L — AB (ref 15–41)
Alkaline Phosphatase: 105 U/L (ref 38–126)
Anion gap: 9 (ref 5–15)
BUN: 19 mg/dL (ref 6–20)
CALCIUM: 6.8 mg/dL — AB (ref 8.9–10.3)
CO2: 24 mmol/L (ref 22–32)
CREATININE: 1.86 mg/dL — AB (ref 0.44–1.00)
Chloride: 110 mmol/L (ref 101–111)
GFR calc Af Amer: 44 mL/min — ABNORMAL LOW (ref >60)
GFR calc non Af Amer: 38 mL/min — ABNORMAL LOW (ref >60)
Glucose, Bld: 288 mg/dL — ABNORMAL HIGH (ref 65–99)
Potassium: 3.5 mmol/L (ref 3.5–5.1)
Sodium: 143 mmol/L (ref 135–145)
Total Bilirubin: 1.1 mg/dL (ref 0.3–1.2)
Total Protein: 5.2 g/dL — ABNORMAL LOW (ref 6.5–8.1)

## 2014-12-29 LAB — URINALYSIS, ROUTINE W REFLEX MICROSCOPIC
Bilirubin Urine: NEGATIVE
GLUCOSE, UA: 250 mg/dL — AB
Ketones, ur: NEGATIVE mg/dL
Nitrite: NEGATIVE
Protein, ur: 30 mg/dL — AB
SPECIFIC GRAVITY, URINE: 1.014 (ref 1.005–1.030)
UROBILINOGEN UA: 2 mg/dL — AB (ref 0.0–1.0)
pH: 6.5 (ref 5.0–8.0)

## 2014-12-29 LAB — GLUCOSE, CAPILLARY
GLUCOSE-CAPILLARY: 369 mg/dL — AB (ref 65–99)
GLUCOSE-CAPILLARY: 301 mg/dL — AB (ref 65–99)
Glucose-Capillary: 226 mg/dL — ABNORMAL HIGH (ref 65–99)
Glucose-Capillary: 328 mg/dL — ABNORMAL HIGH (ref 65–99)
Glucose-Capillary: 327 mg/dL — ABNORMAL HIGH (ref 65–99)
Glucose-Capillary: 255 mg/dL — ABNORMAL HIGH (ref 65–99)

## 2014-12-29 LAB — BASIC METABOLIC PANEL
Anion gap: 8 (ref 5–15)
BUN: 20 mg/dL (ref 6–20)
CO2: 26 mmol/L (ref 22–32)
CREATININE: 1.71 mg/dL — AB (ref 0.44–1.00)
Calcium: 6.9 mg/dL — ABNORMAL LOW (ref 8.9–10.3)
Chloride: 109 mmol/L (ref 101–111)
GFR, EST AFRICAN AMERICAN: 48 mL/min — AB (ref >60)
GFR, EST NON AFRICAN AMERICAN: 42 mL/min — AB (ref >60)
Glucose, Bld: 375 mg/dL — ABNORMAL HIGH (ref 65–99)
Potassium: 3.6 mmol/L (ref 3.5–5.1)
Sodium: 143 mmol/L (ref 135–145)

## 2014-12-29 LAB — CBC WITH DIFFERENTIAL/PLATELET
BASOS PCT: 0 % (ref 0–1)
Basophils Absolute: 0 10*3/uL (ref 0.0–0.1)
EOS ABS: 0 10*3/uL (ref 0.0–0.7)
Eosinophils Relative: 0 % (ref 0–5)
HEMATOCRIT: 24 % — AB (ref 36.0–46.0)
HEMOGLOBIN: 8.1 g/dL — AB (ref 12.0–15.0)
Lymphocytes Relative: 6 % — ABNORMAL LOW (ref 12–46)
Lymphs Abs: 1.3 10*3/uL (ref 0.7–4.0)
MCH: 26 pg (ref 26.0–34.0)
MCHC: 33.8 g/dL (ref 30.0–36.0)
MCV: 76.9 fL — AB (ref 78.0–100.0)
MONO ABS: 0.8 10*3/uL (ref 0.1–1.0)
Monocytes Relative: 4 % (ref 3–12)
Neutro Abs: 19.1 10*3/uL — ABNORMAL HIGH (ref 1.7–7.7)
Neutrophils Relative %: 90 % — ABNORMAL HIGH (ref 43–77)
Platelets: 256 10*3/uL (ref 150–400)
RBC: 3.12 MIL/uL — ABNORMAL LOW (ref 3.87–5.11)
RDW: 15.7 % — AB (ref 11.5–15.5)
WBC: 21.2 10*3/uL — ABNORMAL HIGH (ref 4.0–10.5)

## 2014-12-29 LAB — POCT I-STAT 3, ART BLOOD GAS (G3+)
Acid-base deficit: 2 mmol/L (ref 0.0–2.0)
Bicarbonate: 25 mEq/L — ABNORMAL HIGH (ref 20.0–24.0)
Bicarbonate: 25.1 mEq/L — ABNORMAL HIGH (ref 20.0–24.0)
O2 SAT: 100 %
O2 SAT: 97 %
PCO2 ART: 41.4 mmHg (ref 35.0–45.0)
PO2 ART: 299 mmHg — AB (ref 80.0–100.0)
PO2 ART: 102 mmHg — AB (ref 80.0–100.0)
Patient temperature: 98.1
TCO2: 26 mmol/L (ref 0–100)
TCO2: 27 mmol/L (ref 0–100)
pCO2 arterial: 52.9 mmHg — ABNORMAL HIGH (ref 35.0–45.0)
pH, Arterial: 7.388 (ref 7.350–7.450)
pH, Arterial: 7.282 — ABNORMAL LOW (ref 7.350–7.450)

## 2014-12-29 LAB — HEMOGLOBIN A1C
HEMOGLOBIN A1C: 5.7 % — AB (ref 4.8–5.6)
Mean Plasma Glucose: 117 mg/dL

## 2014-12-29 LAB — URINE MICROSCOPIC-ADD ON

## 2014-12-29 LAB — HIV ANTIBODY (ROUTINE TESTING W REFLEX): HIV SCREEN 4TH GENERATION: NONREACTIVE

## 2014-12-29 LAB — PROCALCITONIN: PROCALCITONIN: 1.43 ng/mL

## 2014-12-29 MED ORDER — HYDROCORTISONE NA SUCCINATE PF 100 MG IJ SOLR
25.0000 mg | Freq: Four times a day (QID) | INTRAMUSCULAR | Status: DC
  Administered 2014-12-29 – 2014-12-30 (×4): 25 mg via INTRAVENOUS
  Filled 2014-12-29 (×8): qty 0.5

## 2014-12-29 MED ORDER — POTASSIUM CHLORIDE 10 MEQ/50ML IV SOLN
10.0000 meq | INTRAVENOUS | Status: AC
  Administered 2014-12-29 (×2): 10 meq via INTRAVENOUS
  Filled 2014-12-29 (×2): qty 50

## 2014-12-29 MED ORDER — INSULIN GLARGINE 100 UNIT/ML ~~LOC~~ SOLN
10.0000 [IU] | Freq: Every day | SUBCUTANEOUS | Status: DC
  Administered 2014-12-29 – 2014-12-31 (×3): 10 [IU] via SUBCUTANEOUS
  Filled 2014-12-29 (×3): qty 0.1

## 2014-12-29 MED ORDER — CHLORHEXIDINE GLUCONATE 0.12 % MT SOLN
15.0000 mL | Freq: Two times a day (BID) | OROMUCOSAL | Status: DC
  Administered 2014-12-29 – 2015-01-06 (×17): 15 mL via OROMUCOSAL
  Filled 2014-12-29 (×18): qty 15

## 2014-12-29 MED ORDER — INSULIN ASPART 100 UNIT/ML ~~LOC~~ SOLN
0.0000 [IU] | SUBCUTANEOUS | Status: DC
  Administered 2014-12-29: 15 [IU] via SUBCUTANEOUS
  Administered 2014-12-29: 8 [IU] via SUBCUTANEOUS
  Administered 2014-12-29: 5 [IU] via SUBCUTANEOUS
  Administered 2014-12-29: 11 [IU] via SUBCUTANEOUS
  Administered 2014-12-30: 8 [IU] via SUBCUTANEOUS
  Administered 2014-12-30 (×2): 5 [IU] via SUBCUTANEOUS
  Administered 2014-12-30 (×2): 8 [IU] via SUBCUTANEOUS
  Administered 2014-12-30 – 2014-12-31 (×2): 5 [IU] via SUBCUTANEOUS
  Administered 2014-12-31: 3 [IU] via SUBCUTANEOUS
  Administered 2014-12-31: 5 [IU] via SUBCUTANEOUS
  Administered 2014-12-31 (×3): 3 [IU] via SUBCUTANEOUS
  Administered 2015-01-01: 5 [IU] via SUBCUTANEOUS
  Administered 2015-01-01 (×2): 3 [IU] via SUBCUTANEOUS
  Administered 2015-01-01: 2 [IU] via SUBCUTANEOUS
  Administered 2015-01-01: 8 [IU] via SUBCUTANEOUS
  Administered 2015-01-01 – 2015-01-02 (×4): 3 [IU] via SUBCUTANEOUS
  Administered 2015-01-02: 5 [IU] via SUBCUTANEOUS
  Administered 2015-01-02 (×2): 3 [IU] via SUBCUTANEOUS
  Administered 2015-01-03: 5 [IU] via SUBCUTANEOUS
  Administered 2015-01-03: 3 [IU] via SUBCUTANEOUS
  Administered 2015-01-03: 8 [IU] via SUBCUTANEOUS
  Administered 2015-01-03 (×3): 5 [IU] via SUBCUTANEOUS
  Administered 2015-01-04 (×2): 3 [IU] via SUBCUTANEOUS
  Administered 2015-01-04 (×2): 5 [IU] via SUBCUTANEOUS
  Administered 2015-01-04: 3 [IU] via SUBCUTANEOUS
  Administered 2015-01-04: 5 [IU] via SUBCUTANEOUS
  Administered 2015-01-05 – 2015-01-06 (×7): 3 [IU] via SUBCUTANEOUS
  Administered 2015-01-06: 2 [IU] via SUBCUTANEOUS
  Administered 2015-01-06 (×3): 3 [IU] via SUBCUTANEOUS
  Administered 2015-01-06: 2 [IU] via SUBCUTANEOUS
  Administered 2015-01-07: 5 [IU] via SUBCUTANEOUS
  Administered 2015-01-07: 3 [IU] via SUBCUTANEOUS
  Administered 2015-01-07: 8 [IU] via SUBCUTANEOUS
  Administered 2015-01-07: 3 [IU] via SUBCUTANEOUS
  Administered 2015-01-07: 2 [IU] via SUBCUTANEOUS
  Administered 2015-01-08: 5 [IU] via SUBCUTANEOUS
  Administered 2015-01-08: 3 [IU] via SUBCUTANEOUS
  Administered 2015-01-08 (×4): 5 [IU] via SUBCUTANEOUS
  Administered 2015-01-09: 2 [IU] via SUBCUTANEOUS
  Administered 2015-01-09 (×3): 3 [IU] via SUBCUTANEOUS
  Administered 2015-01-10: 2 [IU] via SUBCUTANEOUS
  Administered 2015-01-10: 5 [IU] via SUBCUTANEOUS
  Administered 2015-01-10: 3 [IU] via SUBCUTANEOUS

## 2014-12-29 MED ORDER — CETYLPYRIDINIUM CHLORIDE 0.05 % MT LIQD
7.0000 mL | Freq: Four times a day (QID) | OROMUCOSAL | Status: DC
  Administered 2014-12-29 – 2015-01-06 (×33): 7 mL via OROMUCOSAL

## 2014-12-29 NOTE — Consult Note (Signed)
PULMONARY / CRITICAL CARE MEDICINE   Name: Felicia Holt MRN: [id number] DOB: [birth year]    ADMISSION DATE:  12/26/2014 CONSULTATION DATE:  12/28/2014  REFERRING MD :  Dr. Mahala Menghini  CHIEF COMPLAINT:  SOB  INITIAL PRESENTATION: 19 year old female admitted with flank and abdominal pain x 1 week. She was admitted for sepsis secondary to suspected pyelonephritis after CT scan. She was placed on broad spectrum antibiotics. 6/28 early AM she became hypoxemic requiring BiPAP. CXR revealed new R > L infiltrates. PCCM consulted.   STUDIES:  6/26 CT abd/pelvis: Bilateral pyelonephritis. Both kidneys are diffusely edematous with multi focal areas of abnormal low-density in both kidneys. New hepatomegaly. New edema of the gallbladder wall. No visible stones. The gallbladder is not distended. 6/28 US renal>>>Heterogeneity of the kidneys consistent with the finding seen on recent CT, Gallbladder wall edema without definitive cholelithiasis. 6/28 dopplers>>>neg lowers 6/28 echo>>>65%, PA 52   SIGNIFICANT EVENTS: 6/28 ett placed, ALI, shock, MODS  SUBJECTIVE: no pressors  VITAL SIGNS: Temp:  [97.7 F (36.5 C)-98.7 F (37.1 C)] 98.1 F (36.7 C) (06/29 0752) Pulse Rate:  [61-123] 61 (06/29 0821) Resp:  [31-36] 35 (06/29 0821) BP: (99-128)/(50-69) 128/64 mmHg (06/29 0821) SpO2:  [92 %-100 %] 100 % (06/29 0821) Arterial Line BP: (81-206)/(44-90) 126/52 mmHg (06/29 0600) FiO2 (%):  [40 %-90 %] 50 % (06/29 0821) Weight:  [118.6 kg (261 lb 7.5 oz)] 118.6 kg (261 lb 7.5 oz) (06/29 0334) HEMODYNAMICS: CVP:  [10 mmHg-18 mmHg] 18 mmHg VENTILATOR SETTINGS: Vent Mode:  [-] PRVC FiO2 (%):  [40 %-90 %] 50 % Set Rate:  [35 bmp] 35 bmp Vt Set:  [300 mL-350 mL] 350 mL PEEP:  [10 cmH20-14 cmH20] 10 cmH20 Plateau Pressure:  [20 cmH20-30 cmH20] 28 cmH20 INTAKE / OUTPUT:  Intake/Output Summary (Last 24 hours) at 12/29/14 0921 Last data filed at 12/29/14 0800  Gross per 24 hour  Intake 4756.58 ml  Output    3490 ml  Net 1266.58 ml    PHYSICAL EXAMINATION: General:  Morbidly obese female, sedated Neuro:  rass -5 paralyzed HEENT:  PERRL, subconjunctival hemorrhage lateral R eye mild  Cardiovascular:  s1 s2 regular, no MRG Lungs: reduced coarse Abdomen: Soft, Hypoactive BS, no r, obese Musculoskeletal:  No acute deformity or ROM limtation Skin:  No edema or skin breakdown  LABS:  CBC  Recent Labs Lab 12/28/14 0142 12/28/14 1129 12/29/14 0230  WBC 29.4* 27.7* 21.2*  HGB 9.9* 9.7* 8.1*  HCT 29.2* 29.1* 24.0*  PLT 322 294 256   Coag's  Recent Labs Lab 12/28/14 0142 12/28/14 1129  APTT  --  37  INR 1.31 1.30   BMET  Recent Labs Lab 12/28/14 1129 12/28/14 1813 12/29/14 0230  NA 141 143 143  K 3.9 4.1 3.5  CL 111 112* 110  CO2 21* 25 24  BUN CREATININE 2.23* 2.08* 1.86*  GLUCOSE 144* 161* 288*   Electrolytes  Recent Labs Lab 12/28/14 1129 12/28/14 1813 12/29/14 0230  CALCIUM 7.2* 6.7* 6.8*   Sepsis Markers  Recent Labs Lab 12/26/14 2154 12/28/14 0142 12/28/14 1127 12/29/14 0230  LATICACIDVEN 2.07* 1.0 0.7  --   PROCALCITON  --  2.18  --  1.43   ABG  Recent Labs Lab 12/28/14 1530 12/28/14 1850 12/29/14 0356  PHART 7.111* 7.305* 7.388  PCO2ART 65.5* 44.8 41.4  PO2ART 69.6* 165* 299.0*   Liver Enzymes  Recent Labs Lab 12/28/14 0142 12/28/14 1129 12/29/14 0230  AST 15 14*  13*  ALT 18 18 13*  ALKPHOS 129* 107 105  BILITOT 1.3* 1.4* 1.1  ALBUMIN 1.9* 1.7* 1.5*   Cardiac Enzymes  Recent Labs Lab 12/28/14 1129  TROPONINI 0.09*   Glucose  Recent Labs Lab 12/28/14 1311 12/28/14 1634 12/28/14 2027 12/28/14 2326 12/29/14 0446 12/29/14 0747  GLUCAP 145* 151* 203* 226* 328* 327*    Imaging US Abdomen Complete  12/28/2014   CLINICAL DATA:  Abdominal pain, sepsis, acute renal failure  EXAM: ULTRASOUND ABDOMEN COMPLETE  COMPARISON:  12/26/2014  FINDINGS: Gallbladder: There is edema in the gallbladder wall measuring  6.9 mm. This is similar to that seen on recent CT examination. No definitive calculi are seen.  Common bile duct: Diameter: 3 mm  Liver: No focal lesion identified. Within normal limits in parenchymal echogenicity.  IVC: No abnormality visualized.  Pancreas: Not well visualized due to overlying bowel gas.  Spleen: Size and appearance within normal limits.  Right Kidney: Length: 14.4 cm. Diffuse heterogeneity is identified consistent with the finding seen on recent CT examination of pyelonephritis.  Left Kidney: Length: 14.6 cm. Diffuse heterogeneity is again identified consistent with the findings of pyelonephritis on recent CT examination.  Abdominal aorta: No aneurysm visualized.  Other findings: None.  IMPRESSION: Heterogeneity of the kidneys consistent with the finding seen on recent CT.  Gallbladder wall edema without definitive cholelithiasis.   Electronically Signed   By: Alcide Clever M.D.   On: 12/28/2014 15:36   Dg Chest Port 1 View  12/28/2014   CLINICAL DATA:  Central line placement in a patient with pyelonephritis.  EXAM: PORTABLE CHEST - 1 VIEW  COMPARISON:  12/28/2014  FINDINGS: There has been interval placement of central venous catheter from right internal jugular approach, tip overlying the expected location of cavoatrial junction. Enteric catheter transverses the thorax. Endotracheal tube terminates 1 cm above the carina. Withdrawal by 2 cm may be considered.  Cardiac silhouette is stably enlarged. There are diffusely right more than left interstitial and airspace opacities, not significantly changed from the prior radiograph. No evidence of pneumothorax. No evidence of focal airspace consolidation.  Osseous structures and soft tissues are grossly normal.  IMPRESSION: Placement of right central venous catheter, tip overlying expected location of cavoatrial junction. No evidence of pneumothorax.  Endotracheal tube terminates 1 cm above the carina, slight withdrawal may be considered.  Persistent  diffuse alveolar and interstitial opacities which may be seen with pulmonary edema. ARDS is of consideration.   Electronically Signed   By: Ted Mcalpine M.D.   On: 12/28/2014 11:18    ASSESSMENT / PLAN:  PULMONARY A: Acute hypoxic respiratory failure R > L pulmonary infiltrates Concern ARDS/ALI developing ?Pulmonary edema Doubt PE  P:   ABg reviewed ARDS protocol to goal 6 cc/kg as goal as pressures 30 ABg reviewed, keep rate 35 Major improvement in O2 needs, peep and fio2 reduction likely successful to minimal pcxr in am  Would consider paralysis to 48 hrs , for severe ards, however she has rapid improvements pcxr now and in am   CARDIOVASCULAR A:  Tachycardia, sinus Hypertension, now dropping Pleuritic chest pain P:  Telemetry monitoring Echo reviewed, will need repeat echo with pa pressures when off vent as outpt, osa? With proper sedation, BP resolved wnl  RENAL A:   AKI - worsening Hypokalemia High AG metabolic acidosis Pyelonephritis bilateral?  P:   Follow Bmet to am Allow pos balance further LA re assuring Dc bicarb as ards ph less 7.25 resolved  GASTROINTESTINAL  A:   Abdominal pain Sepsis P:   Check amylase, lipase - wnl ppi Feeds oxepa  HEMATOLOGIC A:   Anemia dvt prevention P:  Follow CBC am  sub q heparin  INFECTIOUS A:   Severe sepsis secondary to pyelonephritis.   P:   BCx2 6/28 >>> Abx: pip/tazo, start date 6/26 >>> Abx: vancomycin, start date 6/26 >>>  Repeat US no gas or NEW hydro Keep regimen, narrow ion am likely  ENDOCRINE A:   Probable DM 2 rel AI hyperglcyemia P:   CBG monitoring and SSI tsh Crtisol, stress roids- reduce as off pressors lantus add SSI  NEUROLOGIC A:   Severe ARDS Rapid resolving P:   RASS goal: -2 Monitor closely Dc paralysis Dc zoloft as risk Ser syndrome with fent likley reduce versed later , rass-2  FAMILY  - Updates: Dr Delton CoombesByrum discussed current issues, critical status  with pt's Mom and family at bedside 6/28.  -updated family DR 6/28 and 6/29  - Inter-disciplinary family meet or Palliative Care meeting due by:  7/5  Updated mom  Ccm time additional 45 min   Mcarthur Rossettianiel J. Tyson AliasFeinstein, MD, FACP Pgr: [phone] [facility]

## 2014-12-29 NOTE — Progress Notes (Signed)
eLink Physician-Brief Progress Note Patient Name: Donna ChristenJanai Knobloch DOB: 1995-11-02 MRN: [id number]   Date of Service  12/29/2014  HPI/Events of Note  K+ = 3.5 and Creatinine = 1.86.  eICU Interventions  Will cautiously replete K+.     Intervention Category Intermediate Interventions: Electrolyte abnormality - evaluation and management  Destane Speas [place] 12/29/2014, 6:36 AM

## 2014-12-29 NOTE — Progress Notes (Signed)
Inpatient Diabetes Program Recommendations  AACE/ADA: New Consensus Statement on Inpatient Glycemic Control (2013)  Target Ranges:  Prepandial:   less than 140 mg/dL      Peak postprandial:   less than 180 mg/dL (1-2 hours)      Critically ill patients:  140 - 180 mg/dL   Inpatient Diabetes Program Recommendations Correction (SSI): change to Q4  Insulin - Meal Coverage: add Novolog 3 units Q4 for tube feed coverage  Thank you  Piedad ClimesGina Imaad Reuss BSN, RN,CDE Inpatient Diabetes Coordinator [phone] (team pager)

## 2014-12-30 ENCOUNTER — Inpatient Hospital Stay (HOSPITAL_COMMUNITY): Payer: Medicaid Other

## 2014-12-30 DIAGNOSIS — J9601 Acute respiratory failure with hypoxia: Secondary | ICD-10-CM

## 2014-12-30 LAB — COMPREHENSIVE METABOLIC PANEL
ALT: 14 U/L (ref 14–54)
ANION GAP: 7 (ref 5–15)
AST: 15 U/L (ref 15–41)
Albumin: 1.6 g/dL — ABNORMAL LOW (ref 3.5–5.0)
Alkaline Phosphatase: 89 U/L (ref 38–126)
BILIRUBIN TOTAL: 0.5 mg/dL (ref 0.3–1.2)
BUN: 26 mg/dL — ABNORMAL HIGH (ref 6–20)
CO2: 28 mmol/L (ref 22–32)
Calcium: 7.4 mg/dL — ABNORMAL LOW (ref 8.9–10.3)
Chloride: 111 mmol/L (ref 101–111)
Creatinine, Ser: 1.82 mg/dL — ABNORMAL HIGH (ref 0.44–1.00)
GFR calc Af Amer: 45 mL/min — ABNORMAL LOW (ref >60)
GFR calc non Af Amer: 39 mL/min — ABNORMAL LOW (ref >60)
GLUCOSE: 249 mg/dL — AB (ref 65–99)
Potassium: 3.7 mmol/L (ref 3.5–5.1)
Sodium: 146 mmol/L — ABNORMAL HIGH (ref 135–145)
TOTAL PROTEIN: 5.4 g/dL — AB (ref 6.5–8.1)

## 2014-12-30 LAB — GLUCOSE, CAPILLARY
GLUCOSE-CAPILLARY: 224 mg/dL — AB (ref 65–99)
GLUCOSE-CAPILLARY: 225 mg/dL — AB (ref 65–99)
Glucose-Capillary: 215 mg/dL — ABNORMAL HIGH (ref 65–99)
Glucose-Capillary: 225 mg/dL — ABNORMAL HIGH (ref 65–99)
Glucose-Capillary: 280 mg/dL — ABNORMAL HIGH (ref 65–99)
Glucose-Capillary: 292 mg/dL — ABNORMAL HIGH (ref 65–99)
Glucose-Capillary: 276 mg/dL — ABNORMAL HIGH (ref 65–99)

## 2014-12-30 LAB — CBC WITH DIFFERENTIAL/PLATELET
BASOS ABS: 0 10*3/uL (ref 0.0–0.1)
Basophils Relative: 0 % (ref 0–1)
Eosinophils Absolute: 0 10*3/uL (ref 0.0–0.7)
Eosinophils Relative: 0 % (ref 0–5)
HCT: 25 % — ABNORMAL LOW (ref 36.0–46.0)
Hemoglobin: 8.2 g/dL — ABNORMAL LOW (ref 12.0–15.0)
LYMPHS PCT: 12 % (ref 12–46)
Lymphs Abs: 2.1 10*3/uL (ref 0.7–4.0)
MCH: 25.6 pg — ABNORMAL LOW (ref 26.0–34.0)
MCHC: 32.8 g/dL (ref 30.0–36.0)
MCV: 78.1 fL (ref 78.0–100.0)
MONOS PCT: 7 % (ref 3–12)
Monocytes Absolute: 1.2 10*3/uL — ABNORMAL HIGH (ref 0.1–1.0)
NEUTROS PCT: 81 % — AB (ref 43–77)
Neutro Abs: 14.5 10*3/uL — ABNORMAL HIGH (ref 1.7–7.7)
PLATELETS: 288 10*3/uL (ref 150–400)
RBC: 3.2 MIL/uL — ABNORMAL LOW (ref 3.87–5.11)
RDW: 16.1 % — ABNORMAL HIGH (ref 11.5–15.5)
WBC: 17.8 10*3/uL — ABNORMAL HIGH (ref 4.0–10.5)

## 2014-12-30 LAB — BLOOD GAS, ARTERIAL
ACID-BASE EXCESS: 1.4 mmol/L (ref 0.0–2.0)
Acid-Base Excess: 2.4 mmol/L — ABNORMAL HIGH (ref 0.0–2.0)
Bicarbonate: 27.2 mEq/L — ABNORMAL HIGH (ref 20.0–24.0)
Bicarbonate: 27.8 mEq/L — ABNORMAL HIGH (ref 20.0–24.0)
Drawn by: 270271
Drawn by: 331761
FIO2: 0.55 %
FIO2: 0.7 %
MECHVT: 300 mL
O2 SAT: 90.4 %
O2 SAT: 99.5 %
PATIENT TEMPERATURE: 98.8
PCO2 ART: 56.9 mmHg — AB (ref 35.0–45.0)
PEEP: 10 cmH2O
PEEP: 12 cmH2O
PH ART: 7.3 — AB (ref 7.350–7.450)
PH ART: 7.327 — AB (ref 7.350–7.450)
Patient temperature: 98.7
Pressure control: 16 cmH2O
RATE: 35 resp/min
RATE: 20 resp/min
TCO2: 28.9 mmol/L (ref 0–100)
TCO2: 29.4 mmol/L (ref 0–100)
pCO2 arterial: 54.7 mmHg — ABNORMAL HIGH (ref 35.0–45.0)
pO2, Arterial: 66.8 mmHg — ABNORMAL LOW (ref 80.0–100.0)
pO2, Arterial: 232 mmHg — ABNORMAL HIGH (ref 80.0–100.0)

## 2014-12-30 LAB — URINE CULTURE: Culture: NO GROWTH

## 2014-12-30 MED ORDER — DEXTROSE 5 % IV SOLN
1.0000 g | Freq: Three times a day (TID) | INTRAVENOUS | Status: DC
  Administered 2014-12-30 – 2015-01-01 (×6): 1 g via INTRAVENOUS
  Filled 2014-12-30 (×8): qty 1

## 2014-12-30 MED ORDER — ROCURONIUM BROMIDE 50 MG/5ML IV SOLN
50.0000 mg | Freq: Once | INTRAVENOUS | Status: AC
  Administered 2014-12-30: 50 mg via INTRAVENOUS

## 2014-12-30 MED ORDER — ACETAMINOPHEN 325 MG PO TABS
650.0000 mg | ORAL_TABLET | Freq: Four times a day (QID) | ORAL | Status: DC | PRN
  Administered 2014-12-31 – 2015-01-04 (×3): 650 mg
  Filled 2014-12-30 (×3): qty 2

## 2014-12-30 MED ORDER — FAMOTIDINE 40 MG/5ML PO SUSR
20.0000 mg | Freq: Every day | ORAL | Status: DC
  Administered 2014-12-30 – 2014-12-31 (×2): 20 mg
  Filled 2014-12-30 (×2): qty 2.5

## 2014-12-30 MED ORDER — ETOMIDATE 2 MG/ML IV SOLN
20.0000 mg | Freq: Once | INTRAVENOUS | Status: AC
  Administered 2014-12-30: 20 mg via INTRAVENOUS

## 2014-12-30 MED ORDER — FREE WATER
200.0000 mL | Freq: Three times a day (TID) | Status: DC
  Administered 2014-12-30 – 2014-12-31 (×3): 200 mL

## 2014-12-30 NOTE — Progress Notes (Addendum)
ANTIBIOTIC CONSULT NOTE - FOLLOW UP  Pharmacy Consult for vancomycin/zosyn Indication: sepsis  No Known Allergies  Patient Measurements: Height:  (157.5 cm) Weight: 263 lb 0.1 oz (119.3 kg) IBW/kg (Calculated) : 50.1  Vital Signs: Temp: 98.5 F (36.9 C) (06/30 0736) Temp Source: Oral (06/30 0736) BP: 152/80 mmHg (06/30 0300) Pulse Rate: 96 (06/30 0900) Intake/Output from previous day: 06/29 0701 - 06/30 0700 In: 3017.2 [I.V.:1097.2; NG/GT:1470; IV Piggyback:450] Out: 1955 [Urine:1955] Intake/Output from this shift: Total I/O In: 172 [I.V.:52; NG/GT:120] Out: -   Labs:  Recent Labs  12/28/14 1129  12/29/14 0230 12/29/14 1024 12/30/14 0430  WBC 27.7*  --  21.2*  --  17.8*  HGB 9.7*  --  8.1*  --  8.2*  PLT 294  --  256  --  288  CREATININE 2.23*  < > 1.86* 1.71* 1.82*  < > = values in this interval not displayed. Estimated Creatinine Clearance: 60.1 mL/min (by C-G formula based on Cr of 1.82). No results for input(s): VANCOTROUGH, VANCOPEAK, VANCORANDOM, GENTTROUGH, GENTPEAK, GENTRANDOM, TOBRATROUGH, TOBRAPEAK, TOBRARND, AMIKACINPEAK, AMIKACINTROU, AMIKACIN in the last 72 hours.   Microbiology: Recent Results (from the past 720 hour(s))  MRSA PCR Screening     Status: None   Collection Time: 12/27/14  1:44 AM  Result Value Ref Range Status   MRSA by PCR NEGATIVE NEGATIVE Final    Comment:        The GeneXpert MRSA Assay (FDA approved for NASAL specimens only), is one component of a comprehensive MRSA colonization surveillance program. It is not intended to diagnose MRSA infection nor to guide or monitor treatment for MRSA infections.   Culture, blood (routine x 2)     Status: None (Preliminary result)   Collection Time: 12/27/14  4:17 AM  Result Value Ref Range Status   Specimen Description BLOOD LEFT ANTECUBITAL  Final   Special Requests BOTTLES DRAWN AEROBIC ONLY 5CC  Final   Culture NO GROWTH 2 DAYS  Final   Report Status PENDING  Incomplete   Culture, blood (routine x 2)     Status: None (Preliminary result)   Collection Time: 12/27/14  4:20 AM  Result Value Ref Range Status   Specimen Description BLOOD RIGHT HAND  Final   Special Requests BOTTLES DRAWN AEROBIC ONLY 4CC  Final   Culture NO GROWTH 2 DAYS  Final   Report Status PENDING  Incomplete  Culture, respiratory (NON-Expectorated)     Status: None (Preliminary result)   Collection Time: 12/28/14  6:10 PM  Result Value Ref Range Status   Specimen Description TRACHEAL ASPIRATE  Final   Special Requests NONE  Final   Gram Stain   Final    MODERATE WBC PRESENT, PREDOMINANTLY MONONUCLEAR NO SQUAMOUS EPITHELIAL CELLS SEEN NO ORGANISMS SEEN Performed at [facility]    Culture   Final    NO GROWTH 1 DAY Performed at [facility]    Report Status PENDING  Incomplete    Anti-infectives    Start     Dose/Rate Route Frequency Ordered Stop   12/28/14 0300  vancomycin (VANCOCIN) 1,250 mg in sodium chloride 0.9 % 250 mL IVPB     1,250 mg 166.7 mL/hr over 90 Minutes Intravenous Every 24 hours 12/27/14 0207     12/27/14 1300  piperacillin-tazobactam (ZOSYN) IVPB 3.375 g     3.375 g 12.5 mL/hr over 240 Minutes Intravenous Every 8 hours 12/27/14 1152     12/27/14 0215  vancomycin (VANCOCIN) 2,000 mg  in sodium chloride 0.9 % 500 mL IVPB     2,000 mg 250 mL/hr over 120 Minutes Intravenous  Once 12/27/14 0207 12/27/14 0434   12/26/14 2330  metroNIDAZOLE (FLAGYL) IVPB 500 mg  Status:  Discontinued     500 mg 100 mL/hr over 60 Minutes Intravenous Every 8 hours 12/26/14 2317 12/27/14 0200   12/26/14 2315  piperacillin-tazobactam (ZOSYN) IVPB 3.375 g     3.375 g 100 mL/hr over 30 Minutes Intravenous  Once 12/26/14 2304 12/27/14 0540   12/26/14 2300  vancomycin (VANCOCIN) IVPB 1000 mg/200 mL premix  Status:  Discontinued     1,000 mg 200 mL/hr over 60 Minutes Intravenous  Once 12/26/14 2252 12/27/14 0207   12/26/14 1830  cefTRIAXone (ROCEPHIN) 1 g in dextrose 5 %  50 mL IVPB  Status:  Discontinued     1 g 100 mL/hr over 30 Minutes Intravenous Every 24 hours 12/26/14 1828 12/27/14 0200      Assessment: 19 yo female on day 4 of vancomycin/zosyn for sepsis secondary to pyleonephritis. WBC =17.2 (down), afeb, SCr= 1.82, and CrCl ~ 60. LA= 2.07>>0.7, PCT= 2.18>>1.43. Noted plans for possibly narrowing antibiotics today.   6/27 vanc>> 6/27 zosyn>>  6/27 blood x2- ngtd 6/28 resp- ngtd 6/29 urine  Goal of Therapy:  Vancomycin trough level 15-20 mcg/ml  Plan:  -No antibiotic dose changes needed -Will consider a vancomycin trough on 7/1 if vancomycin continues -Will follow renal function, cultures and clinical progress  Harland Germanndrew Lajean Boese, Pharm D 12/30/2014 9:36 AM  Addendum: fortaz -vancomycin/zosyn d/c and change to fortaz  Plan -Elita QuickFortaz 1gm IV q8h -Will follow patient progress  Harland Germanndrew Katheleen Stella, Pharm D 12/30/2014 1:10 PM

## 2014-12-30 NOTE — Progress Notes (Signed)
Mother/poa Felicia Holt came by and wanted letter sent to pt's employer so pt would not loose her job.generic letter sent to [employer] for pt per request of poa. Pt on vent and not alert at present.

## 2014-12-30 NOTE — Consult Note (Signed)
PULMONARY / CRITICAL CARE MEDICINE   Name: Felicia Holt MRN: [id number] DOB: 1996-05-15    ADMISSION DATE:  12/26/2014 CONSULTATION DATE:  12/28/2014  REFERRING MD :  Dr. Mahala Menghini  CHIEF COMPLAINT:  SOB  INITIAL PRESENTATION: 19 year old female admitted with flank and abdominal pain x 1 week. She was admitted for sepsis secondary to suspected pyelonephritis after CT scan. She was placed on broad spectrum antibiotics. 6/28 early AM she became hypoxemic requiring BiPAP. CXR revealed new R > L infiltrates. PCCM consulted.   STUDIES:  6/26 CT abd/pelvis: Bilateral pyelonephritis. Both kidneys are diffusely edematous with multi focal areas of abnormal low-density in both kidneys. New hepatomegaly. New edema of the gallbladder wall. No visible stones. The gallbladder is not distended. 6/28 US renal>>>Heterogeneity of the kidneys consistent with the finding seen on recent CT, Gallbladder wall edema without definitive cholelithiasis. 6/28 dopplers>>>neg lowers 6/28 echo>>>65%, PA 52   SIGNIFICANT EVENTS: 6/28 ett placed, ALI, shock, MODS 6/30 self extubated requiring immediate re-intubation  SUBJECTIVE:  Intermittent agitation. RASS -3 to +3.   VITAL SIGNS: Temp:  [98 F (36.7 C)-99 F (37.2 C)] 98 F (36.7 C) (06/30 1156) Pulse Rate:  [69-102] 97 (06/30 1613) Resp:  [0-42] 23 (06/30 1613) BP: (116-152)/(49-80) 149/77 mmHg (06/30 1613) SpO2:  [93 %-100 %] 100 % (06/30 1613) Arterial Line BP: (108-170)/(43-77) 152/75 mmHg (06/30 1600) FiO2 (%):  [50 %-70 %] 70 % (06/30 1613) Weight:  [119.3 kg (263 lb 0.1 oz)] 119.3 kg (263 lb 0.1 oz) (06/30 0500) HEMODYNAMICS:   VENTILATOR SETTINGS: Vent Mode:  [-] PCV FiO2 (%):  [50 %-70 %] 70 % Set Rate:  [20 bmp-35 bmp] 20 bmp Vt Set:  [300 mL] 300 mL PEEP:  [10 cmH20-12 cmH20] 12 cmH20 Plateau Pressure:  [21 cmH20-29 cmH20] 29 cmH20 INTAKE / OUTPUT:  Intake/Output Summary (Last 24 hours) at 12/30/14 1712 Last data filed at 12/30/14  1600  Gross per 24 hour  Intake 2337.25 ml  Output   1440 ml  Net 897.25 ml    PHYSICAL EXAMINATION: General: intermittently agitated. Obese Neuro:  RASS -3 to +3. MAEs HEENT: B conjunctival hemorrhage, otherwise WNL  Cardiovascular: Reg, no M Lungs: no wheezes Abdomen: Soft, +BS Ext: no edema, warm  LABS:  CBC  Recent Labs Lab 12/28/14 1129 12/29/14 0230 12/30/14 0430  WBC 27.7* 21.2* 17.8*  HGB 9.7* 8.1* 8.2*  HCT 29.1* 24.0* 25.0*  PLT 294 256 288   Coag's  Recent Labs Lab 12/28/14 0142 12/28/14 1129  APTT  --  37  INR 1.31 1.30   BMET  Recent Labs Lab 12/29/14 0230 12/29/14 1024 12/30/14 0430  NA 143 143 146*  K 3.5 3.6 3.7  CL 110 109 111  CO2 BUN 19 20 26*  CREATININE 1.86* 1.71* 1.82*  GLUCOSE 288* 375* 249*   Electrolytes  Recent Labs Lab 12/29/14 0230 12/29/14 1024 12/30/14 0430  CALCIUM 6.8* 6.9* 7.4*   Sepsis Markers  Recent Labs Lab 12/26/14 2154 12/28/14 0142 12/28/14 1127 12/29/14 0230  LATICACIDVEN 2.07* 1.0 0.7  --   PROCALCITON  --  2.18  --  1.43   ABG  Recent Labs Lab 12/29/14 0356 12/29/14 1101 12/30/14 0521  PHART 7.388 7.282* 7.300*  PCO2ART 41.4 52.9* 56.9*  PO2ART 299.0* 102.0* 66.8*   Liver Enzymes  Recent Labs Lab 12/28/14 1129 12/29/14 0230 12/30/14 0430  AST 14* 13* 15  ALT 18 13* 14  ALKPHOS 107 105 89  BILITOT 1.4*  1.1 0.5  ALBUMIN 1.7* 1.5* 1.6*   Cardiac Enzymes  Recent Labs Lab 12/28/14 1129  TROPONINI 0.09*   Glucose  Recent Labs Lab 12/29/14 1627 12/29/14 1956 12/29/14 2311 12/30/14 0426 12/30/14 0739 12/30/14 1159  GLUCAP 301* 255* 215* 224* 225* 225*    Imaging Dg Chest Port 1 View  12/30/2014   CLINICAL DATA:  Intubation.  ARDS.  EXAM: PORTABLE CHEST - 1 VIEW 4:04 p.m.  COMPARISON:  12/30/2014 at 5 a.m.  FINDINGS: Endotracheal tube is 18 mm above the carina. Central line and NG tube appear in good position. Persistent air bronchograms at the inferior  aspect of the left hilum. Resolved hazy infiltrate at the right lung base.  IMPRESSION: Resolved faint infiltrate on the right. Persistent consolidation at the inferior aspect of the left hilum.   Electronically Signed   By: Francene BoyersJames  Maxwell M.D.   On: 12/30/2014 16:22   Dg Chest Port 1 View  12/30/2014   CLINICAL DATA:  ARDS  EXAM: PORTABLE CHEST - 1 VIEW  COMPARISON:  12/29/2014  FINDINGS: The endotracheal tube is 2.5 cm above the carina. The nasogastric tube extends into the stomach. The right jugular central line extends into the SVC.  Ground-glass opacities persist in the central lung regions bilaterally. There is improvement, with partial clearance of left base consolidation, now with better definition of the diaphragmatic contour. There is no large effusion.  IMPRESSION: Support equipment appears satisfactorily positioned.  Improved, with partial clearance of left base opacity. Ground-glass opacities persist in both central lung regions.   Electronically Signed   By: Ellery Plunkaniel R Mitchell M.D.   On: 12/30/2014 06:29    ASSESSMENT / PLAN:  PULMONARY A: Acute hypoxic respiratory failure ARDS Failed self extubation 6/30 P:   Cont full vent support - settings reviewed and/or adjusted Cont vent bundle Daily SBT if/when meets criteria Changed to PCV mode for better synchrony 6/30   CARDIOVASCULAR A:  No issues P:  Monitor  RENAL A:   AKI, nonoliguric Hypokalemia, resolved Metabolic acidosis, resolved P:   Monitor BMET intermittently Monitor I/Os Correct electrolytes as indicated  GASTROINTESTINAL A:   Abdominal pain, resolved P:   SUP: enteral famotidine Cont TFs  HEMATOLOGIC A:   Anemia without overt bleeding P:  DVT px: SQ heparin Monitor CBC intermittently Transfuse per usual ICU guidelines  INFECTIOUS A:   Severe sepsis  Pyelonephritis P:   BCx2 6/28 >>> Abx: pip/tazo, start date 6/26 >>> Abx: vancomycin, start date 6/26 >> 6/30  ENDOCRINE A:    Hyperglycemia Possible relative AI P:   Cont SSI DC hydrocortisone 6/30  NEUROLOGIC A:   Agitation Acute encephalopathy P:   RASS goal: - 3 Cont PAD protocol Daily WUA  FAMILY: Mother updated @ bedside  Ccm time additional 45 min   Felicia Fischeravid Trampus Mcquerry, MD ; [facility] Mobile [phone].  After 5:30 PM or weekends, call [phone]

## 2014-12-30 NOTE — Progress Notes (Signed)
Pt self-extubated; bagged until RT & MD arrived.  Patient re-intubated successfully and confirmed ET tube and OG tube placement w/ e-link MD.  Will continue to monitor. [place], Felicia HansenJessica Holt

## 2014-12-30 NOTE — Procedures (Signed)
Intubation Procedure Note Felicia ChristenJanai Holt [id number] [birth year]  Procedure: Intubation Indications: Respiratory insufficiency  Procedure Details Consent: Unable to obtain consent because of emergent medical necessity. Time Out: Verified patient identification, verified procedure, site/side was marked, verified correct patient position, special equipment/implants available, medications/allergies/relevent history reviewed, required imaging and test results available.  Performed  Drugs:  100 mcg Fentanyl, 20 mg Etomidate, 50 mg Rocuronium. DL x 1 with # 3 MAC blade. Grade 2 view. 7.5 tube visualized passing through vocal cords. Following intubation:  positive color change on ETCO2, condensation seen in endotracheal tube, equal breath sounds bilaterally.  Evaluation Hemodynamic Status: BP stable throughout; O2 sats: stable throughout Patient's Current Condition: stable Complications: No apparent complications Patient did tolerate procedure well. Chest X-ray ordered to verify placement.  CXR: pending.   Rutherford Guysahul Desai, PA - [facility] Pager: [phone]  or [phone]  I was present and supervised the entire procedure.  12/30/2014, 3:45 PM

## 2014-12-31 ENCOUNTER — Inpatient Hospital Stay (HOSPITAL_COMMUNITY): Payer: Medicaid Other

## 2014-12-31 LAB — COMPREHENSIVE METABOLIC PANEL
ALT: 15 U/L (ref 14–54)
AST: 15 U/L (ref 15–41)
Albumin: 1.6 g/dL — ABNORMAL LOW (ref 3.5–5.0)
Alkaline Phosphatase: 95 U/L (ref 38–126)
Anion gap: 8 (ref 5–15)
BUN: 30 mg/dL — ABNORMAL HIGH (ref 6–20)
CHLORIDE: 113 mmol/L — AB (ref 101–111)
CO2: 31 mmol/L (ref 22–32)
Calcium: 8.2 mg/dL — ABNORMAL LOW (ref 8.9–10.3)
Creatinine, Ser: 1.51 mg/dL — ABNORMAL HIGH (ref 0.44–1.00)
GFR calc Af Amer: 56 mL/min — ABNORMAL LOW (ref >60)
GFR calc non Af Amer: 49 mL/min — ABNORMAL LOW (ref >60)
Glucose, Bld: 217 mg/dL — ABNORMAL HIGH (ref 65–99)
POTASSIUM: 3.5 mmol/L (ref 3.5–5.1)
SODIUM: 152 mmol/L — AB (ref 135–145)
Total Bilirubin: 0.4 mg/dL (ref 0.3–1.2)
Total Protein: 5.8 g/dL — ABNORMAL LOW (ref 6.5–8.1)

## 2014-12-31 LAB — GLUCOSE, CAPILLARY
Glucose-Capillary: 193 mg/dL — ABNORMAL HIGH (ref 65–99)
Glucose-Capillary: 199 mg/dL — ABNORMAL HIGH (ref 65–99)
Glucose-Capillary: 200 mg/dL — ABNORMAL HIGH (ref 65–99)
Glucose-Capillary: 200 mg/dL — ABNORMAL HIGH (ref 65–99)
Glucose-Capillary: 213 mg/dL — ABNORMAL HIGH (ref 65–99)
Glucose-Capillary: 217 mg/dL — ABNORMAL HIGH (ref 65–99)

## 2014-12-31 LAB — CULTURE, RESPIRATORY: CULTURE: NO GROWTH

## 2014-12-31 LAB — CBC
HCT: 25.7 % — ABNORMAL LOW (ref 36.0–46.0)
Hemoglobin: 8.2 g/dL — ABNORMAL LOW (ref 12.0–15.0)
MCH: 25.6 pg — ABNORMAL LOW (ref 26.0–34.0)
MCHC: 31.9 g/dL (ref 30.0–36.0)
MCV: 80.3 fL (ref 78.0–100.0)
PLATELETS: 313 10*3/uL (ref 150–400)
RBC: 3.2 MIL/uL — AB (ref 3.87–5.11)
RDW: 16.5 % — AB (ref 11.5–15.5)
WBC: 15.8 10*3/uL — ABNORMAL HIGH (ref 4.0–10.5)

## 2014-12-31 LAB — CULTURE, RESPIRATORY W GRAM STAIN

## 2014-12-31 MED ORDER — FAMOTIDINE 40 MG/5ML PO SUSR
20.0000 mg | Freq: Two times a day (BID) | ORAL | Status: DC
  Administered 2014-12-31 – 2015-01-06 (×12): 20 mg
  Filled 2014-12-31 (×14): qty 2.5

## 2014-12-31 MED ORDER — INSULIN GLARGINE 100 UNIT/ML ~~LOC~~ SOLN
20.0000 [IU] | Freq: Every day | SUBCUTANEOUS | Status: DC
  Administered 2015-01-01 – 2015-01-07 (×7): 20 [IU] via SUBCUTANEOUS
  Filled 2014-12-31 (×7): qty 0.2

## 2014-12-31 MED ORDER — POTASSIUM CHLORIDE 20 MEQ/15ML (10%) PO SOLN
20.0000 meq | ORAL | Status: AC
  Administered 2014-12-31 (×2): 20 meq
  Filled 2014-12-31 (×2): qty 15

## 2014-12-31 MED ORDER — CLONAZEPAM 1 MG PO TABS
1.0000 mg | ORAL_TABLET | Freq: Two times a day (BID) | ORAL | Status: DC
  Administered 2014-12-31 – 2015-01-02 (×6): 1 mg
  Filled 2014-12-31 (×6): qty 1

## 2014-12-31 MED ORDER — DEXTROSE 5 % IV SOLN
INTRAVENOUS | Status: DC
  Administered 2014-12-31 – 2015-01-04 (×6): via INTRAVENOUS

## 2014-12-31 MED ORDER — FENTANYL CITRATE (PF) 100 MCG/2ML IJ SOLN
50.0000 ug | INTRAMUSCULAR | Status: DC | PRN
  Administered 2014-12-31: 200 ug via INTRAVENOUS
  Administered 2014-12-31: 100 ug via INTRAVENOUS
  Filled 2014-12-31: qty 4
  Filled 2014-12-31: qty 2

## 2014-12-31 MED ORDER — FENTANYL CITRATE (PF) 100 MCG/2ML IJ SOLN
25.0000 ug | INTRAMUSCULAR | Status: DC | PRN
  Administered 2014-12-31: 100 ug via INTRAVENOUS
  Filled 2014-12-31: qty 2

## 2014-12-31 MED ORDER — DEXMEDETOMIDINE HCL IN NACL 400 MCG/100ML IV SOLN
0.4000 ug/kg/h | INTRAVENOUS | Status: DC
  Administered 2014-12-31: 0.8 ug/kg/h via INTRAVENOUS
  Administered 2014-12-31 (×2): 0.4 ug/kg/h via INTRAVENOUS
  Administered 2015-01-01: 1.2 ug/kg/h via INTRAVENOUS
  Administered 2015-01-01: 0.8 ug/kg/h via INTRAVENOUS
  Filled 2014-12-31 (×5): qty 100

## 2014-12-31 MED ORDER — HALOPERIDOL LACTATE 5 MG/ML IJ SOLN
5.0000 mg | INTRAMUSCULAR | Status: AC
  Administered 2014-12-31: 5 mg via INTRAVENOUS
  Filled 2014-12-31: qty 1

## 2014-12-31 MED ORDER — MIDAZOLAM HCL 2 MG/2ML IJ SOLN
1.0000 mg | INTRAMUSCULAR | Status: DC | PRN
  Administered 2014-12-31 – 2015-01-01 (×4): 4 mg via INTRAVENOUS
  Administered 2015-01-03: 2 mg via INTRAVENOUS
  Filled 2014-12-31: qty 2
  Filled 2014-12-31 (×3): qty 4
  Filled 2014-12-31: qty 2
  Filled 2014-12-31: qty 4

## 2014-12-31 MED ORDER — CLONAZEPAM 0.1 MG/ML ORAL SUSPENSION
1.0000 mg | Freq: Two times a day (BID) | ORAL | Status: DC
  Filled 2014-12-31 (×2): qty 10

## 2014-12-31 MED ORDER — FREE WATER
200.0000 mL | Status: DC
  Administered 2014-12-31 – 2015-01-06 (×36): 200 mL

## 2014-12-31 NOTE — Progress Notes (Signed)
eLink Physician-Brief Progress Note Patient Name: Felicia ChristenJanai Pollan DOB: 01-Dec-1995 MRN: [id number]  Date of Service  12/31/2014   HPI/Events of Note   Agitated. Last Qtc < 500msec per RN  eICU Interventions  Haldol 5mg  IV stat   Intervention Category Major Interventions: Acid-Base disturbance - evaluation and management Intermediate Interventions: Diagnostic test evaluation  Alanzo Lamb 12/31/2014, 8:24 PM

## 2014-12-31 NOTE — Progress Notes (Signed)
100 ml Fentanyl and 15 ml Versed wasted with Selinda FlavinKristina Wolfe, RN at this time.

## 2014-12-31 NOTE — Consult Note (Signed)
PULMONARY / CRITICAL CARE MEDICINE   Name: Donna ChristenJanai Muriel MRN: [id number] DOB: 02-Jan-1996    ADMISSION DATE:  12/26/2014 CONSULTATION DATE:  12/28/2014  REFERRING MD :  Dr. Mahala MenghiniSamtani  CHIEF COMPLAINT:  SOB  INITIAL PRESENTATION: 19 year old female admitted with flank and abdominal pain x 1 week. She was admitted for sepsis secondary to suspected pyelonephritis after CT scan. She was placed on broad spectrum antibiotics. 6/28 early AM she became hypoxemic requiring BiPAP. CXR revealed new R > L infiltrates. PCCM consulted.   STUDIES:  6/26 CT abd/pelvis: Bilateral pyelonephritis. Both kidneys are diffusely edematous with multi focal areas of abnormal low-density in both kidneys. New hepatomegaly. New edema of the gallbladder wall. No visible stones. The gallbladder is not distended. 6/28 US renal>>>Heterogeneity of the kidneys consistent with the finding seen on recent CT, Gallbladder wall edema without definitive cholelithiasis. 6/28 dopplers>>>neg lowers 6/28 echo>>>65%, PA 52   SIGNIFICANT EVENTS: 6/28 ett placed, ALI, shock, MODS 6/30 self extubated requiring immediate re-intubation 7/01 Tolerated PS 5-10 cm H2O. Too somnolent to extubate. Dexmedetomidine initiated  SUBJECTIVE:  RASS -3. Not F/C  VITAL SIGNS: Temp:  [98 F (36.7 C)-100.5 F (38.1 C)] 100.5 F (38.1 C) (07/01 1629) Pulse Rate:  [74-112] 97 (07/01 1629) Resp:  [0-32] 17 (07/01 1600) BP: (114-152)/(50-74) 146/62 mmHg (07/01 1535) SpO2:  [97 %-100 %] 97 % (07/01 1629) Arterial Line BP: (110-175)/(46-88) 144/60 mmHg (07/01 1600) FiO2 (%):  [40 %-60 %] 60 % (07/01 1535) Weight:  [118.4 kg (261 lb 0.4 oz)] 118.4 kg (261 lb 0.4 oz) (07/01 0300) HEMODYNAMICS: CVP:  [14 mmHg] 14 mmHg VENTILATOR SETTINGS: Vent Mode:  [-] PCV FiO2 (%):  [40 %-60 %] 60 % Set Rate:  [14 bmp-20 bmp] 14 bmp PEEP:  [5 cmH20-12 cmH20] 5 cmH20 Pressure Support:  [12 cmH20-15 cmH20] 15 cmH20 Plateau Pressure:  [21 cmH20-28 cmH20] 25  cmH20 INTAKE / OUTPUT:  Intake/Output Summary (Last 24 hours) at 12/31/14 1657 Last data filed at 12/31/14 1600  Gross per 24 hour  Intake 2605.4 ml  Output   2185 ml  Net  420.4 ml    PHYSICAL EXAMINATION: General: NAD. Obese Neuro:  RASS -3. MAEs HEENT: B conjunctival hemorrhage, otherwise WNL  Cardiovascular: Reg, no M Lungs: no wheezes Abdomen: Soft, +BS Ext: no edema, warm  LABS:  CBC  Recent Labs Lab 12/29/14 0230 12/30/14 0430 12/31/14 0400  WBC 21.2* 17.8* 15.8*  HGB 8.1* 8.2* 8.2*  HCT 24.0* 25.0* 25.7*  PLT 256 288 313   Coag's  Recent Labs Lab 12/28/14 0142 12/28/14 1129  APTT  --  37  INR 1.31 1.30   BMET  Recent Labs Lab 12/29/14 1024 12/30/14 0430 12/31/14 0400  NA 143 146* 152*  K 3.6 3.7 3.5  CL 109 111 113*  CO2 26 28 31   BUN 20 26* 30*  CREATININE 1.71* 1.82* 1.51*  GLUCOSE 375* 249* 217*   Electrolytes  Recent Labs Lab 12/29/14 1024 12/30/14 0430 12/31/14 0400  CALCIUM 6.9* 7.4* 8.2*   Sepsis Markers  Recent Labs Lab 12/26/14 2154 12/28/14 0142 12/28/14 1127 12/29/14 0230  LATICACIDVEN 2.07* 1.0 0.7  --   PROCALCITON  --  2.18  --  1.43   ABG  Recent Labs Lab 12/29/14 1101 12/30/14 0521 12/30/14 1741  PHART 7.282* 7.300* 7.327*  PCO2ART 52.9* 56.9* 54.7*  PO2ART 102.0* 66.8* 232*   Liver Enzymes  Recent Labs Lab 12/29/14 0230 12/30/14 0430 12/31/14 0400  AST 13* 15 15  ALT 13* 14 15  ALKPHOS 105 89 95  BILITOT 1.1 0.5 0.4  ALBUMIN 1.5* 1.6* 1.6*   Cardiac Enzymes  Recent Labs Lab 12/28/14 1129  TROPONINI 0.09*   Glucose  Recent Labs Lab 12/30/14 1712 12/30/14 1945 12/30/14 2326 12/31/14 0345 12/31/14 0804 12/31/14 1138  GLUCAP 280* 292* 276* 193* 213* 199*    CXR: clearing pulmonary infiltrates   ASSESSMENT / PLAN:  PULMONARY A: Acute hypoxic respiratory failure ARDS, radiographically resolving Failed self extubation 6/30 P:   Cont vent support - settings reviewed  and/or adjusted Wean in PSV as tolerated Cont vent bundle Daily SBT if/when meets criteria  CARDIOVASCULAR A:  No issues P:  Monitor  RENAL A:   AKI, nonoliguric - improving Hypokalemia, resolved Metabolic acidosis, resolved Hypernatremia P:   Monitor BMET intermittently Monitor I/Os Correct electrolytes as indicated Increase free water 7/01  GASTROINTESTINAL A:   Abdominal pain, resolved P:   SUP: enteral famotidine Cont TFs  HEMATOLOGIC A:   Anemia without overt bleeding P:  DVT px: SQ heparin Monitor CBC intermittently Transfuse per usual ICU guidelines  INFECTIOUS A:   Severe sepsis  Pyelonephritis, no organism identified P:   Urine 6/29 >> NEG Resp 6/28 >> NEG Blood 6/27 >> NEG  Vanc 6/26 >> 6/30 Pip/tazo 6/26 >> 6/30 Ceftaz 6/30 >>   ENDOCRINE A:   Hyperglycemia P:   Cont SSI  NEUROLOGIC A:   Agitation Acute encephalopathy P:   RASS goal: - 1 dexmedtomidine initiated 7/01 Change fent to intermittent 7/01 Low dose scheduled clonazepam initiated 7/01 Daily WUA  FAMILY: Mother updated @ bedside  Ccm time additional 45 min   Billy Fischer, MD ; [facility] service Mobile [phone].  After 5:30 PM or weekends, call [phone]

## 2014-12-31 NOTE — Progress Notes (Signed)
Elink: 3:27 PM @ 12/31/2014   eLink Physician-Brief Progress Note Patient Name: Donna ChristenJanai Bisping DOB: 1996/06/07 MRN: [id number]  Date of Service  12/31/2014   HPI/Events of Note   Patient got agitated on precdex 0.4 but improved with fent bolus   eICU Interventions  Increase precedex gtt usine fent prn Start versed prn   Intervention Category  Marymargaret Kirker 12/31/2014, 3:27 PM

## 2014-12-31 NOTE — Progress Notes (Signed)
Inpatient Diabetes Program Recommendations  AACE/ADA: New Consensus Statement on Inpatient Glycemic Control (2013)  Target Ranges:  Prepandial:   less than 140 mg/dL      Peak postprandial:   less than 180 mg/dL (1-2 hours)      Critically ill patients:  140 - 180 mg/dL   Inpatient Diabetes Program Recommendations Correction (SSI): . Insulin - Meal Coverage: add Novolog 3 units Q4 for tube feed coverage  Thank you  Piedad ClimesGina Maribella Kuna BSN, RN,CDE Inpatient Diabetes Coordinator [phone] (team pager)

## 2014-12-31 NOTE — Progress Notes (Signed)
[facility] FOR AM LAB REPLACEMENT ONLY  The patient does apply for the [facility] Electrolyte Re[facility] Protocol based on the criteria listed below:   1. Is GFR >/= 40 ml/min? Yes.    Patient's GFR today is 56 2. Is urine output >/= 0.5 ml/kg/hr for the last 6 hours? Yes.   Patient's UOP is 0.65 ml/kg/hr 3. Is BUN < 60 mg/dL? Yes.    Patient's BUN today is 30 4. Abnormal electrolyte(s): Potassium 3.5 5. Ordered repletion with: [facility] Adult ICU replacement protocol 6. If a panic level lab has been reported, has the CCM MD in charge been notified? Yes.  .   Physician:  Dr. Loletha GrayerSteven Sommer  [facility] HospitalRAMZAH, Alda BertholdYOUNKAI E 12/31/2014 6:35 AM

## 2014-12-31 NOTE — Progress Notes (Signed)
When RN turned patient to assess skin integrity, patient became very agitated requiring PRN fentanyl and increase in Precedex.  Patient continued to be agitated requiring PRN versed.  Patient still extremely agitated, MD gave RN one time order for Haldol.  Patient's mother witnessed some of the patient's agitation.  RN informed patient's family of medication that patient was given and the indication for patient receiving this medication, family stated they understood same. Ivery QualeJackson, Zaylan Kissoon A, RN

## 2015-01-01 ENCOUNTER — Inpatient Hospital Stay (HOSPITAL_COMMUNITY): Payer: Medicaid Other

## 2015-01-01 DIAGNOSIS — R1010 Upper abdominal pain, unspecified: Secondary | ICD-10-CM

## 2015-01-01 LAB — CBC
HCT: 24.3 % — ABNORMAL LOW (ref 36.0–46.0)
HEMOGLOBIN: 7.6 g/dL — AB (ref 12.0–15.0)
MCH: 25.6 pg — ABNORMAL LOW (ref 26.0–34.0)
MCHC: 31.3 g/dL (ref 30.0–36.0)
MCV: 81.8 fL (ref 78.0–100.0)
Platelets: 263 10*3/uL (ref 150–400)
RBC: 2.97 MIL/uL — AB (ref 3.87–5.11)
RDW: 16.3 % — ABNORMAL HIGH (ref 11.5–15.5)
WBC: 15.9 10*3/uL — AB (ref 4.0–10.5)

## 2015-01-01 LAB — GLUCOSE, CAPILLARY
GLUCOSE-CAPILLARY: 198 mg/dL — AB (ref 65–99)
GLUCOSE-CAPILLARY: 147 mg/dL — AB (ref 65–99)
Glucose-Capillary: 231 mg/dL — ABNORMAL HIGH (ref 65–99)
Glucose-Capillary: 256 mg/dL — ABNORMAL HIGH (ref 65–99)
Glucose-Capillary: 184 mg/dL — ABNORMAL HIGH (ref 65–99)
Glucose-Capillary: 187 mg/dL — ABNORMAL HIGH (ref 65–99)

## 2015-01-01 LAB — POCT I-STAT 3, ART BLOOD GAS (G3+)
Acid-Base Excess: 2 mmol/L (ref 0.0–2.0)
Acid-Base Excess: 2 mmol/L (ref 0.0–2.0)
Bicarbonate: 26.4 mEq/L — ABNORMAL HIGH (ref 20.0–24.0)
Bicarbonate: 27.4 mEq/L — ABNORMAL HIGH (ref 20.0–24.0)
O2 Saturation: 78 %
O2 Saturation: 94 %
PH ART: 7.407 (ref 7.350–7.450)
PO2 ART: 75 mmHg — AB (ref 80.0–100.0)
Patient temperature: 99.6
TCO2: 28 mmol/L (ref 0–100)
TCO2: 29 mmol/L (ref 0–100)
pCO2 arterial: 42.2 mmHg (ref 35.0–45.0)
pCO2 arterial: 49.7 mmHg — ABNORMAL HIGH (ref 35.0–45.0)
pH, Arterial: 7.351 (ref 7.350–7.450)
pO2, Arterial: 43 mmHg — ABNORMAL LOW (ref 80.0–100.0)

## 2015-01-01 LAB — BASIC METABOLIC PANEL
Anion gap: 7 (ref 5–15)
BUN: 27 mg/dL — AB (ref 6–20)
CO2: 30 mmol/L (ref 22–32)
CREATININE: 1.44 mg/dL — AB (ref 0.44–1.00)
Calcium: 7.9 mg/dL — ABNORMAL LOW (ref 8.9–10.3)
Chloride: 113 mmol/L — ABNORMAL HIGH (ref 101–111)
GFR calc non Af Amer: 51 mL/min — ABNORMAL LOW (ref >60)
GFR, EST AFRICAN AMERICAN: 60 mL/min — AB (ref >60)
Glucose, Bld: 204 mg/dL — ABNORMAL HIGH (ref 65–99)
POTASSIUM: 3.4 mmol/L — AB (ref 3.5–5.1)
SODIUM: 150 mmol/L — AB (ref 135–145)

## 2015-01-01 LAB — CULTURE, BLOOD (ROUTINE X 2)
Culture: NO GROWTH
Culture: NO GROWTH

## 2015-01-01 LAB — PROCALCITONIN: Procalcitonin: 0.46 ng/mL

## 2015-01-01 MED ORDER — PROPOFOL 1000 MG/100ML IV EMUL
5.0000 ug/kg/min | INTRAVENOUS | Status: DC
  Administered 2015-01-01: 60 ug/kg/min via INTRAVENOUS
  Administered 2015-01-01: 10 ug/kg/min via INTRAVENOUS
  Administered 2015-01-01 (×4): 60 ug/kg/min via INTRAVENOUS
  Administered 2015-01-02: 55 ug/kg/min via INTRAVENOUS
  Administered 2015-01-02 (×2): 60 ug/kg/min via INTRAVENOUS
  Administered 2015-01-02: 50 ug/kg/min via INTRAVENOUS
  Administered 2015-01-02 (×4): 60 ug/kg/min via INTRAVENOUS
  Administered 2015-01-02: 50 ug/kg/min via INTRAVENOUS
  Administered 2015-01-02: 60 ug/kg/min via INTRAVENOUS
  Administered 2015-01-03: 40 ug/kg/min via INTRAVENOUS
  Administered 2015-01-03: 60 ug/kg/min via INTRAVENOUS
  Administered 2015-01-03: 80 ug/kg/min via INTRAVENOUS
  Administered 2015-01-03: 60 ug/kg/min via INTRAVENOUS
  Administered 2015-01-03: 40 ug/kg/min via INTRAVENOUS
  Administered 2015-01-03: 60 ug/kg/min via INTRAVENOUS
  Administered 2015-01-03: 70 ug/kg/min via INTRAVENOUS
  Administered 2015-01-03 – 2015-01-04 (×3): 40 ug/kg/min via INTRAVENOUS
  Administered 2015-01-04: 34 ug/kg/min via INTRAVENOUS
  Administered 2015-01-04: 40 ug/kg/min via INTRAVENOUS
  Administered 2015-01-04 (×2): 30 ug/kg/min via INTRAVENOUS
  Administered 2015-01-04 (×2): 40 ug/kg/min via INTRAVENOUS
  Administered 2015-01-05: 30 ug/kg/min via INTRAVENOUS
  Administered 2015-01-05: 33 ug/kg/min via INTRAVENOUS
  Administered 2015-01-05: 40 ug/kg/min via INTRAVENOUS
  Administered 2015-01-05 (×2): 38 ug/kg/min via INTRAVENOUS
  Administered 2015-01-05: 33 ug/kg/min via INTRAVENOUS
  Administered 2015-01-06: 25 ug/kg/min via INTRAVENOUS
  Administered 2015-01-06: 30 ug/kg/min via INTRAVENOUS
  Filled 2015-01-01: qty 100
  Filled 2015-01-01: qty 200
  Filled 2015-01-01 (×34): qty 100
  Filled 2015-01-01: qty 200
  Filled 2015-01-01 (×2): qty 100

## 2015-01-01 MED ORDER — HALOPERIDOL LACTATE 5 MG/ML IJ SOLN
1.0000 mg | INTRAMUSCULAR | Status: DC | PRN
Start: 2015-01-01 — End: 2015-01-04

## 2015-01-01 MED ORDER — POTASSIUM CHLORIDE 20 MEQ/15ML (10%) PO SOLN
40.0000 meq | Freq: Once | ORAL | Status: AC
  Administered 2015-01-01: 40 meq
  Filled 2015-01-01: qty 30

## 2015-01-01 MED ORDER — FENTANYL BOLUS VIA INFUSION
0.0000 ug | INTRAVENOUS | Status: DC | PRN
  Administered 2015-01-03: 50 ug via INTRAVENOUS
  Administered 2015-01-04 – 2015-01-05 (×3): 100 ug via INTRAVENOUS
  Filled 2015-01-01: qty 200

## 2015-01-01 MED ORDER — SODIUM CHLORIDE 0.9 % IV SOLN
0.0000 ug/h | INTRAVENOUS | Status: DC
  Administered 2015-01-01: 200 ug/h via INTRAVENOUS
  Administered 2015-01-01: 25 ug/h via INTRAVENOUS
  Administered 2015-01-02 (×2): 200 ug/h via INTRAVENOUS
  Administered 2015-01-03: 125 ug/h via INTRAVENOUS
  Administered 2015-01-03: 100 ug/h via INTRAVENOUS
  Administered 2015-01-03: 200 ug/h via INTRAVENOUS
  Administered 2015-01-04 – 2015-01-05 (×2): 125 ug/h via INTRAVENOUS
  Filled 2015-01-01 (×8): qty 50

## 2015-01-01 MED ORDER — CEFTRIAXONE SODIUM IN DEXTROSE 20 MG/ML IV SOLN
1.0000 g | INTRAVENOUS | Status: AC
  Administered 2015-01-01 – 2015-01-04 (×4): 1 g via INTRAVENOUS
  Filled 2015-01-01 (×4): qty 50

## 2015-01-01 MED ORDER — POTASSIUM CHLORIDE 20 MEQ/15ML (10%) PO SOLN: 20.0000 meq | ORAL | Status: DC

## 2015-01-01 MED ORDER — PROPOFOL 1000 MG/100ML IV EMUL
INTRAVENOUS | Status: AC
  Administered 2015-01-01: 10 ug/kg/min via INTRAVENOUS
  Filled 2015-01-01: qty 100

## 2015-01-01 NOTE — Progress Notes (Signed)
UR Completed. Jacquelinne Speak, RN, BSN.  [phone] 

## 2015-01-01 NOTE — Progress Notes (Signed)
eLink Physician-Brief Progress Note Patient Name: Felicia ChristenJanai Stotts DOB: May 16, 1996 MRN: [id number]   Date of Service  01/01/2015  HPI/Events of Note    eICU Interventions  Hypokalemia -repleted      Intervention Category Intermediate Interventions: Electrolyte abnormality - evaluation and management  Rainie Crenshaw V. 01/01/2015, 5:09 AM

## 2015-01-01 NOTE — Progress Notes (Signed)
Peep increased to 12, fio2 decreased to 90% per Dr Tyson AliasFeinstein order.

## 2015-01-01 NOTE — Progress Notes (Signed)
RN called in to room by respiratory therapist due to patient had become agitated.  Patient's mother present.  RN and patient's mother unable to calm patient down utilizing a soothing tone and reorienting patient.  PRN Fentanyl and Versed given.  Patient's arterial line became dislodged and was removed by respiratory therapist.  MD Vassie LollAlva camera'd in to room after this occurrence and was informed of situation, new orders received. Ivery QualeJackson, Garielle Mroz A, RN

## 2015-01-01 NOTE — Consult Note (Signed)
PULMONARY / CRITICAL CARE MEDICINE   Name: Donna ChristenJanai Rochon MRN: [id number] DOB: 06-02-1996    ADMISSION DATE:  12/26/2014 CONSULTATION DATE:  12/28/2014  REFERRING MD :  Dr. Mahala MenghiniSamtani  CHIEF COMPLAINT:  SOB  INITIAL PRESENTATION: 19 year old female admitted with flank and abdominal pain x 1 week. She was admitted for sepsis secondary to suspected pyelonephritis after CT scan. She was placed on broad spectrum antibiotics. 6/28 early AM she became hypoxemic requiring BiPAP. CXR revealed new R > L infiltrates. PCCM consulted.   STUDIES:  6/26 CT abd/pelvis: Bilateral pyelonephritis. Both kidneys are diffusely edematous with multi focal areas of abnormal low-density in both kidneys. New hepatomegaly. New edema of the gallbladder wall. No visible stones. The gallbladder is not distended. 6/28 US renal>>>Heterogeneity of the kidneys consistent with the finding seen on recent CT, Gallbladder wall edema without definitive cholelithiasis. 6/28 dopplers>>>neg lowers 6/28 echo>>>65%, PA 52   SIGNIFICANT EVENTS: 6/28 ett placed, ALI, shock, MODS 6/30 self extubated requiring immediate re-intubation 7/01 Tolerated PS 5-10 cm H2O. Too somnolent to extubate. Dexmedetomidine initiated  SUBJECTIVE:  RASS 1 aggitation, remained on 100%  VITAL SIGNS: Temp:  [98.4 F (36.9 C)-102.3 F (39.1 C)] 99.6 F (37.6 C) (07/02 0747) Pulse Rate:  [88-112] 93 (07/02 0905) Resp:  [0-39] 24 (07/02 0905) BP: (130-165)/(60-105) 165/105 mmHg (07/02 0905) SpO2:  [93 %-100 %] 98 % (07/02 0905) Arterial Line BP: (110-165)/(46-80) 165/72 mmHg (07/01 2300) FiO2 (%):  [40 %-80 %] 70 % (07/02 0905) Weight:  [119.4 kg (263 lb 3.7 oz)] 119.4 kg (263 lb 3.7 oz) (07/02 0400) HEMODYNAMICS: CVP:  [16 mmHg] 16 mmHg VENTILATOR SETTINGS: Vent Mode:  [-] PCV FiO2 (%):  [40 %-80 %] 70 % Set Rate:  [14 bmp-20 bmp] 14 bmp PEEP:  [5 cmH20-12 cmH20] 5 cmH20 Pressure Support:  [12 cmH20-15 cmH20] 15 cmH20 Plateau Pressure:  [20  cmH20-25 cmH20] 23 cmH20 INTAKE / OUTPUT:  Intake/Output Summary (Last 24 hours) at 01/01/15 0944 Last data filed at 01/01/15 0648  Gross per 24 hour  Intake 4038.74 ml  Output   2715 ml  Net 1323.74 ml    PHYSICAL EXAMINATION: General: NAD. Obese Neuro:  RASS 1. jvd tough to tell, obese HEENTett Cardiovascular:  s1 s2 Reg, no M Lungs: ronchi bilateral Abdomen: Soft, +BS Ext: no edema, warm  LABS:  CBC  Recent Labs Lab 12/30/14 0430 12/31/14 0400 01/01/15 0340  WBC 17.8* 15.8* 15.9*  HGB 8.2* 8.2* 7.6*  HCT 25.0* 25.7* 24.3*  PLT 288 313 263   Coag's  Recent Labs Lab 12/28/14 0142 12/28/14 1129  APTT  --  37  INR 1.31 1.30   BMET  Recent Labs Lab 12/30/14 0430 12/31/14 0400 01/01/15 0340  NA 146* 152* 150*  K 3.7 3.5 3.4*  CL 111 113* 113*  CO2 28 31 30   BUN 26* 30* 27*  CREATININE 1.82* 1.51* 1.44*  GLUCOSE 249* 217* 204*   Electrolytes  Recent Labs Lab 12/30/14 0430 12/31/14 0400 01/01/15 0340  CALCIUM 7.4* 8.2* 7.9*   Sepsis Markers  Recent Labs Lab 12/26/14 2154 12/28/14 0142 12/28/14 1127 12/29/14 0230 01/01/15 0340  LATICACIDVEN 2.07* 1.0 0.7  --   --   PROCALCITON  --  2.18  --  1.43 0.46   ABG  Recent Labs Lab 12/29/14 1101 12/30/14 0521 12/30/14 1741  PHART 7.282* 7.300* 7.327*  PCO2ART 52.9* 56.9* 54.7*  PO2ART 102.0* 66.8* 232*   Liver Enzymes  Recent Labs Lab 12/29/14 0230 12/30/14 0430  12/31/14 0400  AST 13* 15 15  ALT 13* 14 15  ALKPHOS 105 89 95  BILITOT 1.1 0.5 0.4  ALBUMIN 1.5* 1.6* 1.6*   Cardiac Enzymes  Recent Labs Lab 12/28/14 1129  TROPONINI 0.09*   Glucose  Recent Labs Lab 12/31/14 1138 12/31/14 1627 12/31/14 2050 12/31/14 2334 01/01/15 0347 01/01/15 0745  GLUCAP 199* 217* 200* 200* 198* 231*    CXR: Increased edema, ett wnl   ASSESSMENT / PLAN:  PULMONARY A: Acute hypoxic respiratory failure ARDS, radiographically resolving Failed self extubation 6/30 pulm edema  increased P:   Noted on 100%, pee on 5, attempt to goal 60%, if unabel then peep to 8 , concern is increased edema cauding pcxr in am  abg assessment Neg balance goals with free water ( see renal), was pos 1.5 liters last 24 hrs - if unable to get to 60%  CARDIOVASCULAR A:  No issues P:  Monitor tele  RENAL A:   AKI, nonoliguric - improving Hypokalemia, resolved Metabolic acidosis, resolved Hypernatremia P:   Monitor BMET am k supp Increase free water 7/01, keep but may nee dlasix if O2 needs worsen with increased free water further Keep KVO  GASTROINTESTINAL A:   Abdominal pain, resolved P:   SUP: enteral famotidine Cont TFs  HEMATOLOGIC A:   Anemia without overt bleeding P:  DVT px: SQ heparin Monitor CBC intermittently  INFECTIOUS A:   Severe sepsis  Pyelonephritis, no organism identified P:   Urine 6/29 >> NEG Resp 6/28 >> NEG Blood 6/27 >> NEG  Vanc 6/26 >> 6/30 Pip/tazo 6/26 >> 6/30 Ceftaz 6/30 >> 7/2 ceftriaxone 7/2>>>  Remains culture neg, narrow  ENDOCRINE A:   Hyperglycemia P:   Cont SSI  NEUROLOGIC A:   Agitation Acute encephalopathy P:   RASS goal: - 1 dexmedtomidine initiated 7/01 - failed, add propofol Low dose scheduled clonazepam initiated 7/01 Daily WUA Prn haldol   FAMILY: Mother updated @ bedside  Ccm time 30 min   Mcarthur Rossetti. Tyson Alias, MD, FACP Pgr: [phone] [facility]

## 2015-01-02 ENCOUNTER — Inpatient Hospital Stay (HOSPITAL_COMMUNITY): Payer: Medicaid Other

## 2015-01-02 LAB — CBC WITH DIFFERENTIAL/PLATELET
BASOS PCT: 0 % (ref 0–1)
Basophils Absolute: 0 10*3/uL (ref 0.0–0.1)
Eosinophils Absolute: 0.3 10*3/uL (ref 0.0–0.7)
Eosinophils Relative: 2 % (ref 0–5)
HCT: 22.6 % — ABNORMAL LOW (ref 36.0–46.0)
HEMOGLOBIN: 7 g/dL — AB (ref 12.0–15.0)
LYMPHS PCT: 16 % (ref 12–46)
Lymphs Abs: 2.6 10*3/uL (ref 0.7–4.0)
MCH: 25.7 pg — ABNORMAL LOW (ref 26.0–34.0)
MCHC: 31 g/dL (ref 30.0–36.0)
MCV: 83.1 fL (ref 78.0–100.0)
Monocytes Absolute: 0.7 10*3/uL (ref 0.1–1.0)
Monocytes Relative: 4 % (ref 3–12)
NEUTROS PCT: 78 % — AB (ref 43–77)
Neutro Abs: 12.9 10*3/uL — ABNORMAL HIGH (ref 1.7–7.7)
PLATELETS: 199 10*3/uL (ref 150–400)
RBC: 2.72 MIL/uL — AB (ref 3.87–5.11)
RDW: 16.3 % — AB (ref 11.5–15.5)
WBC: 16.5 10*3/uL — ABNORMAL HIGH (ref 4.0–10.5)

## 2015-01-02 LAB — BASIC METABOLIC PANEL
ANION GAP: 6 (ref 5–15)
BUN: 20 mg/dL (ref 6–20)
CO2: 30 mmol/L (ref 22–32)
Calcium: 7.9 mg/dL — ABNORMAL LOW (ref 8.9–10.3)
Chloride: 111 mmol/L (ref 101–111)
Creatinine, Ser: 1.19 mg/dL — ABNORMAL HIGH (ref 0.44–1.00)
GFR calc Af Amer: >60 mL/min (ref >60)
Glucose, Bld: 193 mg/dL — ABNORMAL HIGH (ref 65–99)
Potassium: 3.3 mmol/L — ABNORMAL LOW (ref 3.5–5.1)
Sodium: 147 mmol/L — ABNORMAL HIGH (ref 135–145)

## 2015-01-02 LAB — GLUCOSE, CAPILLARY
GLUCOSE-CAPILLARY: 173 mg/dL — AB (ref 65–99)
GLUCOSE-CAPILLARY: 203 mg/dL — AB (ref 65–99)
Glucose-Capillary: 177 mg/dL — ABNORMAL HIGH (ref 65–99)
Glucose-Capillary: 187 mg/dL — ABNORMAL HIGH (ref 65–99)
Glucose-Capillary: 174 mg/dL — ABNORMAL HIGH (ref 65–99)
Glucose-Capillary: 159 mg/dL — ABNORMAL HIGH (ref 65–99)

## 2015-01-02 LAB — POCT I-STAT 3, ART BLOOD GAS (G3+)
ACID-BASE EXCESS: 2 mmol/L (ref 0.0–2.0)
BICARBONATE: 28 meq/L — AB (ref 20.0–24.0)
O2 Saturation: 93 %
Patient temperature: 98.1
TCO2: 29 mmol/L (ref 0–100)
pCO2 arterial: 47.2 mmHg — ABNORMAL HIGH (ref 35.0–45.0)
pH, Arterial: 7.38 (ref 7.350–7.450)
pO2, Arterial: 67 mmHg — ABNORMAL LOW (ref 80.0–100.0)

## 2015-01-02 LAB — CBC
HCT: 26.5 % — ABNORMAL LOW (ref 36.0–46.0)
Hemoglobin: 8.3 g/dL — ABNORMAL LOW (ref 12.0–15.0)
MCH: 26 pg (ref 26.0–34.0)
MCHC: 31.3 g/dL (ref 30.0–36.0)
MCV: 83.1 fL (ref 78.0–100.0)
PLATELETS: 222 10*3/uL (ref 150–400)
RBC: 3.19 MIL/uL — ABNORMAL LOW (ref 3.87–5.11)
RDW: 15.9 % — ABNORMAL HIGH (ref 11.5–15.5)
WBC: 17.6 10*3/uL — ABNORMAL HIGH (ref 4.0–10.5)

## 2015-01-02 MED ORDER — FUROSEMIDE 10 MG/ML IJ SOLN
40.0000 mg | Freq: Three times a day (TID) | INTRAMUSCULAR | Status: AC
  Administered 2015-01-02 – 2015-01-03 (×4): 40 mg via INTRAVENOUS
  Filled 2015-01-02 (×4): qty 4

## 2015-01-02 MED ORDER — POTASSIUM CHLORIDE 20 MEQ/15ML (10%) PO SOLN
20.0000 meq | ORAL | Status: AC
Start: 2015-01-02 — End: 2015-01-02
  Administered 2015-01-02 (×2): 20 meq
  Filled 2015-01-02 (×2): qty 15

## 2015-01-02 NOTE — Consult Note (Signed)
PULMONARY / CRITICAL CARE MEDICINE   Name: Felicia Holt MRN: [id number] DOB: 12/06/1995    ADMISSION DATE:  12/26/2014 CONSULTATION DATE:  12/28/2014  REFERRING MD :  Dr. Mahala MenghiniSamtani  CHIEF COMPLAINT:  SOB  INITIAL PRESENTATION: 19 year old female admitted with flank and abdominal pain x 1 week. She was admitted for sepsis secondary to suspected pyelonephritis after CT scan. She was placed on broad spectrum antibiotics. 6/28 early AM she became hypoxemic requiring BiPAP. CXR revealed new R > L infiltrates. PCCM consulted.   STUDIES:  6/26 CT abd/pelvis: Bilateral pyelonephritis. Both kidneys are diffusely edematous with multi focal areas of abnormal low-density in both kidneys. New hepatomegaly. New edema of the gallbladder wall. No visible stones. The gallbladder is not distended. 6/28 US renal>>>Heterogeneity of the kidneys consistent with the finding seen on recent CT, Gallbladder wall edema without definitive cholelithiasis. 6/28 dopplers>>>neg lowers 6/28 echo>>>65%, PA 52   SIGNIFICANT EVENTS: 6/28 ett placed, ALI, shock, MODS 6/30 self extubated requiring immediate re-intubation 7/01 Tolerated PS 5-10 cm H2O. Too somnolent to extubate. Dexmedetomidine initiated  SUBJECTIVE:  Peep 12 required sats are good, pos balance 3 liters  VITAL SIGNS: Temp:  [98.1 F (36.7 C)-99.5 F (37.5 C)] 98.1 F (36.7 C) (07/03 0800) Pulse Rate:  [43-93] 83 (07/03 0826) Resp:  [25-42] 30 (07/03 0826) BP: (95-152)/(44-77) 131/74 mmHg (07/03 0826) SpO2:  [94 %-100 %] 99 % (07/03 0826) FiO2 (%):  [80 %-100 %] 80 % (07/03 0826) Weight:  [119 kg (262 lb 5.6 oz)] 119 kg (262 lb 5.6 oz) (07/03 0350) HEMODYNAMICS:   VENTILATOR SETTINGS: Vent Mode:  [-] PCV FiO2 (%):  [80 %-100 %] 80 % Set Rate:  [14 bmp] 14 bmp PEEP:  [10 cmH20-12 cmH20] 12 cmH20 INTAKE / OUTPUT:  Intake/Output Summary (Last 24 hours) at 01/02/15 0909 Last data filed at 01/02/15 0800  Gross per 24 hour  Intake 5124.29 ml   Output   2550 ml  Net 2574.29 ml    PHYSICAL EXAMINATION: General: NAD. Obese Neuro:  RASS -2. jvd increase likely HEENT ett high? Cardiovascular:  s1 s2 Reg, no M Lungs: ronchi bilateral unchanged Abdomen: Soft, +BS Ext: no edema, warm  LABS:  CBC  Recent Labs Lab 12/31/14 0400 01/01/15 0340 01/02/15 0350  WBC 15.8* 15.9* 16.5*  HGB 8.2* 7.6* 7.0*  HCT 25.7* 24.3* 22.6*  PLT 313 263 199   Coag's  Recent Labs Lab 12/28/14 0142 12/28/14 1129  APTT  --  37  INR 1.31 1.30   BMET  Recent Labs Lab 12/31/14 0400 01/01/15 0340 01/02/15 0350  NA 152* 150* 147*  K 3.5 3.4* 3.3*  CL 113* 113* 111  CO2 31 30 30   BUN 30* 27* 20  CREATININE 1.51* 1.44* 1.19*  GLUCOSE 217* 204* 193*   Electrolytes  Recent Labs Lab 12/31/14 0400 01/01/15 0340 01/02/15 0350  CALCIUM 8.2* 7.9* 7.9*   Sepsis Markers  Recent Labs Lab 12/26/14 2154 12/28/14 0142 12/28/14 1127 12/29/14 0230 01/01/15 0340  LATICACIDVEN 2.07* 1.0 0.7  --   --   PROCALCITON  --  2.18  --  1.43 0.46   ABG  Recent Labs Lab 12/30/14 1741 01/01/15 1047 01/01/15 1229  PHART 7.327* 7.407 7.351  PCO2ART 54.7* 42.2 49.7*  PO2ART 232* 43.0* 75.0*   Liver Enzymes  Recent Labs Lab 12/29/14 0230 12/30/14 0430 12/31/14 0400  AST 13* 15 15  ALT 13* 14 15  ALKPHOS 105 89 95  BILITOT 1.1 0.5 0.4  ALBUMIN  1.5* 1.6* 1.6*   Cardiac Enzymes  Recent Labs Lab 12/28/14 1129  TROPONINI 0.09*   Glucose  Recent Labs Lab 01/01/15 1137 01/01/15 1729 01/01/15 1913 01/01/15 2309 01/02/15 0330 01/02/15 0732  GLUCAP 256* 184* 147* 187* 177* 187*    CXR: maybe some improved aeration bilat, ett wnl   ASSESSMENT / PLAN:  PULMONARY A: Acute hypoxic respiratory failure ARDS, radiographically resolving Failed self extubation 6/30 pulm edema increased P:   Avoid such pos balance, lasix required PCV 12 keep, peep 12 , reduce if to 60% TV are in 400 Plat are capped as on PC  ABg  assessment needed  CARDIOVASCULAR A:  Overload P:  Monitor Tele Get cvp  RENAL A:   AKI, nonoliguric - improving Hypokalemia, resolved Metabolic acidosis, resolved Hypernatremia P:   Monitor BMET am k supp Ifree water to remain kvo all other Lasix needed, see pulm section  GASTROINTESTINAL A:   Abdominal pain, resolved P:   SUP: enteral famotidine Cont TFs  HEMATOLOGIC A:   Anemia without overt bleeding, dilution P:  DVT px: SQ heparin Monitor CBC intermittently with lasix to neg balance, hct should rise  INFECTIOUS A:   Severe sepsis  Pyelonephritis, no organism identified P:   Urine 6/29 >> NEG Resp 6/28 >> NEG Blood 6/27 >> NEG  Vanc 6/26 >> 6/30 Pip/tazo 6/26 >> 6/30 Ceftaz 6/30 >> 7/2 ceftriaxone 7/2>>>  Add stop date abx, consider 10 days given pyelo and bilateral  ENDOCRINE A:   Hyperglycemia P:   Cont SSI  NEUROLOGIC A:   Agitation Acute encephalopathy P:   RASS goal: - 1 propofol Low dose scheduled clonazepam initiated 7/01 Daily WUA required  FAMILY: Mother updated @ bedside daily  Ccm time 30 min   Mcarthur Rossetti. Tyson Alias, MD, FACP Pgr: [phone] [facility]

## 2015-01-02 NOTE — Progress Notes (Signed)
Weaned peep to 10, and fio2 to 50% per Dr Tyson AliasFeinstein order.

## 2015-01-02 NOTE — Progress Notes (Signed)
OET at 19 cm at lip, advanced to 23 cm at lip per MD order, per cxray placement this morning. RN at bedside to assist.

## 2015-01-02 NOTE — Progress Notes (Signed)
Pt Hgb 7, called ramaswamy. Orders to transfuse less than 7. Will continue to monitor.

## 2015-01-02 NOTE — Progress Notes (Signed)
[facility]AC[facility] ONLY  The [facility] does [facility] based on the criteria listed below:   1. Is GFR >/= 40 ml/min? Yes.    Patient's GFR today is >60 2. Is urine output >/= 0.5 ml/kg/hr for the last 6 hours? Yes.   Patient's UOP is 1.4 ml/kg/hr 3. Is BUN < 60 mg/dL? Yes.    Patient's BUN today is 20 4. Abnormal electrolyte(s): K3.3 5. Ordered repletion with: per protocol 6. If a panic level lab has been reported, has the CCM MD in charge been notified? Yes.  .   Physician:  Maryjean MornM Ramaswamey  Dana Dorner William 01/02/2015 5:34 AM

## 2015-01-03 ENCOUNTER — Inpatient Hospital Stay (HOSPITAL_COMMUNITY): Payer: Medicaid Other

## 2015-01-03 LAB — GLUCOSE, CAPILLARY
GLUCOSE-CAPILLARY: 202 mg/dL — AB (ref 65–99)
Glucose-Capillary: 263 mg/dL — ABNORMAL HIGH (ref 65–99)
Glucose-Capillary: 242 mg/dL — ABNORMAL HIGH (ref 65–99)
Glucose-Capillary: 192 mg/dL — ABNORMAL HIGH (ref 65–99)
Glucose-Capillary: 213 mg/dL — ABNORMAL HIGH (ref 65–99)

## 2015-01-03 LAB — CBC WITH DIFFERENTIAL/PLATELET
Basophils Absolute: 0 10*3/uL (ref 0.0–0.1)
Basophils Relative: 0 % (ref 0–1)
Eosinophils Absolute: 0.4 10*3/uL (ref 0.0–0.7)
Eosinophils Relative: 3 % (ref 0–5)
HEMATOCRIT: 24.5 % — AB (ref 36.0–46.0)
HEMOGLOBIN: 7.7 g/dL — AB (ref 12.0–15.0)
Lymphocytes Relative: 19 % (ref 12–46)
Lymphs Abs: 2.8 10*3/uL (ref 0.7–4.0)
MCH: 25.5 pg — ABNORMAL LOW (ref 26.0–34.0)
MCHC: 31.4 g/dL (ref 30.0–36.0)
MCV: 81.1 fL (ref 78.0–100.0)
Monocytes Absolute: 0.6 10*3/uL (ref 0.1–1.0)
Monocytes Relative: 4 % (ref 3–12)
NEUTROS PCT: 74 % (ref 43–77)
Neutro Abs: 10.9 10*3/uL — ABNORMAL HIGH (ref 1.7–7.7)
Platelets: 196 10*3/uL (ref 150–400)
RBC: 3.02 MIL/uL — ABNORMAL LOW (ref 3.87–5.11)
RDW: 16 % — ABNORMAL HIGH (ref 11.5–15.5)
WBC: 14.7 10*3/uL — AB (ref 4.0–10.5)

## 2015-01-03 LAB — MAGNESIUM: Magnesium: 1.8 mg/dL (ref 1.7–2.4)

## 2015-01-03 LAB — BASIC METABOLIC PANEL
Anion gap: 9 (ref 5–15)
BUN: 19 mg/dL (ref 6–20)
CALCIUM: 7.9 mg/dL — AB (ref 8.9–10.3)
CHLORIDE: 102 mmol/L (ref 101–111)
CO2: 33 mmol/L — AB (ref 22–32)
Creatinine, Ser: 1.17 mg/dL — ABNORMAL HIGH (ref 0.44–1.00)
GFR calc non Af Amer: >60 mL/min (ref >60)
Glucose, Bld: 229 mg/dL — ABNORMAL HIGH (ref 65–99)
POTASSIUM: 3.5 mmol/L (ref 3.5–5.1)
SODIUM: 144 mmol/L (ref 135–145)

## 2015-01-03 LAB — PHOSPHORUS: Phosphorus: 4.4 mg/dL (ref 2.5–4.6)

## 2015-01-03 MED ORDER — CLONAZEPAM 0.5 MG PO TABS
0.2500 mg | ORAL_TABLET | Freq: Two times a day (BID) | ORAL | Status: DC
  Administered 2015-01-03 – 2015-01-07 (×9): 0.25 mg
  Filled 2015-01-03 (×9): qty 1

## 2015-01-03 MED ORDER — FUROSEMIDE 10 MG/ML IJ SOLN
40.0000 mg | Freq: Two times a day (BID) | INTRAMUSCULAR | Status: AC
  Administered 2015-01-03 – 2015-01-05 (×4): 40 mg via INTRAVENOUS
  Filled 2015-01-03 (×4): qty 4

## 2015-01-03 MED ORDER — POTASSIUM CHLORIDE 20 MEQ/15ML (10%) PO SOLN
20.0000 meq | ORAL | Status: AC
  Administered 2015-01-03 (×2): 20 meq
  Filled 2015-01-03 (×2): qty 15

## 2015-01-03 MED ORDER — CLONAZEPAM 0.5 MG PO TABS: 0.5000 mg | ORAL_TABLET | Freq: Two times a day (BID) | ORAL | Status: DC

## 2015-01-03 MED ORDER — MAGNESIUM SULFATE 2 GM/50ML IV SOLN
2.0000 g | Freq: Once | INTRAVENOUS | Status: AC
  Administered 2015-01-03: 2 g via INTRAVENOUS
  Filled 2015-01-03: qty 50

## 2015-01-03 NOTE — Progress Notes (Signed)
Bed in chair position. Patient tolerating well at this time. Passive range of motion performed with patient.  Rise PaganiniURRY, Cartina Brousseau R, RN

## 2015-01-03 NOTE — Consult Note (Signed)
PULMONARY / CRITICAL CARE MEDICINE   Name: Felicia Holt MRN: [id number] DOB: Oct 05, 1995    ADMISSION DATE:  12/26/2014 CONSULTATION DATE:  12/28/2014  REFERRING MD :  Dr. Mahala Menghini  CHIEF COMPLAINT:  SOB  INITIAL PRESENTATION: 19 year old female admitted with flank and abdominal pain x 1 week. She was admitted for sepsis secondary to suspected pyelonephritis after CT scan. She was placed on broad spectrum antibiotics. 6/28 early AM she became hypoxemic requiring BiPAP. CXR revealed new R > L infiltrates. PCCM consulted.   STUDIES:  6/26 CT abd/pelvis: Bilateral pyelonephritis. Both kidneys are diffusely edematous with multi focal areas of abnormal low-density in both kidneys. New hepatomegaly. New edema of the gallbladder wall. No visible stones. The gallbladder is not distended. 6/28 US renal>>>Heterogeneity of the kidneys consistent with the finding seen on recent CT, Gallbladder wall edema without definitive cholelithiasis. 6/28 dopplers>>>neg lowers 6/28 echo>>>65%, PA 52   SIGNIFICANT EVENTS: 6/28 ett placed, ALI, shock, MODS 6/30 self extubated requiring immediate re-intubation 7/01 Tolerated PS 5-10 cm H2O. Too somnolent to extubate. Dexmedetomidine initiated  SUBJECTIVE:  Neg 5 liters, to peep 8   VITAL SIGNS: Temp:  [98 F (36.7 C)-99 F (37.2 C)] 99 F (37.2 C) (07/04 0400) Pulse Rate:  [78-100] 92 (07/04 0700) Resp:  [25-36] 28 (07/04 0700) BP: (89-139)/(34-80) 107/52 mmHg (07/04 0700) SpO2:  [93 %-100 %] 96 % (07/04 0700) FiO2 (%):  [50 %-90 %] 50 % (07/04 0400) Weight:  [116.5 kg (256 lb 13.4 oz)] 116.5 kg (256 lb 13.4 oz) (07/04 0333) HEMODYNAMICS: CVP:  [7 mmHg-22 mmHg] 7 mmHg VENTILATOR SETTINGS: Vent Mode:  [-] PCV FiO2 (%):  [50 %-90 %] 50 % Set Rate:  [14 bmp] 14 bmp PEEP:  [5 cmH20-12 cmH20] 8 cmH20 INTAKE / OUTPUT:  Intake/Output Summary (Last 24 hours) at 01/03/15 0819 Last data filed at 01/03/15 0700  Gross per 24 hour  Intake 5515.76 ml   Output  56213 ml  Net -5009.24 ml    PHYSICAL EXAMINATION: General: NAD. Obese Neuro:  RASS -2. jvd lower HEENT ett, perr Cardiovascular:  s1 s2 Reg, no M Lungs: ronchi resolving to coarse Abdomen: Soft, +BS Ext: no edema, warm  LABS:  CBC  Recent Labs Lab 01/02/15 0350 01/02/15 1700 01/03/15 0340  WBC 16.5* 17.6* 14.7*  HGB 7.0* 8.3* 7.7*  HCT 22.6* 26.5* 24.5*  PLT 199 222 196   Coag's  Recent Labs Lab 12/28/14 0142 12/28/14 1129  APTT  --  37  INR 1.31 1.30   BMET  Recent Labs Lab 01/01/15 0340 01/02/15 0350 01/03/15 0340  NA 150* 147* 144  K 3.4* 3.3* 3.5  CL 113* 111 102  CO2 30 30 33*  BUN 27* 20 19  CREATININE 1.44* 1.19* 1.17*  GLUCOSE 204* 193* 229*   Electrolytes  Recent Labs Lab 01/01/15 0340 01/02/15 0350 01/03/15 0340  CALCIUM 7.9* 7.9* 7.9*  MG  --   --  1.8  PHOS  --   --  4.4   Sepsis Markers  Recent Labs Lab 12/28/14 0142 12/28/14 1127 12/29/14 0230 01/01/15 0340  LATICACIDVEN 1.0 0.7  --   --   PROCALCITON 2.18  --  1.43 0.46   ABG  Recent Labs Lab 01/01/15 1047 01/01/15 1229 01/02/15 1101  PHART 7.407 7.351 7.380  PCO2ART 42.2 49.7* 47.2*  PO2ART 43.0* 75.0* 67.0*   Liver Enzymes  Recent Labs Lab 12/29/14 0230 12/30/14 0430 12/31/14 0400  AST 13* 15 15  ALT 13* 14  15  ALKPHOS 105 89 95  BILITOT 1.1 0.5 0.4  ALBUMIN 1.5* 1.6* 1.6*   Cardiac Enzymes  Recent Labs Lab 12/28/14 1129  TROPONINI 0.09*   Glucose  Recent Labs Lab 01/02/15 0732 01/02/15 1211 01/02/15 1702 01/02/15 1926 01/02/15 2334 01/03/15 0324  GLUCAP 187* 203* 174* 159* 173* 202*    CXR: improved int edema, ett wnl   ASSESSMENT / PLAN:  PULMONARY A: Acute hypoxic respiratory failure ARDS, radiographically resolving Failed self extubation 6/30 pulm edema increased P:   Improved pcxr, peep , fio2 with neg 5 liters Keep neg balance to 2 liters next 24 hrs Required peep 8 still, goal PS 10, cpap 8 wean Plat  are capped as on PC  factt- cvp goal 4  CARDIOVASCULAR A:  Overload P:  Monitor with lasix and hypok risk Tele  RENAL A:   AKI, nonoliguric - improving Hypernatremia resolving P:   Monitor BMET in am with such neg balance k supp, mag supp free water kvo all other Lasix maintain  GASTROINTESTINAL A:   Abdominal pain, resolved P:   SUP: enteral famotidine Cont TFs  HEMATOLOGIC A:   Anemia without overt bleeding, dilution P:  DVT px: SQ heparin lasix  INFECTIOUS A:   Severe sepsis  Pyelonephritis, no organism identified P:   Urine 6/29 >> NEG Resp 6/28 >> NEG Blood 6/27 >> NEG  Vanc 6/26 >> 6/30 Pip/tazo 6/26 >> 6/30 Ceftaz 6/30 >> 7/2 ceftriaxone 7/2>>>  Stop date in place, total 10 days required  ENDOCRINE A:   Hyperglycemia, better controlled P:   Cont SSI  NEUROLOGIC A:   Agitation Acute encephalopathy P:   RASS goal: - 1 Propofol with WUA, can wean on low dose Low dose scheduled clonazepam initiated 7/01 Fent  FAMILY: Mother updated @ bedside daily  Ccm time 30 min   Mcarthur RossettiDaniel J. Tyson AliasFeinstein, MD, FACP Pgr: [phone] [facility]

## 2015-01-03 NOTE — Progress Notes (Signed)
[facility] FOR AM LAB REPLACEMENT ONLY  The patient does [facility] based on the criteria listed below:   1. Is GFR >/= 40 ml/min? Yes.    Patient's GFR today is  >60 2. Is urine output >/= 0.5 ml/kg/hr for the last 6 hours? Yes.   Patient's UOP is 1.9 ml/kg/hr 3. Is BUN < 60 mg/dL? Yes.    Patient's BUN today is 19 4. Abnormal electrolyte(s): Potassium 3.5, Magnesium 1.8 5. Ordered repletion with: [facility] adult ICU replacement protocol 6. If a panic level lab has been reported, has the CCM MD in charge been notified? Yes.  .   Physician:  Dr. Loletha GrayerSteven Sommer  [facility] HospitalRAMZAH, Felicia Holt 01/03/2015 6:17 AM

## 2015-01-04 ENCOUNTER — Inpatient Hospital Stay (HOSPITAL_COMMUNITY): Payer: Medicaid Other

## 2015-01-04 DIAGNOSIS — G9340 Encephalopathy, unspecified: Secondary | ICD-10-CM | POA: Diagnosis present

## 2015-01-04 LAB — BRAIN NATRIURETIC PEPTIDE: B Natriuretic Peptide: 17.9 pg/mL (ref 0.0–100.0)

## 2015-01-04 LAB — CBC WITH DIFFERENTIAL/PLATELET
BASOS ABS: 0 10*3/uL (ref 0.0–0.1)
BASOS PCT: 0 % (ref 0–1)
EOS PCT: 2 % (ref 0–5)
Eosinophils Absolute: 0.2 10*3/uL (ref 0.0–0.7)
HEMATOCRIT: 24.9 % — AB (ref 36.0–46.0)
HEMOGLOBIN: 7.8 g/dL — AB (ref 12.0–15.0)
Lymphocytes Relative: 22 % (ref 12–46)
Lymphs Abs: 2.8 10*3/uL (ref 0.7–4.0)
MCH: 25.8 pg — ABNORMAL LOW (ref 26.0–34.0)
MCHC: 31.3 g/dL (ref 30.0–36.0)
MCV: 82.5 fL (ref 78.0–100.0)
MONO ABS: 0.6 10*3/uL (ref 0.1–1.0)
Monocytes Relative: 5 % (ref 3–12)
Neutro Abs: 8.8 10*3/uL — ABNORMAL HIGH (ref 1.7–7.7)
Neutrophils Relative %: 71 % (ref 43–77)
Platelets: 213 10*3/uL (ref 150–400)
RBC: 3.02 MIL/uL — ABNORMAL LOW (ref 3.87–5.11)
RDW: 15.7 % — ABNORMAL HIGH (ref 11.5–15.5)
WBC: 12.3 10*3/uL — ABNORMAL HIGH (ref 4.0–10.5)

## 2015-01-04 LAB — COMPREHENSIVE METABOLIC PANEL
ALT: 28 U/L (ref 14–54)
ANION GAP: 6 (ref 5–15)
AST: 30 U/L (ref 15–41)
Albumin: 1.6 g/dL — ABNORMAL LOW (ref 3.5–5.0)
Alkaline Phosphatase: 73 U/L (ref 38–126)
BUN: 21 mg/dL — ABNORMAL HIGH (ref 6–20)
CALCIUM: 8.1 mg/dL — AB (ref 8.9–10.3)
CO2: 36 mmol/L — ABNORMAL HIGH (ref 22–32)
Chloride: 101 mmol/L (ref 101–111)
Creatinine, Ser: 1.1 mg/dL — ABNORMAL HIGH (ref 0.44–1.00)
GFR calc Af Amer: >60 mL/min (ref >60)
GFR calc non Af Amer: >60 mL/min (ref >60)
GLUCOSE: 263 mg/dL — AB (ref 65–99)
Potassium: 3.6 mmol/L (ref 3.5–5.1)
SODIUM: 143 mmol/L (ref 135–145)
Total Bilirubin: 0.4 mg/dL (ref 0.3–1.2)
Total Protein: 6.1 g/dL — ABNORMAL LOW (ref 6.5–8.1)

## 2015-01-04 LAB — GLUCOSE, CAPILLARY
Glucose-Capillary: 234 mg/dL — ABNORMAL HIGH (ref 65–99)
Glucose-Capillary: 247 mg/dL — ABNORMAL HIGH (ref 65–99)
Glucose-Capillary: 225 mg/dL — ABNORMAL HIGH (ref 65–99)
Glucose-Capillary: 227 mg/dL — ABNORMAL HIGH (ref 65–99)
Glucose-Capillary: 184 mg/dL — ABNORMAL HIGH (ref 65–99)
Glucose-Capillary: 162 mg/dL — ABNORMAL HIGH (ref 65–99)
Glucose-Capillary: 173 mg/dL — ABNORMAL HIGH (ref 65–99)

## 2015-01-04 LAB — TRIGLYCERIDES: Triglycerides: 306 mg/dL — ABNORMAL HIGH (ref <150)

## 2015-01-04 LAB — PATHOLOGIST SMEAR REVIEW

## 2015-01-04 LAB — MAGNESIUM: Magnesium: 2.2 mg/dL (ref 1.7–2.4)

## 2015-01-04 MED ORDER — PRO-STAT SUGAR FREE PO LIQD
60.0000 mL | Freq: Three times a day (TID) | ORAL | Status: DC
  Administered 2015-01-04 – 2015-01-06 (×6): 60 mL
  Filled 2015-01-04 (×8): qty 60

## 2015-01-04 MED ORDER — POTASSIUM CHLORIDE 20 MEQ/15ML (10%) PO SOLN
40.0000 meq | Freq: Once | ORAL | Status: AC
  Administered 2015-01-04: 40 meq
  Filled 2015-01-04 (×2): qty 30

## 2015-01-04 MED ORDER — HALOPERIDOL LACTATE 5 MG/ML IJ SOLN
5.0000 mg | Freq: Four times a day (QID) | INTRAMUSCULAR | Status: DC
  Administered 2015-01-04 – 2015-01-06 (×8): 5 mg via INTRAVENOUS
  Filled 2015-01-04 (×17): qty 1

## 2015-01-04 MED ORDER — ADULT MULTIVITAMIN LIQUID CH
5.0000 mL | Freq: Every day | ORAL | Status: DC
  Administered 2015-01-04 – 2015-01-06 (×3): 5 mL
  Filled 2015-01-04 (×3): qty 5

## 2015-01-04 MED ORDER — VITAL HIGH PROTEIN PO LIQD
1000.0000 mL | ORAL | Status: DC
  Administered 2015-01-04 – 2015-01-05 (×2): 1000 mL
  Filled 2015-01-04 (×4): qty 1000

## 2015-01-04 NOTE — Progress Notes (Addendum)
PULMONARY / CRITICAL CARE MEDICINE   Name: Felicia Holt MRN: [name] DOB: [birth year]    ADMISSION DATE:  12/26/2014 CONSULTATION DATE:  12/28/2014  REFERRING MD :  Dr. Mahala MenghiniSamtani  CHIEF COMPLAINT:  SOB  INITIAL PRESENTATION: 19 year old female admitted with flank and abdominal pain x 1 week. She was admitted for sepsis secondary to suspected pyelonephritis after CT scan. She was placed on broad spectrum antibiotics. 6/28 early AM she became hypoxemic requiring BiPAP. CXR revealed new R > L infiltrates. PCCM consulted.   STUDIES:  6/26 CT abd/pelvis: Bilateral pyelonephritis. Both kidneys are diffusely edematous with multi focal areas of abnormal low-density in both kidneys. New hepatomegaly. New edema of the gallbladder wall. No visible stones. The gallbladder is not distended. 6/28 US renal>>>Heterogeneity of the kidneys consistent with the finding seen on recent CT, Gallbladder wall edema without definitive cholelithiasis. 6/28 dopplers>>>neg lowers 6/28 echo>>>65%, PA 52   SIGNIFICANT EVENTS: 6/28 ett placed, ALI, shock, MODS 6/30 self extubated requiring immediate re-intubation 7/01 Tolerated PS 5-10 cm H2O. Too somnolent to extubate. Dexmedetomidine initiated 01/03/15" Neg 5 liters, to peep 8     SUBJECTIVE/OVERNIGHT/INTERVAL HX 01/04/15: Rn reports not on pressors. Very agitated even with slightest sedation wean. Needing scheduled klonopin + dirpivan gtt + fent gtt  VITAL SIGNS: Temp:  [98.5 F (36.9 C)-100.8 F (38.2 C)] 100.8 F (38.2 C) (07/05 0800) Pulse Rate:  [78-127] 90 (07/05 0900) Resp:  [19-44] 40 (07/05 0900) BP: (78-168)/(35-115) 78/59 mmHg (07/05 0900) SpO2:  [94 %-100 %] 99 % (07/05 0900) FiO2 (%):  [40 %-60 %] 40 % (07/05 0802) Weight:  [112 kg (246 lb 14.6 oz)] 112 kg (246 lb 14.6 oz) (07/05 0200) HEMODYNAMICS: CVP:  [4 mmHg-6 mmHg] 4 mmHg VENTILATOR SETTINGS: Vent Mode:  [-] CPAP;PSV FiO2 (%):  [40 %-60 %] 40 % Set Rate:  [14 bmp] 14 bmp PEEP:  [8  cmH20] 8 cmH20 Pressure Support:  [10 cmH20] 10 cmH20 INTAKE / OUTPUT:  Intake/Output Summary (Last 24 hours) at 01/04/15 1009 Last data filed at 01/04/15 1000  Gross per 24 hour  Intake 4403.83 ml  Output   7490 ml  Net -3086.17 ml    PHYSICAL EXAMINATION: General: NAD. Obese Neuro:  RASS -3 on sedation gtt HEENT ett, perr Cardiovascular:  s1 s2 Reg, no M Lungs: ronchi resolving to coarse Abdomen: Soft, +BS Ext: no edema, warm  LABS:  PULMONARY  Recent Labs Lab 12/30/14 0521 12/30/14 1741 01/01/15 1047 01/01/15 1229 01/02/15 1101  PHART 7.300* 7.327* 7.407 7.351 7.380  PCO2ART 56.9* 54.7* 42.2 49.7* 47.2*  PO2ART 66.8* 232* 43.0* 75.0* 67.0*  HCO3 27.2* 27.8* 26.4* 27.4* 28.0*  TCO2 28.9 29.4 28 29 29   O2SAT 90.4 99.5 78.0 94.0 93.0    CBC  Recent Labs Lab 01/02/15 1700 01/03/15 0340 01/04/15 0440  HGB 8.3* 7.7* 7.8*  HCT 26.5* 24.5* 24.9*  WBC 17.6* 14.7* 12.3*  PLT 222 196 213    COAGULATION  Recent Labs Lab 12/28/14 1129  INR 1.30    CARDIAC   Recent Labs Lab 12/28/14 1129  TROPONINI 0.09*   No results for input(s): PROBNP in the last 168 hours.   CHEMISTRY  Recent Labs Lab 12/31/14 0400 01/01/15 0340 01/02/15 0350 01/03/15 0340 01/04/15 0440  NA 152* 150* 147* 144 143  K 3.5 3.4* 3.3* 3.5 3.6  CL 113* 113* 111 102 101  CO2 31 30 30  33* 36*  GLUCOSE 217* 204* 193* 229* 263*  BUN 30* 27* 20 19  21*  CREATININE 1.51* 1.44* 1.19* 1.17* 1.10*  CALCIUM 8.2* 7.9* 7.9* 7.9* 8.1*  MG  --   --   --  1.8 2.2  PHOS  --   --   --  4.4  --    Estimated Creatinine Clearance: 95.7 mL/min (by C-G formula based on Cr of 1.1).   LIVER  Recent Labs Lab 12/28/14 1129 12/29/14 0230 12/30/14 0430 12/31/14 0400 01/04/15 0440  AST 14* 13* 15 15 30   ALT 18 13* 14 15 28   ALKPHOS 107 105 89 95 73  BILITOT 1.4* 1.1 0.5 0.4 0.4  PROT 5.9* 5.2* 5.4* 5.8* 6.1*  ALBUMIN 1.7* 1.5* 1.6* 1.6* 1.6*  INR 1.30  --   --   --   --       INFECTIOUS  Recent Labs Lab 12/28/14 1127 12/29/14 0230 01/01/15 0340  LATICACIDVEN 0.7  --   --   PROCALCITON  --  1.43 0.46     ENDOCRINE CBG (last 3)   Recent Labs  01/03/15 2317 01/04/15 0340 01/04/15 0744  GLUCAP 213* 247* 225*         IMAGING x48h  - image(s) personally visualized  -   highlighted in bold Dg Chest Port 1 View  01/04/2015   CLINICAL DATA:  Pulmonary edema.  EXAM: PORTABLE CHEST - 1 VIEW  COMPARISON:  01/03/2015.  FINDINGS: Endotracheal tube, NG tube, right IJ line stable position. Cardiomegaly. Persistent bilateral airspace disease. No pleural effusion or pneumothorax.  IMPRESSION: 1. Lines and tubes in stable position. 2. Persistent bilateral airspace disease. 3. Cardiomegaly.   Electronically Signed   By: Maisie Fus  Register   On: 01/04/2015 07:13   Dg Chest Port 1 View  01/03/2015   CLINICAL DATA:  Follow-up endotracheal tube  EXAM: PORTABLE CHEST - 1 VIEW  COMPARISON:  01/02/2015  FINDINGS: Support devices are stable. Low lung volumes. Mild cardiomegaly with vascular congestion and bilateral airspace disease, unchanged. No visible significant effusions.  IMPRESSION: Continued low lung volumes with bilateral airspace disease, not significantly changed.   Electronically Signed   By: Charlett Nose M.D.   On: 01/03/2015 07:34   Dg Chest Port 1 View  01/02/2015   CLINICAL DATA:  Endotracheal tube advancement. Assess support apparatus.  EXAM: PORTABLE CHEST - 1 VIEW  COMPARISON:  01/02/2015 at 0535 hr.  FINDINGS: Support apparatus: Unchanged RIGHT IJ central line with loop at the base of the neck. Endotracheal tube tip 26 mm from the carina. Enteric tube is present with the tip not visible. Monitoring leads project over the chest.  Cardiomediastinal Silhouette:  Unchanged.  Lungs: Unchanged lungs with diffuse bilateral airspace disease and low lung volumes. No pneumothorax.  Effusions:  None.  Other:  None.  IMPRESSION: 1. Advancement of endotracheal tube with  the tip now 26 mm from the carina. 2. Other support apparatus unchanged. 3. Diffuse bilateral airspace disease appears similar. Low lung volumes.   Electronically Signed   By: Andreas Newport M.D.   On: 01/02/2015 10:37         ASSESSMENT / PLAN:  PULMONARY A: Acute hypoxic respiratory failure ARDS, radiographically resolving Failed self extubation 6/30 pulm edema increased   - Agitation and tachypnea precluding wean  P:   Full vent support  CARDIOVASCULAR A:  Overload P:  Monitor with lasix and hypok risk Tele  RENAL A:   AKI, nonoliguric - improving Mild low K  P:   Monitor BMET in am with such neg balance k supp,  mag supp free water kvo all other Lasix maintain  GASTROINTESTINAL A:   Abdominal pain, resolved P:   SUP: enteral famotidine Cont TFs  HEMATOLOGIC A:   Anemia without overt bleeding, dilution P:  DVT px: SQ heparin lasix  INFECTIOUS A:   Severe sepsis  Pyelonephritis, no organism identified P:   Urine 6/29 >> NEG Resp 6/28 >> NEG Blood 6/27 >> NEG  Vanc 6/26 >> 6/30 Pip/tazo 6/26 >> 6/30 Ceftaz 6/30 >> 7/2 ceftriaxone 7/2>>>  Stop date in place, total 10 days required  ENDOCRINE A:   Hyperglycemia, better controlled P:   Cont SSI  NEUROLOGIC A:   Agitation Acute encephalopathy - gets very agitated  P:   RASS goal: - 1 Propofol with WUA, can wean on low dose Low dose scheduled clonazepam initiated 7/01 Fent Start schedule haldol 01/04/2015    FAMILY: Mother updates: -  01/04/2015 via phone from bedside       The patient is critically ill with multiple organ systems failure and requires high complexity decision making for assessment and support, frequent evaluation and titration of therapies, application of advanced monitoring technologies and extensive interpretation of multiple databases.   Critical Care Time devoted to patient care services described in this note is  30  Minutes. This time reflects time of  care of this signee Dr Kalman Shan. This critical care time does not reflect procedure time, or teaching time or supervisory time of PA/NP/Med student/Med Resident etc but could involve care discussion time    Dr. Kalman Shan, M.D., [facility].C.P Pulmonary and Critical Care Medicine Staff Physician [facility] [place] Pulmonary and Critical Care Pager: [phone], If no answer or between  15:00h - 7:00h: call [phone]  01/04/2015 10:24 AM

## 2015-01-04 NOTE — Progress Notes (Signed)
Nutrition Follow-up  DOCUMENTATION CODES:  Morbid obesity  INTERVENTION:  Needs bowel regimen (no bm x 9 days) - discussed during rounds  Decrease Vital High Protein to 20 ml/hr 60 ml Prostat TID  Provides: 1080 kcal, 132 grams protein, and 401 ml H2O TF regimen and propofol at current rate providing 1647 total kcal/day (14.7 kcal/kg of actual body weight) Total free water: 1601 ml   NUTRITION DIAGNOSIS:  Inadequate oral intake related to inability to eat as evidenced by NPO status.  ongoing  GOAL:  Provide needs based on ASPEN/SCCM guidelines  Met.   MONITOR:  TF tolerance, Vent status, Labs, I & O's  REASON FOR ASSESSMENT:  Consult Enteral/tube feeding initiation and management  ASSESSMENT:  Pt admitted due to flank and abd pain x 1 week, found to have sepsis secondary to suspected pyelonephritis after CT scan. Early 6/28 pt became hypoxemic requiring BiPAP, then eventually intubated with ARDS.  Per MD ARDS improving but agitation preventing extubation.   Patient is currently intubated on ventilator support MV: 13.1 L/min Temp (24hrs), Avg:99.4 F (37.4 C), Min:98.5 F (36.9 C), Max:100.8 F (38.2 C)  Propofol: 21.5 ml/hr provides: 567 kcal per day from lipid Pt discussed during ICU rounds and with RN.  OG tube Drips: fentanyl and propofol (7/2) Labs reviewed: CBG's: 213-247; TG 306  Vital High Protein at 60 ml/hr provides: 1440 kcal, 126 grams of protein, and 1203 ml of H2O.  Free water: 200 ml every 4 hours: 1200 ml  Height:  Ht Readings from Last 1 Encounters:  01/01/15 _0  (1.575 m)    Weight:  Wt Readings from Last 1 Encounters:  01/04/15 246 lb 14.6 oz (112 kg)    Ideal Body Weight:  50 kg  Wt Readings from Last 10 Encounters:  01/04/15 246 lb 14.6 oz (112 kg)  04/14/14 229 lb (103.874 kg)  03/25/14 230 lb (104.327 kg)  02/25/14 224 lb (101.606 kg)  02/11/14 249 lb (112.946 kg)  02/05/14 286 lb 11.2 oz (130.046 kg)  01/31/14  282 lb 9.6 oz (128.187 kg)  01/26/14 260 lb (117.935 kg)  01/25/14 260 lb (117.935 kg)  01/21/14 262 lb (118.842 kg)    BMI:  Body mass index is 45.15 kg/(m^2).  Estimated Nutritional Needs:  Kcal:  6546-5035  Protein:  >/= 125 grams  Fluid:  > 1.5 L/day  Skin:  Reviewed, no issues  Diet Order:    NPO  EDUCATION NEEDS:  No education needs identified at this time   Last BM:  PTA  [place], [place], [place] Pager [phone] After Hours Pager

## 2015-01-04 NOTE — Progress Notes (Signed)
Inpatient Diabetes Program Recommendations  AACE/ADA: New Consensus Statement on Inpatient Glycemic Control (2013)  Target Ranges:  Prepandial:   less than 140 mg/dL      Peak postprandial:   less than 180 mg/dL (1-2 hours)      Critically ill patients:  140 - 180 mg/dL   Reason for Visit: Hyperglycemia  Results for Donna ChristenJACKSON, Anyelina (MRN [id number]) as of 01/04/2015 15:18  Ref. Range 01/03/2015 19:32 01/03/2015 23:17 01/04/2015 03:40 01/04/2015 07:44 01/04/2015 12:51  Glucose-Capillary Latest Ref Range: 65-99 mg/dL 829234 (H) 562213 (H) 130247 (H) 225 (H) 227 (H)    Inpatient Diabetes Program Recommendations Correction (SSI): . Insulin - Meal Coverage: add Novolog 3 units Q4 for tube feed coverage   Note: Will continue to follow. Thank you. Ailene Ardshonda Kienan Doublin, RD, LDN, CDE Inpatient Diabetes Coordinator [phone]

## 2015-01-05 ENCOUNTER — Inpatient Hospital Stay (HOSPITAL_COMMUNITY): Payer: Medicaid Other

## 2015-01-05 DIAGNOSIS — G9340 Encephalopathy, unspecified: Secondary | ICD-10-CM

## 2015-01-05 LAB — CBC WITH DIFFERENTIAL/PLATELET
Basophils Absolute: 0 10*3/uL (ref 0.0–0.1)
Basophils Relative: 0 % (ref 0–1)
Eosinophils Absolute: 0.3 10*3/uL (ref 0.0–0.7)
Eosinophils Relative: 2 % (ref 0–5)
HEMATOCRIT: 28.1 % — AB (ref 36.0–46.0)
HEMOGLOBIN: 8.7 g/dL — AB (ref 12.0–15.0)
LYMPHS PCT: 26 % (ref 12–46)
Lymphs Abs: 3.3 10*3/uL (ref 0.7–4.0)
MCH: 26 pg (ref 26.0–34.0)
MCHC: 31 g/dL (ref 30.0–36.0)
MCV: 83.9 fL (ref 78.0–100.0)
MONO ABS: 0.6 10*3/uL (ref 0.1–1.0)
MONOS PCT: 5 % (ref 3–12)
NEUTROS ABS: 8.6 10*3/uL — AB (ref 1.7–7.7)
Neutrophils Relative %: 67 % (ref 43–77)
Platelets: 268 10*3/uL (ref 150–400)
RBC: 3.35 MIL/uL — ABNORMAL LOW (ref 3.87–5.11)
RDW: 15.4 % (ref 11.5–15.5)
WBC: 12.8 10*3/uL — AB (ref 4.0–10.5)

## 2015-01-05 LAB — MAGNESIUM: Magnesium: 2.2 mg/dL (ref 1.7–2.4)

## 2015-01-05 LAB — BASIC METABOLIC PANEL
Anion gap: 10 (ref 5–15)
BUN: 30 mg/dL — AB (ref 6–20)
CALCIUM: 8.6 mg/dL — AB (ref 8.9–10.3)
CO2: 35 mmol/L — ABNORMAL HIGH (ref 22–32)
Chloride: 101 mmol/L (ref 101–111)
Creatinine, Ser: 1.06 mg/dL — ABNORMAL HIGH (ref 0.44–1.00)
GFR calc Af Amer: >60 mL/min (ref >60)
GLUCOSE: 174 mg/dL — AB (ref 65–99)
POTASSIUM: 4 mmol/L (ref 3.5–5.1)
Sodium: 146 mmol/L — ABNORMAL HIGH (ref 135–145)

## 2015-01-05 LAB — GLUCOSE, CAPILLARY
GLUCOSE-CAPILLARY: 152 mg/dL — AB (ref 65–99)
Glucose-Capillary: 176 mg/dL — ABNORMAL HIGH (ref 65–99)
Glucose-Capillary: 178 mg/dL — ABNORMAL HIGH (ref 65–99)
Glucose-Capillary: 156 mg/dL — ABNORMAL HIGH (ref 65–99)
Glucose-Capillary: 170 mg/dL — ABNORMAL HIGH (ref 65–99)

## 2015-01-05 LAB — PHOSPHORUS: Phosphorus: 4.5 mg/dL (ref 2.5–4.6)

## 2015-01-05 MED ORDER — SODIUM CHLORIDE 0.9 % IV SOLN
1.0000 mg/h | INTRAVENOUS | Status: DC
  Administered 2015-01-05: 4 mg/h via INTRAVENOUS
  Administered 2015-01-06: 2 mg/h via INTRAVENOUS
  Filled 2015-01-05 (×2): qty 5

## 2015-01-05 MED ORDER — HYDROMORPHONE HCL 1 MG/ML IJ SOLN: 1.0000 mg | INTRAMUSCULAR | Status: DC | PRN

## 2015-01-05 NOTE — Progress Notes (Signed)
While in room giving am medications, patient very agitated propofol  At 30 mcg/hr, she is awake, eyes open, doesn't follow commands, very combative, swung and hit in shoulder and jaw.  Increased propofol to 40 mcg patient is resting in bed currently, family members at bedside asleep.  Didn't cut sedation down due to her combativeness with any drop in sedation and analgesics.

## 2015-01-05 NOTE — Progress Notes (Signed)
PULMONARY / CRITICAL CARE MEDICINE   Name: Felicia Holt MRN: [id number] DOB: 07-23-1995    ADMISSION DATE:  12/26/2014 CONSULTATION DATE:  12/28/2014  REFERRING MD :  Dr. Mahala Menghini  CHIEF COMPLAINT:  SOB  INITIAL PRESENTATION: 19 year old female admitted with flank and abdominal pain x 1 week. She was admitted for sepsis secondary to suspected pyelonephritis after CT scan. She was placed on broad spectrum antibiotics. 6/28 early AM she became hypoxemic requiring BiPAP. CXR revealed new R > L infiltrates. PCCM consulted.   STUDIES:  6/26 CT abd/pelvis: Bilateral pyelonephritis. Both kidneys are diffusely edematous with multi focal areas of abnormal low-density in both kidneys. New hepatomegaly. New edema of the gallbladder wall. No visible stones. The gallbladder is not distended. 6/28 US renal>>>Heterogeneity of the kidneys consistent with the finding seen on recent CT, Gallbladder wall edema without definitive cholelithiasis. 6/28 dopplers>>>neg lowers 6/28 echo>>>65%, PA 52   SIGNIFICANT EVENTS: 6/28 ett placed, ALI, shock, MODS 6/30 self extubated requiring immediate re-intubation 7/01 Tolerated PS 5-10 cm H2O. Too somnolent to extubate. Dexmedetomidine initiated 01/03/15" Neg 5 liters, to peep 8  01/04/15: Rn reports not on pressors. Very agitated even with slightest sedation wean. Needing scheduled klonopin + dirpivan gtt + fent gtt    SUBJECTIVE/OVERNIGHT/INTERVAL HX 01/05/15: Very agitated even with slightest sedatiin wean. On diporivan and fent gtt + scheduled haldol.   VITAL SIGNS: Temp:  [98.4 F (36.9 C)-99.4 F (37.4 C)] 98.4 F (36.9 C) (07/06 0700) Pulse Rate:  [85-128] 92 (07/06 0927) Resp:  [15-30] 19 (07/06 0927) BP: (93-139)/(31-84) 115/60 mmHg (07/06 0927) SpO2:  [99 %-100 %] 100 % (07/06 0927) FiO2 (%):  [40 %] 40 % (07/06 0927) Weight:  [110.5 kg (243 lb 9.7 oz)] 110.5 kg (243 lb 9.7 oz) (07/06 0400) HEMODYNAMICS: CVP:  [1 mmHg-8 mmHg] 1 mmHg VENTILATOR  SETTINGS: Vent Mode:  [-] PSV;CPAP FiO2 (%):  [40 %] 40 % PEEP:  [8 cmH20] 8 cmH20 Pressure Support:  [10 cmH20] 10 cmH20 INTAKE / OUTPUT:  Intake/Output Summary (Last 24 hours) at 01/05/15 1051 Last data filed at 01/05/15 1037  Gross per 24 hour  Intake 2760.09 ml  Output   6080 ml  Net -3319.91 ml    PHYSICAL EXAMINATION: General: NAD. Obese Neuro:  RASS -3 on sedation gtt HEENT ett, perr Cardiovascular:  s1 s2 Reg, no M Lungs: ronchi resolving to coarse Abdomen: Soft, +BS Ext: no edema, warm  LABS:  PULMONARY  Recent Labs Lab 12/30/14 0521 12/30/14 1741 01/01/15 1047 01/01/15 1229 01/02/15 1101  PHART 7.300* 7.327* 7.407 7.351 7.380  PCO2ART 56.9* 54.7* 42.2 49.7* 47.2*  PO2ART 66.8* 232* 43.0* 75.0* 67.0*  HCO3 27.2* 27.8* 26.4* 27.4* 28.0*  TCO2 28.9 29.4 28 29 29   O2SAT 90.4 99.5 78.0 94.0 93.0    CBC  Recent Labs Lab 01/03/15 0340 01/04/15 0440 01/05/15 0400  HGB 7.7* 7.8* 8.7*  HCT 24.5* 24.9* 28.1*  WBC 14.7* 12.3* 12.8*  PLT 196 213 268    COAGULATION No results for input(s): INR in the last 168 hours.  CARDIAC  No results for input(s): TROPONINI in the last 168 hours. No results for input(s): PROBNP in the last 168 hours.   CHEMISTRY  Recent Labs Lab 01/01/15 0340 01/02/15 0350 01/03/15 0340 01/04/15 0440 01/05/15 0400  NA 150* 147* 144 143 146*  K 3.4* 3.3* 3.5 3.6 4.0  CL 113* 111 102 101 101  CO2 30 30 33* 36* 35*  GLUCOSE 204* 193* 229* 263* 174*  BUN 27* 20 19 21* 30*  CREATININE 1.44* 1.19* 1.17* 1.10* 1.06*  CALCIUM 7.9* 7.9* 7.9* 8.1* 8.6*  MG  --   --  1.8 2.2 2.2  PHOS  --   --  4.4  --  4.5   Estimated Creatinine Clearance: 98.5 mL/min (by C-G formula based on Cr of 1.06).   LIVER  Recent Labs Lab 12/30/14 0430 12/31/14 0400 01/04/15 0440  AST 15 15 30   ALT 14 15 28   ALKPHOS 89 95 73  BILITOT 0.5 0.4 0.4  PROT 5.4* 5.8* 6.1*  ALBUMIN 1.6* 1.6* 1.6*     INFECTIOUS  Recent Labs Lab  01/01/15 0340  PROCALCITON 0.46     ENDOCRINE CBG (last 3)   Recent Labs  01/04/15 1945 01/04/15 2327 01/05/15 0734  GLUCAP 162* 173* 176*         IMAGING x48h  - image(s) personally visualized  -   highlighted in bold Dg Chest Port 1 View  01/05/2015   CLINICAL DATA:  Hypoxia  EXAM: PORTABLE CHEST - 1 VIEW  COMPARISON:  January 04, 2015.  FINDINGS: Endotracheal tube tip is 2.0 cm above the carina. Nasogastric tube tip and side port are below the diaphragm. Central catheter tip is in the superior vena cava. No pneumothorax. There is mild atelectatic change in the left base. Lungs elsewhere clear. Heart is prominent with pulmonary vascularity within normal limits. No adenopathy.  IMPRESSION: Interval clearing of airspace opacity bilaterally. There persistent left base atelectasis. Tube and catheter positions as described without pneumothorax.   Electronically Signed   By: Bretta Bang III M.D.   On: 01/05/2015 07:03   Dg Chest Port 1 View  01/04/2015   CLINICAL DATA:  Pulmonary edema.  EXAM: PORTABLE CHEST - 1 VIEW  COMPARISON:  01/03/2015.  FINDINGS: Endotracheal tube, NG tube, right IJ line stable position. Cardiomegaly. Persistent bilateral airspace disease. No pleural effusion or pneumothorax.  IMPRESSION: 1. Lines and tubes in stable position. 2. Persistent bilateral airspace disease. 3. Cardiomegaly.   Electronically Signed   By: Maisie Fus  Register   On: 01/04/2015 07:13         ASSESSMENT / PLAN:  PULMONARY A: Acute hypoxic respiratory failure ARDS, radiographically resolving Failed self extubation 6/30 pulm edema increased   - Agitation and tachypnea precluding wean  P:   Full vent support  CARDIOVASCULAR A:  Overload P:  Monitor with lasix and hypok risk Tele  RENAL A:   AKI, nonoliguric - rsolved   P:   Monitor BMET in am with such neg balance k supp, mag supp free water kvo all other Lasix maintain  GASTROINTESTINAL A:   Abdominal pain,  resolved P:   SUP: enteral famotidine Cont TFs  HEMATOLOGIC A:   Anemia without overt bleeding, dilution P:  DVT px: SQ heparin lasix  INFECTIOUS A:   Severe sepsis  Pyelonephritis, no organism identified P:   Urine 6/29 >> NEG Resp 6/28 >> NEG Blood 6/27 >> NEG  Vanc 6/26 >> 6/30 Pip/tazo 6/26 >> 6/30 Ceftaz 6/30 >> 7/2 ceftriaxone 7/2>>> Stop date in place, total 10 days required  ENDOCRINE A:   Hyperglycemia, better controlled P:   Cont SSI  NEUROLOGIC A:   Agitation Acute encephalopathy - gets very agitated   - no improvement in agitation despite addition of haldol 01/04/15  P:   RASS goal: - 1 Propofol with WUA, can wean on low dose -> check CK/lactate 01/06/15 Low dose scheduled clonazepam initiated 7/01 Fent gtt ->  rotate out to dilaudid gtt 01/05/15 Cotninue  schedule haldolsince  01/04/2015    FAMILY: Mother updates: -  01/04/2015 and 01/05/15 at bedside     The patient is critically ill with multiple organ systems failure and requires high complexity decision making for assessment and support, frequent evaluation and titration of therapies, application of advanced monitoring technologies and extensive interpretation of multiple databases.   Critical Care Time devoted to patient care services described in this note is  30  Minutes. This time reflects time of care of this signee Dr Kalman ShanMurali Vasti Yagi. This critical care time does not reflect procedure time, or teaching time or supervisory time of PA/NP/Med student/Med Resident etc but could involve care discussion time    Dr. Kalman ShanMurali Kaz Auld, M.D., [facility].C.C.P Pulmonary and Critical Care Medicine Staff Physician [place] [facility] [place] Pulmonary and Critical Care Pager: [phone], If no answer or between  15:00h - 7:00h: call [phone]  01/05/2015 10:51 AM

## 2015-01-05 NOTE — Progress Notes (Signed)
Pt was very anxious and she is now sedated so pt was flipped to FS. Pt is on PCV 12/8 O2 saturations are 100%

## 2015-01-06 ENCOUNTER — Inpatient Hospital Stay (HOSPITAL_COMMUNITY): Payer: Medicaid Other

## 2015-01-06 DIAGNOSIS — R1012 Left upper quadrant pain: Secondary | ICD-10-CM

## 2015-01-06 LAB — GLUCOSE, CAPILLARY
GLUCOSE-CAPILLARY: 162 mg/dL — AB (ref 65–99)
GLUCOSE-CAPILLARY: 165 mg/dL — AB (ref 65–99)
GLUCOSE-CAPILLARY: 192 mg/dL — AB (ref 65–99)
GLUCOSE-CAPILLARY: 149 mg/dL — AB (ref 65–99)
Glucose-Capillary: 148 mg/dL — ABNORMAL HIGH (ref 65–99)
Glucose-Capillary: 154 mg/dL — ABNORMAL HIGH (ref 65–99)

## 2015-01-06 LAB — CBC WITH DIFFERENTIAL/PLATELET
Basophils Absolute: 0 10*3/uL (ref 0.0–0.1)
Basophils Relative: 0 % (ref 0–1)
EOS ABS: 0.2 10*3/uL (ref 0.0–0.7)
EOS PCT: 2 % (ref 0–5)
HEMATOCRIT: 29.7 % — AB (ref 36.0–46.0)
Hemoglobin: 9.2 g/dL — ABNORMAL LOW (ref 12.0–15.0)
LYMPHS PCT: 26 % (ref 12–46)
Lymphs Abs: 2.5 10*3/uL (ref 0.7–4.0)
MCH: 26.6 pg (ref 26.0–34.0)
MCHC: 31 g/dL (ref 30.0–36.0)
MCV: 85.8 fL (ref 78.0–100.0)
MONO ABS: 0.7 10*3/uL (ref 0.1–1.0)
Monocytes Relative: 7 % (ref 3–12)
Neutro Abs: 6.5 10*3/uL (ref 1.7–7.7)
Neutrophils Relative %: 65 % (ref 43–77)
PLATELETS: 300 10*3/uL (ref 150–400)
RBC: 3.46 MIL/uL — ABNORMAL LOW (ref 3.87–5.11)
RDW: 14.7 % (ref 11.5–15.5)
WBC: 9.9 10*3/uL (ref 4.0–10.5)

## 2015-01-06 LAB — BASIC METABOLIC PANEL
Anion gap: 8 (ref 5–15)
BUN: 35 mg/dL — ABNORMAL HIGH (ref 6–20)
CHLORIDE: 101 mmol/L (ref 101–111)
CO2: 35 mmol/L — ABNORMAL HIGH (ref 22–32)
CREATININE: 0.99 mg/dL (ref 0.44–1.00)
Calcium: 8.9 mg/dL (ref 8.9–10.3)
GFR calc Af Amer: >60 mL/min (ref >60)
GFR calc non Af Amer: >60 mL/min (ref >60)
Glucose, Bld: 181 mg/dL — ABNORMAL HIGH (ref 65–99)
Potassium: 4.1 mmol/L (ref 3.5–5.1)
SODIUM: 144 mmol/L (ref 135–145)

## 2015-01-06 LAB — CK TOTAL AND CKMB (NOT AT ARMC)
CK, MB: 1.4 ng/mL (ref 0.5–5.0)
Relative Index: 0.2 (ref 0.0–2.5)
Total CK: 761 U/L — ABNORMAL HIGH (ref 38–234)

## 2015-01-06 LAB — LACTIC ACID, PLASMA: Lactic Acid, Venous: 0.6 mmol/L (ref 0.5–2.0)

## 2015-01-06 LAB — MAGNESIUM: Magnesium: 2.4 mg/dL (ref 1.7–2.4)

## 2015-01-06 LAB — TRIGLYCERIDES: Triglycerides: 474 mg/dL — ABNORMAL HIGH (ref <150)

## 2015-01-06 LAB — PHOSPHORUS: Phosphorus: 5.2 mg/dL — ABNORMAL HIGH (ref 2.5–4.6)

## 2015-01-06 MED ORDER — FUROSEMIDE 10 MG/ML IJ SOLN
40.0000 mg | Freq: Two times a day (BID) | INTRAMUSCULAR | Status: DC
  Administered 2015-01-06 – 2015-01-07 (×3): 40 mg via INTRAVENOUS
  Filled 2015-01-06 (×4): qty 4

## 2015-01-06 MED ORDER — ADULT MULTIVITAMIN W/MINERALS CH
1.0000 | ORAL_TABLET | Freq: Every day | ORAL | Status: DC
  Administered 2015-01-07 – 2015-01-10 (×4): 1 via ORAL
  Filled 2015-01-06 (×4): qty 1

## 2015-01-06 NOTE — Procedures (Signed)
Extubation Procedure Note  Patient Details:   Name: Felicia ChristenJanai Holt DOB: March 12, 1996 MRN: [id number]   Airway Documentation:     Evaluation  O2 sats: stable throughout Complications: No apparent complications Patient did tolerate procedure well. Bilateral Breath Sounds: Diminished Suctioning: Airway, Oral Yes   PT was extubated to 3L [place]  PT was able to tell us her name. Sats stable  Jerol Rufener, Duane LopeJeffrey D 01/06/2015, 12:21 PM

## 2015-01-06 NOTE — Progress Notes (Signed)
eLink Physician-Brief Progress Note Patient Name: Felicia ChristenJanai Holt DOB: [birth year] MRN: [id number]   Date of Service  01/06/2015  HPI/Events of Note  Patient extubated today, was asleep earlier, found on the floor sitting with leads off, clothes off, but able to state that she hit her head.  She was reaching for ice and fell out of the bed, per the patient.   eICU Interventions  CT head  Patient is mildly confused, but following simple commands.  Able to state DOB, and location.      Intervention Category Major Interventions: Other:  Felicia Holt 01/06/2015, 7:08 PM

## 2015-01-06 NOTE — Progress Notes (Signed)
patietn extubated, dr Tyson Aliasfeinstein in room at bedside along with mother and sister, she was able to follow commands breathing on her own prior to extubation, she tolerated well, currently on 3l via [place], alert and oriented x3, family remains at bedside, able to cough up secretions and family is helping her with yaunker.  Will continue to monitor

## 2015-01-06 NOTE — Progress Notes (Signed)
PULMONARY / CRITICAL CARE MEDICINE   Name: Felicia ChristenJanai Holt MRN: [id number] DOB: 1996-01-27    ADMISSION DATE:  12/26/2014 CONSULTATION DATE:  12/28/2014  REFERRING MD :  Dr. Mahala MenghiniSamtani  CHIEF COMPLAINT:  SOB  INITIAL PRESENTATION: 19 year old female admitted with flank and abdominal pain x 1 week. She was admitted for sepsis secondary to suspected pyelonephritis after CT scan. She was placed on broad spectrum antibiotics. 6/28 early AM she became hypoxemic requiring BiPAP. CXR revealed new R > L infiltrates. PCCM consulted.   STUDIES:  6/26 CT abd/pelvis: Bilateral pyelonephritis. Both kidneys are diffusely edematous with multi focal areas of abnormal low-density in both kidneys. New hepatomegaly. New edema of the gallbladder wall. No visible stones. The gallbladder is not distended. 6/28 US renal>>>Heterogeneity of the kidneys consistent with the finding seen on recent CT, Gallbladder wall edema without definitive cholelithiasis. 6/28 dopplers>>>neg lowers 6/28 echo>>>65%, PA 52   SIGNIFICANT EVENTS: 6/28 ett placed, ALI, shock, MODS 6/30 self extubated requiring immediate re-intubation 7/01 Tolerated PS 5-10 cm H2O. Too somnolent to extubate. Dexmedetomidine initiated 01/03/15" Neg 5 liters, to peep 8  01/04/15: Rn reports not on pressors. Very agitated even with slightest sedation wean. Needing scheduled klonopin + dirpivan gtt + fent gtt    SUBJECTIVE/OVERNIGHT/INTERVAL HX More calm, pcxr better, neg balance  VITAL SIGNS: Temp:  [98.2 F (36.8 C)-99.2 F (37.3 C)] 98.6 F (37 C) (07/07 1146) Pulse Rate:  [72-117] 93 (07/07 1000) Resp:  [12-30] 30 (07/07 1000) BP: (94-144)/(34-88) 132/83 mmHg (07/07 1000) SpO2:  [100 %] 100 % (07/07 1000) FiO2 (%):  [40 %] 40 % (07/07 0845) Weight:  [108 kg (238 lb 1.6 oz)] 108 kg (238 lb 1.6 oz) (07/07 0300) HEMODYNAMICS: CVP:  [10 mmHg] 10 mmHg VENTILATOR SETTINGS: Vent Mode:  [-] PCV FiO2 (%):  [40 %] 40 % Set Rate:  [14 bmp] 14  bmp PEEP:  [8 cmH20] 8 cmH20 Pressure Support:  [10 cmH20] 10 cmH20 Plateau Pressure:  [16 cmH20-24 cmH20] 16 cmH20 INTAKE / OUTPUT:  Intake/Output Summary (Last 24 hours) at 01/06/15 1155 Last data filed at 01/06/15 1000  Gross per 24 hour  Intake 2950.55 ml  Output   2730 ml  Net 220.55 ml    PHYSICAL EXAMINATION: General: NAD. Obese Neuro:  RASS -1 on sedation gtt HEENT ett, perr Cardiovascular:  s1 s2 Reg, no M Lungs: distant clear Abdomen: Soft, +BS Ext: no edema, warm  LABS:  PULMONARY  Recent Labs Lab 12/30/14 1741 01/01/15 1047 01/01/15 1229 01/02/15 1101  PHART 7.327* 7.407 7.351 7.380  PCO2ART 54.7* 42.2 49.7* 47.2*  PO2ART 232* 43.0* 75.0* 67.0*  HCO3 27.8* 26.4* 27.4* 28.0*  TCO2 29.4 28 29 29   O2SAT 99.5 78.0 94.0 93.0    CBC  Recent Labs Lab 01/04/15 0440 01/05/15 0400 01/06/15 0350  HGB 7.8* 8.7* 9.2*  HCT 24.9* 28.1* 29.7*  WBC 12.3* 12.8* 9.9  PLT 213 268 300    COAGULATION No results for input(s): INR in the last 168 hours.  CARDIAC  No results for input(s): TROPONINI in the last 168 hours. No results for input(s): PROBNP in the last 168 hours.   CHEMISTRY  Recent Labs Lab 01/02/15 0350 01/03/15 0340 01/04/15 0440 01/05/15 0400 01/06/15 0350  NA 147* 144 143 146* 144  K 3.3* 3.5 3.6 4.0 4.1  CL 111 102 101 101 101  CO2 30 33* 36* 35* 35*  GLUCOSE 193* 229* 263* 174* 181*  BUN 20 19 21* 30* 35*  CREATININE 1.19* 1.17* 1.10* 1.06* 0.99  CALCIUM 7.9* 7.9* 8.1* 8.6* 8.9  MG  --  1.8 2.2 2.2 2.4  PHOS  --  4.4  --  4.5 5.2*   Estimated Creatinine Clearance: 104 mL/min (by C-G formula based on Cr of 0.99).   LIVER  Recent Labs Lab 12/31/14 0400 01/04/15 0440  AST 15 30  ALT 15 28  ALKPHOS 95 73  BILITOT 0.4 0.4  PROT 5.8* 6.1*  ALBUMIN 1.6* 1.6*     INFECTIOUS  Recent Labs Lab 01/01/15 0340 01/05/15  LATICACIDVEN  --  0.6  PROCALCITON 0.46  --      ENDOCRINE CBG (last 3)   Recent Labs   01/05/15 2350 01/06/15 0352 01/06/15 0736  GLUCAP 148* 154* 162*         IMAGING x48h  - image(s) personally visualized  -   highlighted in bold Dg Chest Port 1 View  01/06/2015   CLINICAL DATA:  Follow-up of respiratory failure, ARDS  EXAM: PORTABLE CHEST - 1 VIEW  COMPARISON:  Portable chest x-ray of January 05, 2015  FINDINGS: The lungs or remain borderline hypoinflated. The interstitial markings are slightly less conspicuous today. There is no alveolar infiltrate. The cardiac silhouette is mildly enlarged though stable. The pulmonary vascularity is not engorged.  The endotracheal tube tip lies approximately 3 cm above the carina. The esophagogastric tube tip projects at the level of the GE junction but the proximal port is above the GE junction. The right internal jugular venous catheter tip projects over the midportion of the SVC.  IMPRESSION: 1. Improved appearance of the pulmonary interstitium despite persistent mild hypo inflation. 2. The endotracheal tube is in reasonable position. The esophagogastric tube should be advanced 10-20 cm to assure that the proximal port lies below the GE junction.   Electronically Signed   By: David  [place] M.D.   On: 01/06/2015 07:38   Dg Chest Port 1 View  01/05/2015   CLINICAL DATA:  Hypoxia  EXAM: PORTABLE CHEST - 1 VIEW  COMPARISON:  January 04, 2015.  FINDINGS: Endotracheal tube tip is 2.0 cm above the carina. Nasogastric tube tip and side port are below the diaphragm. Central catheter tip is in the superior vena cava. No pneumothorax. There is mild atelectatic change in the left base. Lungs elsewhere clear. Heart is prominent with pulmonary vascularity within normal limits. No adenopathy.  IMPRESSION: Interval clearing of airspace opacity bilaterally. There persistent left base atelectasis. Tube and catheter positions as described without pneumothorax.   Electronically Signed   By: Bretta Bang III M.D.   On: 01/05/2015 07:03         ASSESSMENT /  PLAN:  PULMONARY A: Acute hypoxic respiratory failure ARDS, radiographically resolving Failed self extubation 6/30 pulm edema better  P:   If needed to 50% to get to peep 5, goal is peep to 5 Wean cpap5 ps 5, goal 30 min Neurostatus improved, pcxr better after neg balance Neg balance goals Chair position Leak test  CARDIOVASCULAR A:  Overload P:  Tele  RENAL A:   AKI, nonoliguric - resolved   P:   Monitor BMET in am free water dc Lasix maintain  GASTROINTESTINAL A:   Abdominal pain, resolved P:   SUP: enteral famotidine Cont TFs, hold as weaning well  HEMATOLOGIC A:   Anemia without overt bleeding, dilution P:  DVT px: SQ heparin Lasix, hct rising  INFECTIOUS A:   Severe sepsis  Pyelonephritis, no organism identified P:  Urine 6/29 >> NEG Resp 6/28 >> NEG Blood 6/27 >> NEG  Vanc 6/26 >> 6/30 Pip/tazo 6/26 >> 6/30 Ceftaz 6/30 >> 7/2 ceftriaxone 7/2>>>alloed to dc after 10 days  ENDOCRINE A:   Hyperglycemia, better controlled P:   Cont SSI  NEUROLOGIC A:   Agitation Acute encephalopathy - gets very agitated   P:   RASS goal: - 1 Propofol with WUA, can wean on low dose -> check CK/lactate 01/06/15 Low dose scheduled clonazepam initiated 7/01 Improved with dilaudid gtt 01/05/15 Cotninue  schedule haldolsince  01/04/2015, may change to prn  FAMILY: Mother updated daily   Ccm time 30 min   Mcarthur Rossetti. Tyson Alias, MD, FACP Pgr: [phone] [facility]

## 2015-01-06 NOTE — Progress Notes (Signed)
Patient found in floor c. 1900, sitting straight up, gown removed, tele leads off, IV intact, foley intact, patient alert and able to answer basic questions.  When asked if anything hurt, she said no, when asked about how she fell into the floor she stated she didn't know.  Assisted patient to standing position and back to bed with the assistance of 4 other nurses and gait belt.  Once back in bed, repositioned and sitting up ask again about pain specifically with her back she said yes, and when questioned if she hit her head she stated yes. Dr Dema Severinmungal (e-link) notified and he camered in to the room and assessed patient, her voice is weak (d/t to post extubation today at 1230) but she was able to answer basic questions for him as well.  He is to order head CT and will follow up with her.  Notified her mother by phone after checking the waiting room for family at 1929, she stated she would come back up shortly, she is aware patient will be going off the unit to radiology.

## 2015-01-06 NOTE — Progress Notes (Signed)
   01/06/15 1930  What Happened  Was fall witnessed? No  Was patient injured? No  Patient found on floor  Found by Staff-comment  Stated prior activity to/from bed, chair, or stretcher  Follow Up  MD notified Dr Dema Severinmungal (elink)   Time MD notified [phone]  Family notified Yes-comment  Time family notified 0730  Additional tests Yes-comment  Adult Fall Risk Assessment  Risk Factor Category (scoring not indicated) High fall risk per protocol (document High fall risk)  Patient's Fall Risk High Fall Risk (>13 points)  Adult Fall Risk Interventions  Required Bundle Interventions *See Row Information* High fall risk - low, moderate, and high requirements implemented  Additional Interventions Fall risk signage;PT/OT need assessed if change in mobility from baseline;Secure all tubes/drains  Fall with Injury Screening  Risk For Fall Injury- See Row Information  Nurse judgement  Vitals  Temp 98.6 F (37 C)  Temp Source Axillary  BP 139/82 mmHg  MAP (mmHg) 100  BP Location Left Wrist  BP Method Automatic  Patient Position (if appropriate) Sitting  Pulse Rate 90  Pulse Rate Source Monitor  ECG Heart Rate 86  Cardiac Rhythm NSR  Resp 13  Oxygen Therapy  SpO2 100 %  O2 Device Nasal Cannula  O2 Flow Rate (L/min) 2 L/min  Pulse Oximetry Type Continuous  Oximetry Probe Site Changed Yes  Pain Assessment  Pain Assessment 0-10  Pain Score 2  Pain Type Acute pain  Pain Location Back  Critical Care Pain Observation Tool (CPOT)  Facial Expression 0  Body Movements 0  Muscle Tension 0  Compliance with ventilator (intubated pts.) N/A  Vocalization (extubated pts.) 0  CPOT Total 0  Neurological  Neuro (WDL) X  Level of Consciousness Alert  Orientation Level Oriented to person;Oriented to place;Disoriented to time;Disoriented to situation  Cognition Follows commands;Impulsive;Poor judgement;Poor safety awareness  Speech Clear  Pupil Assessment  Yes  R Pupil Size (mm) 3  R Pupil Shape  Round  R Pupil Reaction Brisk  L Pupil Size (mm) 3  L Pupil Shape Round  L Pupil Reaction Brisk  Glasgow Coma Scale  Eye Opening 4  Best Verbal Response (NON-intubated) 5  Best Motor Response 6  Glasgow Coma Scale Score 15  Musculoskeletal  Musculoskeletal (WDL) X  Generalized Weakness Yes  Integumentary  Integumentary (WDL) WDL  Pain Assessment  Date Pain First Started 01/06/15  Result of Injury No  Pain Screening  Clinical Progression Not changed  Pain Assessment  Work-Related Injury No

## 2015-01-07 DIAGNOSIS — R1013 Epigastric pain: Secondary | ICD-10-CM

## 2015-01-07 LAB — CBC WITH DIFFERENTIAL/PLATELET
Basophils Absolute: 0 10*3/uL (ref 0.0–0.1)
Basophils Relative: 0 % (ref 0–1)
EOS ABS: 0.1 10*3/uL (ref 0.0–0.7)
Eosinophils Relative: 1 % (ref 0–5)
HCT: 32.4 % — ABNORMAL LOW (ref 36.0–46.0)
HEMOGLOBIN: 10 g/dL — AB (ref 12.0–15.0)
LYMPHS ABS: 2.1 10*3/uL (ref 0.7–4.0)
Lymphocytes Relative: 18 % (ref 12–46)
MCH: 25.3 pg — ABNORMAL LOW (ref 26.0–34.0)
MCHC: 30.9 g/dL (ref 30.0–36.0)
MCV: 82 fL (ref 78.0–100.0)
MONOS PCT: 7 % (ref 3–12)
Monocytes Absolute: 0.8 10*3/uL (ref 0.1–1.0)
NEUTROS ABS: 8.9 10*3/uL — AB (ref 1.7–7.7)
Neutrophils Relative %: 74 % (ref 43–77)
PLATELETS: 424 10*3/uL — AB (ref 150–400)
RBC: 3.95 MIL/uL (ref 3.87–5.11)
RDW: 14.3 % (ref 11.5–15.5)
WBC: 12 10*3/uL — ABNORMAL HIGH (ref 4.0–10.5)

## 2015-01-07 LAB — BASIC METABOLIC PANEL
Anion gap: 11 (ref 5–15)
BUN: 27 mg/dL — AB (ref 6–20)
CHLORIDE: 96 mmol/L — AB (ref 101–111)
CO2: 32 mmol/L (ref 22–32)
CREATININE: 1.01 mg/dL — AB (ref 0.44–1.00)
Calcium: 9.3 mg/dL (ref 8.9–10.3)
GFR calc non Af Amer: >60 mL/min (ref >60)
GLUCOSE: 146 mg/dL — AB (ref 65–99)
Potassium: 3.4 mmol/L — ABNORMAL LOW (ref 3.5–5.1)
SODIUM: 139 mmol/L (ref 135–145)

## 2015-01-07 LAB — MAGNESIUM: Magnesium: 2.2 mg/dL (ref 1.7–2.4)

## 2015-01-07 LAB — GLUCOSE, CAPILLARY
GLUCOSE-CAPILLARY: 298 mg/dL — AB (ref 65–99)
GLUCOSE-CAPILLARY: 245 mg/dL — AB (ref 65–99)
Glucose-Capillary: 159 mg/dL — ABNORMAL HIGH (ref 65–99)
Glucose-Capillary: 148 mg/dL — ABNORMAL HIGH (ref 65–99)
Glucose-Capillary: 154 mg/dL — ABNORMAL HIGH (ref 65–99)
Glucose-Capillary: 191 mg/dL — ABNORMAL HIGH (ref 65–99)

## 2015-01-07 LAB — PHOSPHORUS: Phosphorus: 3.3 mg/dL (ref 2.5–4.6)

## 2015-01-07 MED ORDER — POLYETHYLENE GLYCOL 3350 17 G PO PACK
17.0000 g | PACK | Freq: Every day | ORAL | Status: DC
  Administered 2015-01-07 – 2015-01-09 (×3): 17 g via ORAL
  Filled 2015-01-07 (×4): qty 1

## 2015-01-07 MED ORDER — POTASSIUM CHLORIDE 20 MEQ/15ML (10%) PO SOLN
40.0000 meq | Freq: Once | ORAL | Status: AC
  Administered 2015-01-07: 40 meq
  Filled 2015-01-07 (×2): qty 30

## 2015-01-07 MED ORDER — DOCUSATE SODIUM 50 MG/5ML PO LIQD: 100.0000 mg | Freq: Two times a day (BID) | ORAL | Status: DC

## 2015-01-07 MED ORDER — INSULIN GLARGINE 100 UNIT/ML ~~LOC~~ SOLN
5.0000 [IU] | Freq: Every day | SUBCUTANEOUS | Status: DC
  Administered 2015-01-08 – 2015-01-10 (×3): 5 [IU] via SUBCUTANEOUS
  Filled 2015-01-07 (×3): qty 0.05

## 2015-01-07 MED ORDER — RESOURCE THICKENUP CLEAR PO POWD
ORAL | Status: DC | PRN
  Administered 2015-01-07 (×4): via ORAL
  Filled 2015-01-07 (×2): qty 125

## 2015-01-07 NOTE — Evaluation (Signed)
Clinical/Bedside Swallow Evaluation Patient Details  Name: Felicia Holt MRN: [id number] Date of Birth: 1995-08-01  Today's Date: 7/8/201[name]6 Time: SLP Start Time (ACUTE ONLY): 0948 SLP Stop Time (ACUTE ONLY): 1034 SLP Time Calculation (min) (ACUTE ONLY): 46 min  Past Medical History:  Past Medical History  Diagnosis Date  . Gestational diabetes   . GERD (gastroesophageal reflux disease)   . Morbid obesity    Past Surgical History:  Past Surgical History  Procedure Laterality Date  . Tonsillectomy    . Wisdom tooth extraction    . Adenoidectomy    . Cesarean section N/A 02/03/2014    Procedure: CESAREAN SECTION;  Surgeon: Antionette CharLisa Guadiana-Moore, MD;  Location: [facility];  Service: Obstetrics;  Laterality: N/A;   HPI:  19 y.o. female with a history of Gestational Diabetes, GERD who presents to the ED with complaints of Upper ABD Pain and Left sided Flank Pain x 1 week.Found to have sepsis secondaty to bilateral pyelonephritis, hypotension and tachycardia. Intubated 6/28-7/7(self extubated x 1). Head Ct No acute intracranial pathology. CXR 7/6 interval clearing of airspace opacity bilaterally. Persistent left base atelectasis. Repeat CXR 7/7 improved appearance of the pulmonary interstitium despite persistent mild hypo inflation.   Assessment / Plan / Recommendation Clinical Impression  Pt demonstrates an acute reversible pharyngeal dyshagia likely due to 9 day intubation with suspected edema amd decreased ability to protect trachea. Therapeutic trials of thicker consistencies resulted in mild transient throat clears and coughs. Pt reported increased pharyngeal stasis following honey thick, lessened with nectar. Suspect pt will exhibit intermittent coughing with recommended po's however reflexive cough is strong and suspect she will clear majority if penetrated. Dys 3 texture recommended (family provided with handout of National Dysphagia Dys 3 diet -examples of recommended po's etc). Educated pt  to sit upright, small sips, do no phonate immediately after swallowing, no straws, pills whole in applesauce). ST will continue intervention      Aspiration Risk  Moderate    Diet Recommendation Dysphagia 3 (Mech soft);Nectar   Medication Administration: Whole meds with puree Compensations: Slow rate;Small sips/bites    Other  Recommendations Oral Care Recommendations: Oral care BID Other Recommendations: Order thickener from pharmacy   Follow Up Recommendations       Frequency and Duration min 2x/week  2 weeks   Pertinent Vitals/Pain none         Swallow Study           Oral/Motor/Sensory Function Overall Oral Motor/Sensory Function: Appears within functional limits for tasks assessed   Ice Chips Ice chips: Not tested   Thin Liquid Thin Liquid: Impaired Presentation: Cup;Spoon Oral Phase Impairments:  (none) Pharyngeal  Phase Impairments: Cough - Immediate;Throat Clearing - Delayed    Nectar Thick Nectar Thick Liquid: Impaired Presentation: Cup Pharyngeal Phase Impairments: Throat Clearing - Delayed;Cough - Delayed   Honey Thick Honey Thick Liquid: Impaired Presentation: Cup Pharyngeal Phase Impairments: Cough - Delayed;Throat Clearing - Immediate   Puree Puree: Within functional limits   Solid   GO    Solid: Impaired Oral Phase Functional Implications:  (mild delayed transit)       Taleigha Pinson, Breck CoonsLisa Willis 01/07/2015,10:51 AM  Breck CoonsLisa Willis Lonell FaceLitaker M.Ed [employer] Pager [phone]

## 2015-01-07 NOTE — Progress Notes (Signed)
Nutrition Follow-up  DOCUMENTATION CODES:  Morbid obesity  INTERVENTION:  Needs bowel regimen (no bm x 9 days) - discussed during rounds and with MD  Magic cup BID  NUTRITION DIAGNOSIS:  Inadequate oral intake related to dysphagia as evidenced by other (see comment) (limited intake so far).  ongoing  GOAL:  Patient will meet greater than or equal to 90% of their needs  Not yet met.   MONITOR:  PO intake, Supplement acceptance, Diet advancement, Labs, I & O's  REASON FOR ASSESSMENT:  Consult Enteral/tube feeding initiation and management  ASSESSMENT:  Pt admitted due to flank and abd pain x 1 week, found to have sepsis secondary to suspected pyelonephritis after CT scan. Early 6/28 pt became hypoxemic requiring BiPAP, then eventually intubated with ARDS.  Pt discussed during ICU rounds and with RN.  Pt extubated 7/7.  SLP completed swallow eval and pt has been advanced to Dysphagia 3 diet with Nectar thickened liquids.  Labs reviewed: cbg's: 148-298, potassium low, BUN/Cr elevated Medications reviewed and include: lantus, MVI, lasix    Height:  Ht Readings from Last 1 Encounters:  01/06/15 _0  (1.575 m)    Weight:  Wt Readings from Last 1 Encounters:  01/07/15 230 lb (104.327 kg)    Ideal Body Weight:  50 kg  Wt Readings from Last 10 Encounters:  01/07/15 230 lb (104.327 kg)  04/14/14 229 lb (103.874 kg)  03/25/14 230 lb (104.327 kg)  02/25/14 224 lb (101.606 kg)  02/11/14 249 lb (112.946 kg)  02/05/14 286 lb 11.2 oz (130.046 kg)  01/31/14 282 lb 9.6 oz (128.187 kg)  01/26/14 260 lb (117.935 kg)  01/25/14 260 lb (117.935 kg)  01/21/14 262 lb (118.842 kg)    BMI:  Body mass index is 42.06 kg/(m^2).  Estimated Nutritional Needs:  Kcal:  1900-2100  Protein:  95-115 grams  Fluid:  > 1.9 L/day  Skin:  Reviewed, no issues  Diet Order:  DIET DYS 3 Room service appropriate?: Yes; Fluid consistency:: Nectar Thick  EDUCATION NEEDS:  No  education needs identified at this time   Intake/Output Summary (Last 24 hours) at 01/07/15 1208 Last data filed at 01/07/15 1100  Gross per 24 hour  Intake    890 ml  Output   5200 ml  Net  -4310 ml    Last BM:  PTA  Maylon Peppers RD, [place], [place] Pager [phone] After Hours Pager

## 2015-01-07 NOTE — Progress Notes (Signed)
PULMONARY / CRITICAL CARE MEDICINE   Name: Donna ChristenJanai Zawislak MRN: [id number] DOB: 12-12-1995    ADMISSION DATE:  12/26/2014 CONSULTATION DATE:  12/28/2014  REFERRING MD :  Dr. Mahala MenghiniSamtani  CHIEF COMPLAINT:  SOB  INITIAL PRESENTATION: 19 year old female admitted with flank and abdominal pain x 1 week. She was admitted for sepsis secondary to suspected pyelonephritis after CT scan. She was placed on broad spectrum antibiotics. 6/28 early AM she became hypoxemic requiring BiPAP. CXR revealed new R > L infiltrates. PCCM consulted.   STUDIES:  6/26 CT abd/pelvis: Bilateral pyelonephritis. Both kidneys are diffusely edematous with multi focal areas of abnormal low-density in both kidneys. New hepatomegaly. New edema of the gallbladder wall. No visible stones. The gallbladder is not distended. 6/28 US renal>>>Heterogeneity of the kidneys consistent with the finding seen on recent CT, Gallbladder wall edema without definitive cholelithiasis. 6/28 dopplers>>>neg lowers 6/28 echo>>>65%, PA 52 7/7 ct head>>>neg  SIGNIFICANT EVENTS: 6/28 ett placed, ALI, shock, MODS 6/30 self extubated requiring immediate re-intubation 7/01 Tolerated PS 5-10 cm H2O. Too somnolent to extubate. Dexmedetomidine initiated 01/03/15" Neg 5 liters, to peep 8  01/04/15: Rn reports not on pressors. Very agitated even with slightest sedation wean. Needing scheduled klonopin + dirpivan gtt + fent gtt 7/7- extubated, fall in evening    SUBJECTIVE/OVERNIGHT/INTERVAL HX In bed, no distress VITAL SIGNS: Temp:  [98.4 F (36.9 C)-99.2 F (37.3 C)] 98.8 F (37.1 C) (07/08 0741) Pulse Rate:  [83-121] 90 (07/08 1100) Resp:  [11-23] 19 (07/08 1100) BP: (101-156)/(54-106) 156/87 mmHg (07/08 1100) SpO2:  [91 %-100 %] 94 % (07/08 1100) Weight:  [104.327 kg (230 lb)] 104.327 kg (230 lb) (07/08 0500) HEMODYNAMICS:   VENTILATOR SETTINGS:   INTAKE / OUTPUT:  Intake/Output Summary (Last 24 hours) at 01/07/15 1207 Last data filed at  01/07/15 1100  Gross per 24 hour  Intake    890 ml  Output   5200 ml  Net  -4310 ml    PHYSICAL EXAMINATION: General: NAD. Obese Neuro:  No distress, nonfocal, less confusion, resolved HEENT ett, perrL Cardiovascular:  s1 s2 Reg, no M Lungs: clear Abdomen: Soft, +BS Ext: no edema, warm  LABS:  PULMONARY  Recent Labs Lab 01/01/15 1047 01/01/15 1229 01/02/15 1101  PHART 7.407 7.351 7.380  PCO2ART 42.2 49.7* 47.2*  PO2ART 43.0* 75.0* 67.0*  HCO3 26.4* 27.4* 28.0*  TCO2 28 29 29   O2SAT 78.0 94.0 93.0    CBC  Recent Labs Lab 01/05/15 0400 01/06/15 0350 01/07/15 0500  HGB 8.7* 9.2* 10.0*  HCT 28.1* 29.7* 32.4*  WBC 12.8* 9.9 12.0*  PLT 268 300 424*    COAGULATION No results for input(s): INR in the last 168 hours.  CARDIAC  No results for input(s): TROPONINI in the last 168 hours. No results for input(s): PROBNP in the last 168 hours.   CHEMISTRY  Recent Labs Lab 01/03/15 0340 01/04/15 0440 01/05/15 0400 01/06/15 0350 01/07/15 0500  NA 144 143 146* 144 139  K 3.5 3.6 4.0 4.1 3.4*  CL 102 101 101 101 96*  CO2 33* 36* 35* 35* 32  GLUCOSE 229* 263* 174* 181* 146*  BUN 19 21* 30* 35* 27*  CREATININE 1.17* 1.10* 1.06* 0.99 1.01*  CALCIUM 7.9* 8.1* 8.6* 8.9 9.3  MG 1.8 2.2 2.2 2.4 2.2  PHOS 4.4  --  4.5 5.2* 3.3   Estimated Creatinine Clearance: 99.9 mL/min (by C-G formula based on Cr of 1.01).   LIVER  Recent Labs Lab 01/04/15 0440  AST 30  ALT 28  ALKPHOS 73  BILITOT 0.4  PROT 6.1*  ALBUMIN 1.6*     INFECTIOUS  Recent Labs Lab 01/01/15 0340 01/05/15  LATICACIDVEN  --  0.6  PROCALCITON 0.46  --      ENDOCRINE CBG (last 3)   Recent Labs  01/07/15 0500 01/07/15 0739 01/07/15 1159  GLUCAP 148* 154* 298*    IMAGING x48h  - image(s) personally visualized  -   highlighted in bold Ct Head Wo Contrast  01/06/2015   CLINICAL DATA:  19 year old female status post fall.  EXAM: CT HEAD WITHOUT CONTRAST  TECHNIQUE: Contiguous  axial images were obtained from the base of the skull through the vertex without intravenous contrast.  COMPARISON:  None.  FINDINGS: The ventricles and the sulci are appropriate in size for the patient's age. There is no intracranial hemorrhage. No midline shift or mass effect identified. The gray-white matter differentiation is preserved.  The visualized paranasal sinuses and mastoid air cells are well aerated. The calvarium is intact.  IMPRESSION: No acute intracranial pathology.   Electronically Signed   By: Elgie Collard M.D.   On: 01/06/2015 21:00   Dg Chest Port 1 View  01/06/2015   CLINICAL DATA:  Follow-up of respiratory failure, ARDS  EXAM: PORTABLE CHEST - 1 VIEW  COMPARISON:  Portable chest x-ray of January 05, 2015  FINDINGS: The lungs or remain borderline hypoinflated. The interstitial markings are slightly less conspicuous today. There is no alveolar infiltrate. The cardiac silhouette is mildly enlarged though stable. The pulmonary vascularity is not engorged.  The endotracheal tube tip lies approximately 3 cm above the carina. The esophagogastric tube tip projects at the level of the GE junction but the proximal port is above the GE junction. The right internal jugular venous catheter tip projects over the midportion of the SVC.  IMPRESSION: 1. Improved appearance of the pulmonary interstitium despite persistent mild hypo inflation. 2. The endotracheal tube is in reasonable position. The esophagogastric tube should be advanced 10-20 cm to assure that the proximal port lies below the GE junction.   Electronically Signed   By: David  [place] M.D.   On: 01/06/2015 07:38      ASSESSMENT / PLAN:  PULMONARY A: Acute hypoxic respiratory failure ARDS, radiographically resolving Failed self extubation 6/30 pulm edema better  P:   Extubated successful day 10 IS Slow diuresis  CARDIOVASCULAR A:  Overload P:  Tele to continue  RENAL A:   AKI, nonoliguric - resolved  P:   Was again  neg 4.5 liters, pcxr cleared day prior Dc lasix Chem in am  k supp  GASTROINTESTINAL A:   Abdominal pain, resolved constipation P:   SUP: enteral famotidine- dc Colace mirilax  HEMATOLOGIC A:   Anemia without overt bleeding, dilution P:  DVT px: SQ heparin until full ambulation  INFECTIOUS A:   Severe sepsis  Pyelonephritis, no organism identified P:   Urine 6/29 >> NEG Resp 6/28 >> NEG Blood 6/27 >> NEG  Vanc 6/26 >> 6/30 Pip/tazo 6/26 >> 6/30 Ceftaz 6/30 >> 7/2 ceftriaxone 7/2>>>alloed to dc after 10 days  ENDOCRINE A:   Hyperglycemia, better controlled P:   Cont SSI Less intake, no further TF, lantus frmo 20 to 5, likley to dc further Get hgb a1c  NEUROLOGIC A:   Agitation Acute encephalopathy - gets very agitated  Delirium improved Fall risk P:   Dc benzo, had reduce Ct head neg To sdu Family in room, may need sitter  To triad, sdu   Mcarthur Rossetti. Tyson Alias, MD, FACP Pgr: [phone] [facility]

## 2015-01-08 DIAGNOSIS — G9340 Encephalopathy, unspecified: Secondary | ICD-10-CM | POA: Diagnosis present

## 2015-01-08 DIAGNOSIS — J9601 Acute respiratory failure with hypoxia: Secondary | ICD-10-CM | POA: Diagnosis present

## 2015-01-08 DIAGNOSIS — I270 Primary pulmonary hypertension: Secondary | ICD-10-CM

## 2015-01-08 DIAGNOSIS — N179 Acute kidney failure, unspecified: Secondary | ICD-10-CM | POA: Diagnosis present

## 2015-01-08 DIAGNOSIS — E119 Type 2 diabetes mellitus without complications: Secondary | ICD-10-CM

## 2015-01-08 DIAGNOSIS — N10 Acute tubulo-interstitial nephritis: Secondary | ICD-10-CM

## 2015-01-08 DIAGNOSIS — I272 Pulmonary hypertension, unspecified: Secondary | ICD-10-CM | POA: Diagnosis present

## 2015-01-08 DIAGNOSIS — J810 Acute pulmonary edema: Secondary | ICD-10-CM

## 2015-01-08 LAB — BASIC METABOLIC PANEL
ANION GAP: 11 (ref 5–15)
BUN: 24 mg/dL — ABNORMAL HIGH (ref 6–20)
CHLORIDE: 94 mmol/L — AB (ref 101–111)
CO2: 29 mmol/L (ref 22–32)
CREATININE: 1.05 mg/dL — AB (ref 0.44–1.00)
Calcium: 8.9 mg/dL (ref 8.9–10.3)
GFR calc non Af Amer: >60 mL/min (ref >60)
Glucose, Bld: 229 mg/dL — ABNORMAL HIGH (ref 65–99)
POTASSIUM: 4.1 mmol/L (ref 3.5–5.1)
Sodium: 134 mmol/L — ABNORMAL LOW (ref 135–145)

## 2015-01-08 LAB — CBC WITH DIFFERENTIAL/PLATELET
BASOS ABS: 0 10*3/uL (ref 0.0–0.1)
BASOS PCT: 0 % (ref 0–1)
EOS PCT: 1 % (ref 0–5)
Eosinophils Absolute: 0.1 10*3/uL (ref 0.0–0.7)
HCT: 34.8 % — ABNORMAL LOW (ref 36.0–46.0)
HEMOGLOBIN: 10.9 g/dL — AB (ref 12.0–15.0)
LYMPHS PCT: 24 % (ref 12–46)
Lymphs Abs: 2.6 10*3/uL (ref 0.7–4.0)
MCH: 25.6 pg — AB (ref 26.0–34.0)
MCHC: 31.3 g/dL (ref 30.0–36.0)
MCV: 81.9 fL (ref 78.0–100.0)
MONO ABS: 0.9 10*3/uL (ref 0.1–1.0)
Monocytes Relative: 9 % (ref 3–12)
Neutro Abs: 7 10*3/uL (ref 1.7–7.7)
Neutrophils Relative %: 66 % (ref 43–77)
PLATELETS: 488 10*3/uL — AB (ref 150–400)
RBC: 4.25 MIL/uL (ref 3.87–5.11)
RDW: 14.4 % (ref 11.5–15.5)
WBC: 10.7 10*3/uL — ABNORMAL HIGH (ref 4.0–10.5)

## 2015-01-08 LAB — GLUCOSE, CAPILLARY
GLUCOSE-CAPILLARY: 246 mg/dL — AB (ref 65–99)
GLUCOSE-CAPILLARY: 214 mg/dL — AB (ref 65–99)
Glucose-Capillary: 187 mg/dL — ABNORMAL HIGH (ref 65–99)
Glucose-Capillary: 206 mg/dL — ABNORMAL HIGH (ref 65–99)
Glucose-Capillary: 208 mg/dL — ABNORMAL HIGH (ref 65–99)
Glucose-Capillary: 232 mg/dL — ABNORMAL HIGH (ref 65–99)

## 2015-01-08 LAB — HEMOGLOBIN A1C
HEMOGLOBIN A1C: 6.9 % — AB (ref 4.8–5.6)
Mean Plasma Glucose: 151 mg/dL

## 2015-01-08 LAB — MAGNESIUM: Magnesium: 2.2 mg/dL (ref 1.7–2.4)

## 2015-01-08 LAB — PHOSPHORUS: Phosphorus: 3.2 mg/dL (ref 2.5–4.6)

## 2015-01-08 MED ORDER — METFORMIN HCL 500 MG PO TABS
500.0000 mg | ORAL_TABLET | Freq: Two times a day (BID) | ORAL | Status: DC
  Administered 2015-01-08 – 2015-01-09 (×2): 500 mg via ORAL
  Filled 2015-01-08 (×4): qty 1

## 2015-01-08 NOTE — Progress Notes (Signed)
[place] TEAM 1 - Stepdown/ICU TEAM Progress Note  Felicia Holt ZOX:[id number] DOB: June 26, 1996 DOA: 12/26/2014 PCP: Pcp Not In System  Admit HPI / Brief Narrative: 19 year old BF PMHx gestational diabetes, morbid obesity   Admitted with flank and abdominal pain x 1 week. She was admitted for sepsis secondary to suspected pyelonephritis after CT scan. She was placed on broad spectrum antibiotics. 6/28 early AM she became hypoxemic requiring BiPAP. CXR revealed new R > L infiltrates. PCCM consulted.   HPI/Subjective: 7/9 A/O 3 (does not know why), negative CP, negative SOB, negative N/V, negative abdominal pain    Assessment/Plan: Acute respiratory failure with hypoxia /ARDS -Failed self extubation 6/30 -Currently on room air in no distress.   Pulmonary edema  -Obtain PCXR in a.m.  Pulmonary hypertension -In the future patient's BP will tolerate may benefit from afterload reducing agent such as Imdur. Currently would not start this agent.  Severe sepsis/Pyelonephritis, no organism identified -Patient has completed a 10 day course of multiple antibiotics for her pyelonephritis. Although has mild leukocytosis, negative left shift, negative bands, negative fever will hold on restarting any antibiotics at this time.   AKI, nonoliguric - resolved -Hypervolemic  -Strict intake/uptake; -7.4 L  Anemia without overt bleeding, dilution -Stable will continue to monitor closely  Diabetes type 2 controlled -Counseled to diabetic coordinator  -Start metformin 500 mg BID -Continue moderate SSI -Continue dysphagia 3 car modified diet -7/8 hemoglobin A1c= 6.9  Acute encephalopathy  -Resolved -PT/OT consult    Code Status: FULL Family Communication: no family present at time of exam Disposition Plan: SNF vs home health    Consultants: Dr.Daniel Daneil Dan [facility] M)    Procedure/Significant Events: 6/26 CT abd/pelvis: Bilateral pyelonephritis. Both kidneys are diffusely  edematous with multi focal areas of abnormal low-density in both kidneys. New hepatomegaly. New edema of the gallbladder wall. No visible stones. The gallbladder is not distended. 6/28 US renal>>>Heterogeneity of the kidneys consistent with the finding seen on recent CT, Gallbladder wall edema without definitive cholelithiasis. 6/28 dopplers>>>neg lowers 6/28 echocardiogram>>>LVEF65%, PA peak pressure 52 7/7 ct head>>>neg    Culture Urine 6/29 >> NEG Resp 6/28 >> NEG Blood 6/27 >> NEG   Antibiotics: Vanc 6/26 >> 6/30 Pip/tazo 6/26 >> 6/30 Ceftaz 6/30 >> 7/2 Ceftriaxone 7/2>>> stopped 7/3   DVT prophylaxis: Subcutaneous heparin   Devices   LINES / TUBES:      Continuous Infusions: . sodium chloride 10 mL/hr (01/07/15 1526)    Objective: VITAL SIGNS: Temp: 98.5 F (36.9 C) (07/09 1648) Temp Source: Oral (07/09 1648) BP: 115/66 mmHg (07/09 1138) Pulse Rate: 87 (07/09 1648) SPO2; FIO2:   Intake/Output Summary (Last 24 hours) at 01/08/15 1959 Last data filed at 01/08/15 1400  Gross per 24 hour  Intake    839 ml  Output   1540 ml  Net   -701 ml     Exam: General:A/O 3 (does not know why), No acute respiratory distress Eyes: Negative headache, eye pain, double vision, positive bilateral sclera hemorrhage ENT: Negative Runny nose, negative ear pain, negative tinnitus, negative gingival bleeding,  Neck:  Negative scars, masses, torticollis, lymphadenopathy, JVD Lungs: Clear to auscultation bilaterally without wheezes or crackles Cardiovascular: Regular rate and rhythm without murmur gallop or rub normal S1 and S2 Abdomen:negative abdominal pain, negative dysphagia, Nontender, nondistended, soft, bowel sounds positive, no rebound, no ascites, no appreciable mass Extremities: No significant cyanosis, clubbing, or edema bilateral lower extremities Psychiatric:  Negative depression, negative anxiety, negative fatigue, negative  mania  Neurologic:  Cranial nerves  II through XII intact, tongue/uvula midline, all extremities muscle strength 5/5, sensation intact throughout, negative dysarthria, negative expressive aphasia, negative receptive aphasia.      Data Reviewed: Basic Metabolic Panel:  Recent Labs Lab 01/03/15 0340 01/04/15 0440 01/05/15 0400 01/06/15 0350 01/07/15 0500 01/08/15 0235  NA 144 143 146* 144 139 134*  K 3.5 3.6 4.0 4.1 3.4* 4.1  CL 102 101 101 101 96* 94*  CO2 33* 36* 35* 35* 32 29  GLUCOSE 229* 263* 174* 181* 146* 229*  BUN 19 21* 30* 35* 27* 24*  CREATININE 1.17* 1.10* 1.06* 0.99 1.01* 1.05*  CALCIUM 7.9* 8.1* 8.6* 8.9 9.3 8.9  MG 1.8 2.2 2.2 2.4 2.2 2.2  PHOS 4.4  --  4.5 5.2* 3.3 3.2   Liver Function Tests:  Recent Labs Lab 01/04/15 0440  AST 30  ALT 28  ALKPHOS 73  BILITOT 0.4  PROT 6.1*  ALBUMIN 1.6*   No results for input(s): LIPASE, AMYLASE in the last 168 hours. No results for input(s): AMMONIA in the last 168 hours. CBC:  Recent Labs Lab 01/04/15 0440 01/05/15 0400 01/06/15 0350 01/07/15 0500 01/08/15 0235  WBC 12.3* 12.8* 9.9 12.0* 10.7*  NEUTROABS 8.8* 8.6* 6.5 8.9* 7.0  HGB 7.8* 8.7* 9.2* 10.0* 10.9*  HCT 24.9* 28.1* 29.7* 32.4* 34.8*  MCV 82.5 83.9 85.8 82.0 81.9  PLT 213 268 300 424* 488*   Cardiac Enzymes:  Recent Labs Lab 01/06/15 0350  CKTOTAL 761*  CKMB 1.4   BNP (last 3 results)  Recent Labs  01/04/15 0440  BNP 17.9    ProBNP (last 3 results) No results for input(s): PROBNP in the last 8760 hours.  CBG:  Recent Labs Lab 01/08/15 01/08/15 0514 01/08/15 0735 01/08/15 1214 01/08/15 1644  GLUCAP 206* 232* 187* 246* 208*    No results found for this or any previous visit (from the past 240 hour(s)).   Studies:  Recent x-ray studies have been reviewed in detail by the Attending Physician  Scheduled Meds:  Scheduled Meds: . heparin subcutaneous  5,000 Units Subcutaneous 3 times per day  . insulin aspart  0-15 Units Subcutaneous 6 times per day  .  insulin glargine  5 Units Subcutaneous Daily  . metFORMIN  500 mg Oral BID WC  . multivitamin with minerals  1 tablet Oral Daily  . polyethylene glycol  17 g Oral Daily    Time spent on care of this patient: 40 mins   Felicia Holt, Felicia Holt , Felicia Holt  [facility] Office  [phone] Pager [phone]  On-Call/Text Page:      Felicia Holt      password [email or web address]  If 7PM-7AM, please contact night-coverage [email or web address] Password [email or web address] 01/08/2015, 7:59 PM   LOS: 13 days   Care during the described time interval was provided by me .  I have reviewed this patient's available data, including medical history, events of note, physical examination, and all test results as part of my evaluation. I have personally reviewed and interpreted all radiology studies.   Felicia Littlesurtis Tyra Michelle, Felicia Holt [phone] Pager

## 2015-01-08 NOTE — Progress Notes (Signed)
Patient transferred to 2C16 from 2H12. Mother at beside.

## 2015-01-09 LAB — CBC WITH DIFFERENTIAL/PLATELET
BASOS ABS: 0 10*3/uL (ref 0.0–0.1)
Basophils Relative: 0 % (ref 0–1)
EOS ABS: 0.1 10*3/uL (ref 0.0–0.7)
EOS PCT: 1 % (ref 0–5)
HCT: 32.9 % — ABNORMAL LOW (ref 36.0–46.0)
Hemoglobin: 10.5 g/dL — ABNORMAL LOW (ref 12.0–15.0)
Lymphocytes Relative: 34 % (ref 12–46)
Lymphs Abs: 2.9 10*3/uL (ref 0.7–4.0)
MCH: 25.5 pg — AB (ref 26.0–34.0)
MCHC: 31.9 g/dL (ref 30.0–36.0)
MCV: 79.9 fL (ref 78.0–100.0)
Monocytes Absolute: 0.8 10*3/uL (ref 0.1–1.0)
Monocytes Relative: 10 % (ref 3–12)
Neutro Abs: 4.7 10*3/uL (ref 1.7–7.7)
Neutrophils Relative %: 55 % (ref 43–77)
PLATELETS: 490 10*3/uL — AB (ref 150–400)
RBC: 4.12 MIL/uL (ref 3.87–5.11)
RDW: 14.2 % (ref 11.5–15.5)
WBC: 8.6 10*3/uL (ref 4.0–10.5)

## 2015-01-09 LAB — GLUCOSE, CAPILLARY
GLUCOSE-CAPILLARY: 146 mg/dL — AB (ref 65–99)
GLUCOSE-CAPILLARY: 171 mg/dL — AB (ref 65–99)
GLUCOSE-CAPILLARY: 189 mg/dL — AB (ref 65–99)
Glucose-Capillary: 140 mg/dL — ABNORMAL HIGH (ref 65–99)
Glucose-Capillary: 184 mg/dL — ABNORMAL HIGH (ref 65–99)
Glucose-Capillary: 116 mg/dL — ABNORMAL HIGH (ref 65–99)

## 2015-01-09 LAB — COMPREHENSIVE METABOLIC PANEL
ALT: 40 U/L (ref 14–54)
ANION GAP: 10 (ref 5–15)
AST: 27 U/L (ref 15–41)
Albumin: 2.7 g/dL — ABNORMAL LOW (ref 3.5–5.0)
Alkaline Phosphatase: 75 U/L (ref 38–126)
BUN: 18 mg/dL (ref 6–20)
CO2: 27 mmol/L (ref 22–32)
Calcium: 9.1 mg/dL (ref 8.9–10.3)
Chloride: 98 mmol/L — ABNORMAL LOW (ref 101–111)
Creatinine, Ser: 1.01 mg/dL — ABNORMAL HIGH (ref 0.44–1.00)
GFR calc Af Amer: >60 mL/min (ref >60)
GFR calc non Af Amer: >60 mL/min (ref >60)
GLUCOSE: 159 mg/dL — AB (ref 65–99)
POTASSIUM: 3.7 mmol/L (ref 3.5–5.1)
SODIUM: 135 mmol/L (ref 135–145)
TOTAL PROTEIN: 7.9 g/dL (ref 6.5–8.1)
Total Bilirubin: 0.6 mg/dL (ref 0.3–1.2)

## 2015-01-09 LAB — LIPID PANEL
CHOL/HDL RATIO: 9.2 ratio
CHOLESTEROL: 303 mg/dL — AB (ref 0–200)
HDL: 33 mg/dL — ABNORMAL LOW (ref >40)
LDL Cholesterol: 233 mg/dL — ABNORMAL HIGH (ref 0–99)
Triglycerides: 186 mg/dL — ABNORMAL HIGH (ref <150)
VLDL: 37 mg/dL (ref 0–40)

## 2015-01-09 MED ORDER — ATORVASTATIN CALCIUM 20 MG PO TABS
20.0000 mg | ORAL_TABLET | Freq: Every day | ORAL | Status: DC
  Administered 2015-01-09: 20 mg via ORAL
  Filled 2015-01-09 (×2): qty 1

## 2015-01-09 MED ORDER — METFORMIN HCL 850 MG PO TABS
850.0000 mg | ORAL_TABLET | Freq: Two times a day (BID) | ORAL | Status: DC
  Administered 2015-01-10: 850 mg via ORAL
  Filled 2015-01-09 (×4): qty 1

## 2015-01-09 MED ORDER — GLIPIZIDE 2.5 MG HALF TABLET
2.5000 mg | ORAL_TABLET | Freq: Every day | ORAL | Status: DC
  Administered 2015-01-09 – 2015-01-10 (×2): 2.5 mg via ORAL
  Filled 2015-01-09 (×4): qty 1

## 2015-01-09 NOTE — Progress Notes (Addendum)
Arrival Method: via wheelchair Mental Status: alert and oriented x 4 Telemetry: n/a Skin: assessment documented Tubes: n/a IV: n/a (MD aware) Pain: 0/10 Family: mom at bedside Living Situation: home with mom  Safety Measures: call bell in reach, bed in lowest position, pt instructed to call for help ambulating 6E Orientation: oriented to staff and unit  Pt states she has been drinking thin liquids, however order states nectar thick. Dr. Joseph ArtWoods notified and nectar thick order stands. Pt educated, however, now refusing all liquids. Will continue to monitor.

## 2015-01-09 NOTE — Evaluation (Signed)
Occupational Therapy Evaluation Patient Details Name: Felicia Holt MRN: [id number] DOB: 25-Jun-1996 Today's Date: 01/09/2015    History of Present Illness Pt is a 19 y/o female presenting with flank and abdominal pain x1 week. She was admitted with sepsis 2 to suspected pyelonephritis after CT scan and was placed on broad spectrum antibiotics. 6/28 in early AM she became hypoxemic requiring BiPAP and eventual intubation. Pt was extubated on 7/7 and had an unwitnessed fall later that evening.   Clinical Impression   Pt admitted with above.  Currently, she requires supervision for ADLs, and demonstrates slow processing speed.  She has an 47 mos old son and worked full time.  At discharge, she will reside with mother.   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  No further acute OT needs identified.     Follow Up Recommendations  Outpatient OT;Supervision/Assistance - 24 hour    Equipment Recommendations  None recommended by OT    Recommendations for Other Services       Precautions / Restrictions Precautions Precautions: Fall Precaution Comments: Pt has had falls while inpatient Restrictions Weight Bearing Restrictions: No      Mobility Bed Mobility Overal bed mobility: Modified Independent                Transfers Overall transfer level: Needs assistance   Transfers: Sit to/from Stand;Stand Pivot Transfers Sit to Stand: Supervision Stand pivot transfers: Supervision            Balance Overall balance assessment: Needs assistance Sitting-balance support: Feet supported Sitting balance-Leahy Scale: Good     Standing balance support: During functional activity Standing balance-Leahy Scale: Fair Standing balance comment: Pt able to retrieve item from floor with no LOB, but when attempting to simulate LB bathing, and single leg stance, pt with LOB requiring assist to recover                             ADL Overall ADL's  : Needs assistance/impaired Eating/Feeding: Independent   Grooming: Wash/dry hands;Wash/dry face;Oral care;Brushing hair;Supervision/safety;Standing   Upper Body Bathing: Standing;Supervision/ safety   Lower Body Bathing: Min guard;Sit to/from stand Lower Body Bathing Details (indicate cue type and reason): Pt with LOB while simulating shower in standing.  Instructed pt to stablize self with UEs before attempting to bathe LEs Upper Body Dressing : Set up;Sitting   Lower Body Dressing: Supervision/safety;Sit to/from stand   Toilet Transfer: Supervision/safety;Ambulation;Comfort height toilet   Toileting- Clothing Manipulation and Hygiene: Supervision/safety;Sit to/from stand   Tub/ Shower Transfer: Tub transfer;Supervision/safety;Ambulation   Functional mobility during ADLs: Supervision/safety;Min guard General ADL Comments: Spoke with pt and mother re: safe progression of return to independence.  Pt will live with mother who will assist pt with son.  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  Once she can do that without difficulty, then she can consider moving into her own apt.  Also discussed carrying a weighted back pack to progress strength for carrying son, but to perform this with supervision initially      Vision Vision Assessment?: Yes Eye Alignment: Within Functional Limits Ocular Range of Motion: Within Functional Limits Tracking/Visual Pursuits: Able to track stimulus in all quads without difficulty Visual Fields: No apparent deficits   Perception Perception Perception Tested?: Yes   Praxis Praxis Praxis tested?: Within functional limits    Pertinent Vitals/Pain Pain Assessment: No/denies pain     Hand  Dominance Right   Extremity/Trunk Assessment Upper Extremity Assessment Upper Extremity Assessment: Generalized weakness   Lower Extremity Assessment Lower Extremity Assessment: Defer to PT evaluation   Cervical / Trunk  Assessment Cervical / Trunk Assessment: Normal   Communication Communication Communication: No difficulties   Cognition Arousal/Alertness: Awake/alert Behavior During Therapy: WFL for tasks assessed/performed Overall Cognitive Status: Impaired/Different from baseline Area of Impairment: Problem solving             Problem Solving: Slow processing General Comments: Pt slow to process information.  Mom confims   General Comments       Exercises       Shoulder Instructions      Home Living Family/patient expects to be discharged to:: Private residence Living Arrangements: Parent Available Help at Discharge: Family;Available 24 hours/day Type of Home: House Home Access: Stairs to enter Entergy CorporationEntrance Stairs-Number of Steps: 2 Entrance Stairs-Rails: Right;Left Home Layout: One level     Bathroom Shower/Tub: Tub/shower unit;Curtain Shower/tub characteristics: Engineer, building servicesCurtain Bathroom Toilet: Standard     Home Equipment: None   Additional Comments: Pt lived alone PTA, but family has moved her from her apt and pt will reside with mom at discharge.       Prior Functioning/Environment Level of Independence: Independent        Comments: Pt has 4911 mos old son, works full time at call center and drives     OT Diagnosis: Generalized weakness;Cognitive deficits   OT Problem List: Decreased strength;Decreased activity tolerance;Impaired balance (sitting and/or standing);Decreased cognition   OT Treatment/Interventions:      OT Goals(Current goals can be found in the care plan section) Acute Rehab OT Goals Patient Stated Goal: to return to independence  OT Goal Formulation: All assessment and education complete, DC therapy  OT Frequency:     Barriers to D/C:            Co-evaluation              End of Session Nurse Communication: Mobility status  Activity Tolerance: Patient tolerated treatment well Patient left: in chair;with call bell/phone within reach;with  family/visitor present   Time: 1351-1415 OT Time Calculation (min): 24 min Charges:  OT General Charges $OT Visit: 1 Procedure OT Evaluation $Initial OT Evaluation Tier I: 1 Procedure OT Treatments $Self Care/Home Management : 8-22 mins G-Codes:    Felicia Holt M 01/09/2015, 2:30 PM

## 2015-01-09 NOTE — Progress Notes (Signed)
Pts battery on teley box died. No replacements on floor. Dinmap was hooked up to pt. RN will monitor pt and vitals through dinmap until teley box battery can be replaced.

## 2015-01-09 NOTE — Evaluation (Signed)
Physical Therapy Evaluation Patient Details Name: Felicia Holt MRN: [id number] DOB: [birth year] Today's Date: 01/09/2015   History of Present Illness  Pt is a 19 y/o female presenting with flank and abdominal pain x1 week. She was admitted with sepsis 2 to suspected pyelonephritis after CT scan and was placed on broad spectrum antibiotics. 6/28 in early AM she became hypoxemic requiring BiPAP and eventual intubation. Pt was extubated on 7/7 and had an unwitnessed fall later that evening.  Clinical Impression  Pt admitted with above diagnosis. Pt currently with functional limitations due to the deficits listed below (see PT Problem List). At the time of PT eval pt was able to perform transfers and ambulation with min assist for occasional balance disturbances. It appears that pt is making functional progress and pt states she feels "back to normal". Recommending OPPT and a SPC for safety with mobility until balance is back to baseline. Pt will benefit from skilled PT to increase their independence and safety with mobility to allow discharge to the venue listed below.       Follow Up Recommendations Outpatient PT;Supervision for mobility/OOB    Equipment Recommendations  Cane    Recommendations for Other Services       Precautions / Restrictions Precautions Precautions: Fall Precaution Comments: Pt has had falls while inpatient Restrictions Weight Bearing Restrictions: No      Mobility  Bed Mobility Overal bed mobility: Modified Independent             General bed mobility comments: Pt was able to transition to EOB without assistance. Cueing to complete transfer and scoot all the way out so feet were resting on the floor.   Transfers Overall transfer level: Needs assistance Equipment used: Rolling walker (2 wheeled) Transfers: Sit to/from Stand Sit to Stand: Supervision         General transfer comment: Pt was able to power-up to full standing with supervision for  safety. Pt appears slightly unsteady, however no physical assist required to recover.   Ambulation/Gait Ambulation/Gait assistance: Min assist Ambulation Distance (Feet): 225 Feet Assistive device: Rolling walker (2 wheeled);None Gait Pattern/deviations: Step-through pattern;Decreased stride length;Wide base of support Gait velocity: Decreased Gait velocity interpretation: Below normal speed for age/gender General Gait Details: Pt initially ambulated ~100 feet with RW. Gait pattern was slow and guarded but was overall balanced. Next 125' were without AD, and pt was ambulating at a slightly quicker pace (with cueing) and appeared more comfortable. 1 LOB to the L during a turn in which heavy min assist was provided to recover.   Stairs            Wheelchair Mobility    Modified Rankin (Stroke Patients Only)       Balance Overall balance assessment: Needs assistance Sitting-balance support: Feet supported;No upper extremity supported Sitting balance-Leahy Scale: Good     Standing balance support: No upper extremity supported Standing balance-Leahy Scale: Fair               High level balance activites: Side stepping;Direction changes;Turns High Level Balance Comments: Pt also was able to step over objects, ambulate with large steps, and very her speed with cueing. 1 LOB with turns.              Pertinent Vitals/Pain Pain Assessment: No/denies pain    Home Living Family/patient expects to be discharged to:: Private residence Living Arrangements: Parent Available Help at Discharge: Family;Available 24 hours/day Type of Home: House Home Access: Stairs to enter Entrance Stairs-Rails:  Right;Left Entrance Stairs-Number of Steps: 2 Home Layout: One level Home Equipment: None      Prior Function Level of Independence: Independent         Comments: Driving, currently working but has a leave of absence due to medical issues     Hand Dominance   Dominant  Hand: Right    Extremity/Trunk Assessment   Upper Extremity Assessment: Defer to OT evaluation           Lower Extremity Assessment: Generalized weakness (Strength decreased compared to her normal however still WFL)      Cervical / Trunk Assessment: Normal  Communication   Communication: No difficulties  Cognition Arousal/Alertness: Awake/alert Behavior During Therapy: WFL for tasks assessed/performed Overall Cognitive Status: Within Functional Limits for tasks assessed                      General Comments      Exercises        Assessment/Plan    PT Assessment Patient needs continued PT services  PT Diagnosis Difficulty walking;Generalized weakness   PT Problem List Decreased strength;Decreased range of motion;Decreased activity tolerance;Decreased balance;Decreased mobility;Decreased knowledge of use of DME;Decreased safety awareness;Decreased knowledge of precautions  PT Treatment Interventions DME instruction;Gait training;Stair training;Functional mobility training;Therapeutic activities;Therapeutic exercise;Neuromuscular re-education;Patient/family education   PT Goals (Current goals can be found in the Care Plan section) Acute Rehab PT Goals Patient Stated Goal: Return home and back to work as soon as possible.  PT Goal Formulation: With patient Time For Goal Achievement: 01/16/15 Potential to Achieve Goals: Good    Frequency Min 3X/week   Barriers to discharge        Co-evaluation               End of Session Equipment Utilized During Treatment: Gait belt Activity Tolerance: Patient tolerated treatment well Patient left: in chair;with call bell/phone within reach;with chair alarm set Nurse Communication: Mobility status         Time: [id number] PT Time Calculation (min) (ACUTE ONLY): 24 min   Charges:   PT Evaluation $Initial PT Evaluation Tier I: 1 Procedure PT Treatments $Gait Training: 8-22 mins   PT G Codes:         Conni SlipperKirkman, Denee Boeder 01/09/2015, 9:59 AM   Conni SlipperLaura Jamesyn [place], PT, DPT Acute Rehabilitation Services Pager: [phone]

## 2015-01-09 NOTE — Progress Notes (Signed)
Earlier in the evening, bed alarm alerted staff that patient was attempting to get out of bed.  Upon entering the room, the patient was found on the side of the bed.  She is wanting to go to the bathroom.  She is very irate about bed alarm and is refusing for it to be reactivated.  Explained that she had fallen earlier and that the purpose was to remind her to ask for assistance before getting out of bed.  She stated she did not need assistance and continued to refused.  Gait is steady at this time.  Later in the evening, she did call for assistance to the bathroom.  Bed alarm continues to be off per patient's request.  She is in camera room.  Will continue to monitor patient.  [place] [employer] MinorKimberly Burnette Valenti RN-BC, WTA.

## 2015-01-09 NOTE — Progress Notes (Signed)
Pt transferred to 6E-16 via wheelchair, accompanied by RN and grandmother.  Report given to [place], RN, at the bedside.  All questions answered.  Pt's smart phone and charger remained in her possession and accompanied her to 6E-16.  Family members took other possessions out of the room.  Upon staff returning to 2E, room 16 was checked to verify that no pt belongings were left behind.  None were found.

## 2015-01-09 NOTE — Progress Notes (Signed)
[place] TEAM 1 - Stepdown/ICU TEAM Progress Note  Donna ChristenJanai Owczarzak BJY:[id number] DOB: [birth year] DOA: 12/26/2014 PCP: Pcp Not In System  Admit HPI / Brief Narrative: 19 year old BF PMHx gestational diabetes, morbid obesity   Admitted with flank and abdominal pain x 1 week. She was admitted for sepsis secondary to suspected pyelonephritis after CT scan. She was placed on broad spectrum antibiotics. 6/28 early AM she became hypoxemic requiring BiPAP. CXR revealed new R > L infiltrates. PCCM consulted.   HPI/Subjective: 7/10 A/O 4, negative CP, negative SOB, negative N/V, negative abdominal pain    Assessment/Plan: Acute respiratory failure with hypoxia /ARDS/ -Failed self extubation 6/30 -Currently on room air in no distress.  Pulmonary edema  -Resolved   Pulmonary hypertension -In the future if pulmonary hypertension does not resolve, and her BP will tolerate may benefit from afterload reducing agent such as Imdur. Currently would not start this or any other agent.  Severe sepsis/Pyelonephritis, no organism identified -Patient has completed a 10 day course of multiple antibiotics for her pyelonephritis.  -Leukocytosis or has resolved    AKI, nonoliguric  - resolved  Anemia without overt bleeding, dilution -Stable will continue to monitor closely  Newly diagnosed Diabetes type 2 controlled -Diabetic related to educate patient prior to discharge. Have requested consult.  -Increase Metformin 850 mg BID -Start glipizide 2.5 mg daily -Continue moderate SSI;  -Continue dysphagia 3 carb modified diet -7/8 hemoglobin A1c= 6.9  HLD -Start Lipitor 20 mg  Acute encephalopathy  -Resolved -PT; recommends outpatient PT -OT consult pending -Placed order for patient to follow-up at outpatient [facility] on [address] within one week of discharge.      Code Status: FULL Family Communication: no family present at time of exam Disposition Plan: Outpatient  PT    Consultants: Dr.Daniel Daneil DanJ Feinstein (PCCM)    Procedure/Significant Events: 6/26 CT abd/pelvis: Bilateral pyelonephritis. Both kidneys are diffusely edematous with multi focal areas of abnormal low-density in both kidneys. New hepatomegaly. New edema of the gallbladder wall. No visible stones. The gallbladder is not distended. 6/28 US renal>>>Heterogeneity of the kidneys consistent with the finding seen on recent CT, Gallbladder wall edema without definitive cholelithiasis. 6/28 dopplers>>>neg lowers 6/28 echocardiogram>>>LVEF65%, PA peak pressure 52 7/7 ct head>>>neg    Culture Urine 6/29 >> NEG Resp 6/28 >> NEG Blood 6/27 >> NEG   Antibiotics: Vanc 6/26 >> 6/30 Pip/tazo 6/26 >> 6/30 Ceftaz 6/30 >> 7/2 Ceftriaxone 7/2>>> stopped 7/3   DVT prophylaxis: Subcutaneous heparin   Devices   LINES / TUBES:      Continuous Infusions: . sodium chloride 10 mL/hr (01/07/15 1526)    Objective: VITAL SIGNS: Temp: 98.4 F (36.9 C) (07/10 1216) Temp Source: Oral (07/10 1216) BP: 91/75 mmHg (07/10 1216) Pulse Rate: 78 (07/10 1216) SPO2; FIO2:   Intake/Output Summary (Last 24 hours) at 01/09/15 1443 Last data filed at 01/09/15 1200  Gross per 24 hour  Intake      0 ml  Output    600 ml  Net   -600 ml     Exam: General:A/O 4, patient with some cognitive decline ex. Could not recall name of her position at work,  No acute respiratory distress Eyes: Negative headache, eye pain, double vision, positive bilateral sclera hemorrhage ENT: Negative Runny nose, negative ear pain, negative tinnitus, negative gingival bleeding,  Neck:  Negative scars, masses, torticollis, lymphadenopathy, JVD Lungs: Clear to auscultation bilaterally without wheezes or crackles Cardiovascular: Regular rate and rhythm without murmur gallop  or rub normal S1 and S2 Abdomen:negative abdominal pain, negative dysphagia, Nontender, nondistended, soft, bowel sounds positive, no rebound, no  ascites, no appreciable mass Extremities: No significant cyanosis, clubbing, or edema bilateral lower extremities Psychiatric:  Negative depression, negative anxiety, negative fatigue, negative mania  Neurologic:  Cranial nerves II through XII intact, tongue/uvula midline, all extremities muscle strength 5/5, sensation intact throughout, negative dysarthria, negative expressive aphasia, negative receptive aphasia.      Data Reviewed: Basic Metabolic Panel:  Recent Labs Lab 01/03/15 0340 01/04/15 0440 01/05/15 0400 01/06/15 0350 01/07/15 0500 01/08/15 0235 01/09/15 0247  NA 144 143 146* 144 139 134* 135  K 3.5 3.6 4.0 4.1 3.4* 4.1 3.7  CL 102 101 101 101 96* 94* 98*  CO2 33* 36* 35* 35* 32 29 27  GLUCOSE 229* 263* 174* 181* 146* 229* 159*  BUN 19 21* 30* 35* 27* 24* 18  CREATININE 1.17* 1.10* 1.06* 0.99 1.01* 1.05* 1.01*  CALCIUM 7.9* 8.1* 8.6* 8.9 9.3 8.9 9.1  MG 1.8 2.2 2.2 2.4 2.2 2.2  --   PHOS 4.4  --  4.5 5.2* 3.3 3.2  --    Liver Function Tests:  Recent Labs Lab 01/04/15 0440 01/09/15 0247  AST 30 27  ALT 28 40  ALKPHOS 73 75  BILITOT 0.4 0.6  PROT 6.1* 7.9  ALBUMIN 1.6* 2.7*   No results for input(s): LIPASE, AMYLASE in the last 168 hours. No results for input(s): AMMONIA in the last 168 hours. CBC:  Recent Labs Lab 01/05/15 0400 01/06/15 0350 01/07/15 0500 01/08/15 0235 01/09/15 0247  WBC 12.8* 9.9 12.0* 10.7* 8.6  NEUTROABS 8.6* 6.5 8.9* 7.0 4.7  HGB 8.7* 9.2* 10.0* 10.9* 10.5*  HCT 28.1* 29.7* 32.4* 34.8* 32.9*  MCV 83.9 85.8 82.0 81.9 79.9  PLT 268 300 424* 488* 490*   Cardiac Enzymes:  Recent Labs Lab 01/06/15 0350  CKTOTAL 761*  CKMB 1.4   BNP (last 3 results)  Recent Labs  01/04/15 0440  BNP 17.9    ProBNP (last 3 results) No results for input(s): PROBNP in the last 8760 hours.  CBG:  Recent Labs Lab 01/08/15 2051 01/09/15 0017 01/09/15 0430 01/09/15 0812 01/09/15 1219  GLUCAP 214* 189* 140* 146* 171*    No  results found for this or any previous visit (from the past 240 hour(s)).   Studies:  Recent x-ray studies have been reviewed in detail by the Attending Physician  Scheduled Meds:  Scheduled Meds: . atorvastatin  20 mg Oral q1800  . glipiZIDE  2.5 mg Oral QAC breakfast  . heparin subcutaneous  5,000 Units Subcutaneous 3 times per day  . insulin aspart  0-15 Units Subcutaneous 6 times per day  . insulin glargine  5 Units Subcutaneous Daily  . [START ON 01/10/2015] metFORMIN  850 mg Oral BID WC  . multivitamin with minerals  1 tablet Oral Daily  . polyethylene glycol  17 g Oral Daily    Time spent on care of this patient: 40 mins   Cherylanne Ardelean, Roselind Messier , MD  [facility] Office  [phone] Pager [phone]  On-Call/Text Page:      [email or web address]      password [email or web address]  If 7PM-7AM, please contact night-coverage [email or web address] Password [email or web address] 01/09/2015, 2:43 PM   LOS: 14 days   Care during the described time interval was provided by me .  I have reviewed this patient's available data, including medical history, events of note, physical examination, and all test results as part  of my evaluation. I have personally reviewed and interpreted all radiology studies.   Dia Crawford, MD [phone] Pager

## 2015-01-10 LAB — GLUCOSE, CAPILLARY
GLUCOSE-CAPILLARY: 122 mg/dL — AB (ref 65–99)
Glucose-Capillary: 178 mg/dL — ABNORMAL HIGH (ref 65–99)
Glucose-Capillary: 146 mg/dL — ABNORMAL HIGH (ref 65–99)
Glucose-Capillary: 204 mg/dL — ABNORMAL HIGH (ref 65–99)

## 2015-01-10 NOTE — Progress Notes (Signed)
Speech Language Pathology Treatment: Dysphagia  Patient Details Name: Donna ChristenJanai Darsey MRN: [id number] DOB: 09-Sep-1995 Today's Date: 01/10/2015 Time: [id number] SLP Time Calculation (min) (ACUTE ONLY): 8 min  Assessment / Plan / Recommendation Clinical Impression  Spoke to pt's mom initially (pt in shower) who reported daughter told her she drank a Sprite with no problems Saturday and has been independently drinking thin liquids with nursing encouraging thick liquids. Goal of session is ability to safely upgrade solids and liquids. No indications of decreased airway protection and functional oral phase. Upgrade diet to regular/thin, straws allowed with pills with liquids. Discharge ST.   HPI Other Pertinent Information: 19 y.o. female with a history of Gestational Diabetes, GERD who presents to the ED with complaints of Upper ABD Pain and Left sided Flank Pain x 1 week.Found to have sepsis secondaty to bilateral pyelonephritis, hypotension and tachycardia. Intubated 6/28-7/7(self extubated x 1). Head Ct No acute intracranial pathology. CXR 7/6 interval clearing of airspace opacity bilaterally. Persistent left base atelectasis. Repeat CXR 7/7 improved appearance of the pulmonary interstitium despite persistent mild hypo inflation.   Pertinent Vitals Pain Assessment: No/denies pain  SLP Plan  Discharge SLP treatment due to (comment)    Recommendations Diet recommendations: Regular;Thin liquid Liquids provided via: Cup;Straw Medication Administration: Whole meds with liquid Supervision: Patient able to self feed;Intermittent supervision to cue for compensatory strategies Compensations: Slow rate;Small sips/bites Postural Changes and/or Swallow Maneuvers: Seated upright 90 degrees              Oral Care Recommendations: Oral care BID Follow up Recommendations: None Plan: Discharge SLP treatment due to (comment)    GO     Royce MacadamiaLitaker, Masae Lukacs Willis 01/10/2015, 1:57 PM  Breck CoonsLisa Willis Lonell FaceLitaker  M.Ed [employer]-SLP Pager [phone]

## 2015-01-10 NOTE — Progress Notes (Signed)
Inpatient Diabetes Program Recommendations  AACE/ADA: New Consensus Statement on Inpatient Glycemic Control (2013)  Target Ranges:  Prepandial:   less than 140 mg/dL      Peak postprandial:   less than 180 mg/dL (1-2 hours)      Critically ill patients:  140 - 180 mg/dL    Results for Felicia Holt, Felicia Holt (MRN [id number]) as of 01/10/2015 12:34  Ref. Range 01/10/2015 00:38 01/10/2015 04:26 01/10/2015 08:00 01/10/2015 11:55  Glucose-Capillary Latest Ref Range: 65-99 mg/dL 086122 (H) 578178 (H) 469146 (H) 204 (H)    Results for Felicia Holt, Felicia Holt (MRN [id number]) as of 01/10/2015 12:34  Ref. Range 12/28/2014 01:42 01/07/2015 12:30  Hemoglobin A1C Latest Ref Range: 4.8-5.6 % 5.7 (H) 6.9 (H)      -Received referral for this patient.  New diagnosis of DM.  Patient with History of GDM during her pregnancy (child is now 1111 months old).  -Note that A1c was 5.7% on admission.  Another A1c was drawn over a week and half later (after patient was critically ill and experienced hyperglycemia in the hospital) and the result was 6.9%.  -Note that patient was started on Glipizide 2.5 mg daily along with Metformin 850 mg bid.  Not sure if she truly needs both at discharge.  Patient would likely do OK on Metformin alone.  Will call MD to discuss.  -Spoke with pt about new diagnosis.  Discussed A1C results with her and explained what an A1C is, basic pathophysiology of DM Type 2, basic home care, basic diabetes diet nutrition principles, importance of checking CBGs and maintaining good CBG control to prevent long-term and short-term complications.  Reviewed signs and symptoms of hypoglycemia and how to treat hypoglycemia at home.  Also reviewed blood sugar goals at home.  Encouraged patient to check her CBGs at least once daily at home and to check at different times of the day each day.  Asked patient to record all her CBGs for her PCP to review at each visit.  -RNs to provide ongoing basic DM education at bedside with this patient.  Living Well with DM book and DM videos were ordered for this patient at time of admission.    -Also discussed DM diet information with patient.  Encouraged patient to avoid beverages with sugar (regular soda, sweet tea, lemonade, fruit juice) and to consume mostly water.  Discussed what foods contain carbohydrates and how carbohydrates affect the body's blood sugar levels.  Encouraged patient to be careful with her portion sizes (especially grains, starchy vegetables, and fruits).    -Patient told me she knows what to do b/c she had to "do this" when she was pregnant.  Patient stated she does not plan to stop drinking sweet tea or soda.  When asked about her comfort level with checking her fingerstick glucose levels patient stated she had to check a lot at home and that she knows what to do.  I asked patient if she had any additional questions I could answer.  Patient stated "No".  Her aunt that was present in the room asked patient what was wrong with her.  Patient's mother interjected that patient already knows about DM b/c she had to deal with it during pregnancy and that several family members have DM.   MD- Does patient need to take both Metformin and Glipizide at time of d/c?  She may do OK with the Metformin alone.    Will follow Ambrose FinlandJeannine Johnston Cearra Portnoy RN, MSN, CDE Diabetes Coordinator Inpatient Glycemic Control Team Team  Pager: [phone] (8a-5p)

## 2015-01-10 NOTE — Progress Notes (Signed)
Went to check on patient.  Patient was not in the room.  Patient apparently left AMA.  Patient was in street clothes & matched the description of someone who left the floor, according to the unit secretary.  MD notified.  Peri MarisAndrew Tamecia Mcdougald, MBA, BS, RN

## 2015-01-10 NOTE — Discharge Summary (Signed)
DISCHARGE SUMMARY  - PT LEFT AMA BEFORE SHE COULD BE D/C HOME   Felicia Holt  MR#: [id number]  DOB:1996/01/25  Date of Admission: 12/26/2014 Date of Discharge: 01/10/2015 PT LEFT AMA   Attending Physician:Elora Wolter T  Patient's PCP:Pcp Not In System  Consults: PCCM  Disposition: PT LEFT AMA   Follow-up Appts: Follow-up Information    Follow [facility] with [facility]. Schedule an appointment as soon as possible for a visit in 1 week.   Specialty:  Rehabilitation   Why:  Follow-up hospitalization for acute respiratory failure with hypoxia resulting in acute encephalopathy   Contact information:   [address] [id number] mc [place] [id number] [phone]     Tests Needing Follow-up: PT LEFT AMA   Discharge Diagnoses:  PT LEFT AMA  Acute respiratory failure with hypoxia / ARDS / Acute lung injury  Pulmonary edema  Pulmonary hypertension Severe sepsis w/ MODS / Pyelonephritis, no organism identified AKI, nonoliguric  Anemia without overt bleeding, dilution Newly diagnosed Diabetes type 2 controlled HLD Acute encephalopathy  Morbid obesity - Body mass index is 41.33 kg/(m^2).  Initial presentation: 19 year old F Hx gestational diabetes and morbid obesity who was admitted with flank and abdominal pain x 1 week. She was admitted for sepsis secondary to suspected pyelonephritis. She was placed on broad spectrum antibiotics. 6/28 early AM she became hypoxemic requiring BiPAP. CXR revealed new R > L infiltrates. PCCM consulted and the pt was transferred to the ICU.    Hospital Course:  SIGNIFICANT EVENTS: 6/26 admit for pyelonephritis  6/28 ett placed, ALI, shock, MODS 6/30 self extubated requiring immediate re-intubation 7/1 Tolerated PS 5-10 cm H2O. Too somnolent to extubate. Dexmedetomidine initiated 7/4  Neg 5 liters, to peep 8  7/5  Very agitated even with slightest sedation wean. Needing scheduled  klonopin + dirpivan gtt + fent gtt 7/7  extubated, fall in evening 7/11 PT LEFT AMA   Acute respiratory failure with hypoxia /ARDS -Failed self extubation 6/30 -Currently on room air in no distress.  Pulmonary edema  -Resolved   Pulmonary hypertension -In the future if pulmonary hypertension does not resolve, and her BP will tolerate may benefit from afterload reducing agent such as Imdur. Currently would not start this or any other agent.  Severe sepsis/Pyelonephritis, no organism identified -Patient has completed a 10 day course of multiple antibiotics for her pyelonephritis.  -Leukocytosis has resolved   AKI, nonoliguric  - resolved  Anemia without overt bleeding, dilution -Stable will continue to monitor closely  Newly diagnosed Diabetes type 2  -Diabetic education patient prior to discharge -Began Metformin 850 mg BID -Continue carb modified diet -7/8 hemoglobin A1c= 6.9  HLD -Started Lipitor 20 mg  Acute encephalopathy  -Resolved -PT recommends outpatient PT -OT consult pending -Placed order for patient to follow-up at outpatient neuro rehabilitation Center on [address] within one week of discharge.   Morbid obesity - Body mass index is 41.33 kg/(m^2).  Day of Discharge BP 121/62 mmHg  Pulse 89  Temp(Src) 98.6 F (37 C) (Oral)  Resp 19  Ht 5\' 2"  (1.575 m)  Wt 102.513 kg (226 lb)  BMI 41.33 kg/m2  SpO2 100%  LMP 11/18/2014  Physical Exam: PT LEFT AMA prior to being examined by the MD   Basic Metabolic Panel:  Recent Labs Lab 01/04/15 0440 01/05/15 0400 01/06/15 0350 01/07/15 0500 01/08/15 0235 01/09/15 0247  NA 143 146* 144 139 134* 135  K 3.6 4.0 4.1 3.4* 4.1 3.7  CL 101 101 101 96* 94* 98*  CO2 36* 35* 35* 32 29 27  GLUCOSE 263* 174* 181* 146* 229* 159*  BUN 21* 30* 35* 27* 24* 18  CREATININE 1.10* 1.06* 0.99 1.01* 1.05* 1.01*  CALCIUM 8.1* 8.6* 8.9 9.3 8.9 9.1  MG 2.2 2.2 2.4 2.2 2.2  --   PHOS  --  4.5 5.2* 3.3 3.2  --      Liver Function Tests:  Recent Labs Lab 01/04/15 0440 01/09/15 0247  AST 30 27  ALT 28 40  ALKPHOS 73 75  BILITOT 0.4 0.6  PROT 6.1* 7.9  ALBUMIN 1.6* 2.7*   CBC:  Recent Labs Lab 01/05/15 0400 01/06/15 0350 01/07/15 0500 01/08/15 0235 01/09/15 0247  WBC 12.8* 9.9 12.0* 10.7* 8.6  NEUTROABS 8.6* 6.5 8.9* 7.0 4.7  HGB 8.7* 9.2* 10.0* 10.9* 10.5*  HCT 28.1* 29.7* 32.4* 34.8* 32.9*  MCV 83.9 85.8 82.0 81.9 79.9  PLT 268 300 424* 488* 490*    Cardiac Enzymes:  Recent Labs Lab 01/06/15 0350  CKTOTAL 761*  CKMB 1.4   CBG:  Recent Labs Lab 01/09/15 1957 01/10/15 0038 01/10/15 0426 01/10/15 0800 01/10/15 1155  GLUCAP 116* 122* 178* 146* 204*    Time spent in discharge (includes decision making & examination of pt):  01/10/2015, 2:31 PM   Lonia Blood, MD [facility] Office  [phone] Pager [phone]  On-Call/Text Page:      [email or web address]      password [facility]

## 2015-01-27 ENCOUNTER — Ambulatory Visit: Payer: Medicaid Other | Attending: Internal Medicine | Admitting: Physical Therapy

## 2015-01-27 DIAGNOSIS — R6889 Other general symptoms and signs: Secondary | ICD-10-CM

## 2015-01-27 DIAGNOSIS — R269 Unspecified abnormalities of gait and mobility: Secondary | ICD-10-CM | POA: Insufficient documentation

## 2015-01-28 ENCOUNTER — Encounter: Payer: Self-pay | Admitting: Physical Therapy

## 2015-01-28 NOTE — Therapy (Signed)
[facility] [address] [place], [place], [address] Phone: [phone]   Fax:  [phone]  Physical Therapy Evaluation  Patient Details  Name: Felicia Holt MRN: [id number] Date of Birth: [birth year] Referring Provider:  Lonia Blood, MD  Encounter Date: 01/27/2015      PT End of Session - 01/28/15 0934    Visit Number 1   Number of Visits 1   Authorization Type Medicaid   PT Start Time 1020   PT Stop Time 1102   PT Time Calculation (min) 42 min      Past Medical History  Diagnosis Date  . Gestational diabetes   . GERD (gastroesophageal reflux disease)   . Morbid obesity     Past Surgical History  Procedure Laterality Date  . Tonsillectomy    . Wisdom tooth extraction    . Adenoidectomy    . Cesarean section N/A 02/03/2014    Procedure: CESAREAN SECTION;  Surgeon: Antionette Char, MD;  Location: [facility];  Service: Obstetrics;  Laterality: N/A;    There were no vitals filed for this visit.  Visit Diagnosis:  Abnormality of gait - Plan: PT plan of care cert/re-cert  Decreased activity tolerance - Plan: PT plan of care cert/re-cert      Subjective Assessment - 01/28/15 0928    Subjective Pt. is a 19 yr old female acompanied to PT by her grandmother and 80 month old son.  Pt. presents with cognitive deficits - is unable to answer questions and communicates very minimally.  Grandmother answers all questions; pt is amb. independently without a device                                                                                                                                                          Pt was hospitalized 12-26-14 - 01-10-15 (admitted with abdominal pain)   Patient is accompained by: Family member   Pertinent History acute respiratory failure with hypoxia and encephalopathy;  h/o sepsis;  DM (controlled)   Patient Stated Goals pt. states she does not know; grandmother reports that she  needs to improve her thinking skills to be able to return to work   Currently in Pain? No/denies            [facility] PT Assessment - 01/28/15 0001    Assessment   Medical Diagnosis Acute respirtory failure with hypoxia and encephalopathy   Onset Date/Surgical Date 12/26/14   Prior Therapy in hospital   Precautions   Precautions Other (comment)  decr. cognition   Balance Screen   Has the patient fallen in the past 6 months No   Has the patient had a decrease in activity level because of a fear of falling?  No   Is the patient reluctant to leave their home because  of a fear of falling?  No   Prior Function   Level of Independence Independent with community mobility without device   ROM / Strength   AROM / PROM / Strength AROM;Strength   AROM   Overall AROM  Within functional limits for tasks performed   Strength   Overall Strength Within functional limits for tasks performed   Transfers   Transfers Sit to Stand   Ambulation/Gait   Ambulation/Gait Yes   Ambulation/Gait Assistance 7: Independent   Gait Pattern Within Functional Limits                           PT Education - 01/28/15 0930    Education provided Yes   Education Details Discussed need for ST at this time due to decr. problem-solving skills and decr. cognition; PT is not needed at this time - grandmother reports that pt did have some problems with balance when initially came home from hospital but states that this has resolved and that mobility is not a problem at this time                                                                                                                                       Person(s) Educated Patient;Other (comment)  grandmother   Methods Explanation   Comprehension Verbalized understanding                    Plan - 01/28/15 0935    Clinical Impression Statement PT is not warranted at this time due to mobilit, strength and balance are WNL's - pt needs  ST to address cognitive deficits   PT Frequency 1x / week   PT Duration --  eval only   PT Next Visit Plan N/A -- PT eval only   Recommended Other Services ST    Consulted and Agree with Plan of Care Patient;Family member/caregiver   Family Member Consulted Grandmother         Problem List Patient Active Problem List   Diagnosis Date Noted  . Acute respiratory failure with hypoxia   . Acute pulmonary edema   . Pulmonary hypertension   . Acute pyelonephritis   . Acute kidney injury   . Diabetes type 2, controlled   . Acute encephalopathy   . Encephalopathy acute 01/04/2015  . Acute respiratory failure with hypoxemia   . ARDS (adult respiratory distress syndrome) 12/28/2014  . Acute renal disease   . Severe sepsis 12/27/2014  . Pyelonephritis 12/26/2014  . Sepsis 12/26/2014  . Hypotension 12/26/2014  . AKI (acute kidney injury) 12/26/2014  . Abdominal pain 12/26/2014  . HELLP syndrome, delivered, current hospitalization 02/05/2014  . Postoperative anemia due to acute blood loss 02/04/2014  . Depression 11/25/2013  . Gestational diabetes 11/15/2013    Kary Kos, PT 01/28/2015, 9:41 AM  [facility]  [address] [place], [place], [address] Phone: [phone]   Fax:  [phone]

## 2015-02-01 ENCOUNTER — Ambulatory Visit (INDEPENDENT_AMBULATORY_CARE_PROVIDER_SITE_OTHER): Payer: Medicaid Other | Admitting: Neurology

## 2015-02-01 ENCOUNTER — Encounter: Payer: Self-pay | Admitting: Neurology

## 2015-02-01 ENCOUNTER — Ambulatory Visit: Payer: Medicaid Other

## 2015-02-01 ENCOUNTER — Ambulatory Visit: Payer: Medicaid Other | Attending: Internal Medicine

## 2015-02-01 VITALS — BP 116/74 | HR 90 | Resp 16 | Wt 232.0 lb

## 2015-02-01 DIAGNOSIS — R413 Other amnesia: Secondary | ICD-10-CM | POA: Diagnosis not present

## 2015-02-01 DIAGNOSIS — R4701 Aphasia: Secondary | ICD-10-CM | POA: Diagnosis present

## 2015-02-01 DIAGNOSIS — F329 Major depressive disorder, single episode, unspecified: Secondary | ICD-10-CM

## 2015-02-01 DIAGNOSIS — R41841 Cognitive communication deficit: Secondary | ICD-10-CM | POA: Insufficient documentation

## 2015-02-01 DIAGNOSIS — G931 Anoxic brain damage, not elsewhere classified: Secondary | ICD-10-CM | POA: Diagnosis not present

## 2015-02-01 DIAGNOSIS — F32A Depression, unspecified: Secondary | ICD-10-CM

## 2015-02-01 MED ORDER — CITALOPRAM HYDROBROMIDE 10 MG PO TABS: ORAL_TABLET | ORAL | Status: DC

## 2015-02-01 NOTE — Patient Instructions (Addendum)
These are some tips for Talking with People who have trouble thinking about what they want to say . Say one thing at a time . Don't  rush - slow down, be patient . Reduce background noise . Relax - be natural . Use pen and paper . Write down key words . Draw diagrams or pictures . Don't pretend you understand . Ask what helps . Recap - check you both understand    There are Many Ways for the person to Communicate - have Jai try to  -Describe it -Write it -Draw it -Gesture it -Use related words

## 2015-02-01 NOTE — Patient Instructions (Addendum)
1. Schedule MRI brain without and with contrast 2. Start Speech Therapy  3. Start Citalopram  daily 4. Follow-up in 6 weeks
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
## 2015-02-02 NOTE — Therapy (Signed)
[facility] [address] [place], [place], [address] Phone: [phone]   Fax:  [phone]  Speech Language Pathology Evaluation  Patient Details  Name: Felicia Holt MRN: [id number] Date of Birth: 03/23/1996 Referring Provider:  Drema Dallas, MD  Encounter Date: 02/01/2015      End of Session - 02/02/15 1608    Visit Number 1   Number of Visits 16   Date for SLP Re-Evaluation 04/04/15   Authorization Type awaiting medicaid approval - gave LL info 02-02-15   SLP Start Time 1450   SLP Stop Time  1532   SLP Time Calculation (min) 42 min   Activity Tolerance --  limited by attention/processing time      Past Medical History  Diagnosis Date  . Gestational diabetes   . GERD (gastroesophageal reflux disease)   . Morbid obesity     Past Surgical History  Procedure Laterality Date  . Tonsillectomy    . Wisdom tooth extraction    . Adenoidectomy    . Cesarean section N/A 02/03/2014    Procedure: CESAREAN SECTION;  Surgeon: Antionette Char, MD;  Location: [facility];  Service: Obstetrics;  Laterality: N/A;    There were no vitals filed for this visit.  Visit Diagnosis: Cognitive communication deficit - Plan: SLP plan of care cert/re-cert  Receptive aphasia - Plan: SLP plan of care cert/re-cert  Expressive aphasia - Plan: SLP plan of care cert/re-cert      Subjective Assessment - 02/01/15 1458    Subjective "My mom told me my body went to shock and I woke up two weeks later."   Patient is accompained by: --  alone            SLP Evaluation [facility] - 02/01/15 1520    SLP Visit Information   Onset Date June 2016   Medical Diagnosis pyelonephritis with septic shock and hypoxia   Pain Assessment   Currently in Pain? 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    Prior Functional Status   Cognitive/Linguistic Baseline Within functional  limits    Lives With Family  children   Cognition   Overall Cognitive Status Impaired/Different from baseline   Area of Impairment [place] level   [place] [address] Scales of Cognitive Functioning Confused/appropriate   Attention Sustained   Memory Impaired   Awareness Impaired   Awareness Impairment Emergent impairment   Problem Solving Impaired   Problem Solving Impairment Verbal basic   Behaviors --  reduced processing speed, extremely flat affect   Auditory Comprehension   Overall Auditory Comprehension Impaired   Yes/No Questions Impaired   Commands Impaired   Two Step Basic Commands 50-74% accurate   Conversation Simple   Other Conversation Comments Pt very slow to respond to simple questions; does not elaborate when asked to do so   Interfering Components Attention;Processing speed   EffectiveTechniques Extra processing time   Overall Auditory Comprehension Comments Pt with reduced processing time with all receptive evaluation tasks and in conversation. Requested repeats appropriately approx 70% of the time.   Expression   Primary Mode of Expression Verbal   Verbal Expression   Overall Verbal Expression Impaired   Level of Generative/Spontaneous Verbalization Conversation;Sentence   Naming Impairment   Confrontation --  named 2/3 MOCA animals; pt has short respon in conversation   Oral Motor/Sensory Function   Overall Oral Motor/Sensory Function Appears within functional limits for  tasks assessed   Motor Speech   Overall Motor Speech Appears within functional limits for tasks assessed                         SLP Education - 02/02/15 1607    Education provided Yes   Education Details aphasia tips   Person(s) Educated Patient   Methods Handout;Explanation   Comprehension Verbalized understanding;Need further instruction          SLP Short Term Goals - 02/02/15 1616    SLP SHORT TERM GOAL #1   Title pt will sustain/select attention to simple  cognitive linguistic therapy tasks in quiet environment for 5 minutes   Baseline approx 2 minutes in quiet therapy room   Time 4   Period Weeks   Status New   SLP SHORT TERM GOAL #2   Title pt will perform a cognitive linguistic task in a minimally noisy environment   Baseline 2 minutes in quiet therapy room   Time 4   Period Weeks   Status New   SLP SHORT TERM GOAL #3   Title answer yes/no questions 90% within average 3 seconds   Baseline 5 seconds, 75% success   Time 4   Period Weeks   Status New   SLP SHORT TERM GOAL #4   Title follow 2-step directions ("X then X") with 90% success within average 2.5 seconds   Baseline 4.25 seconds, 100%   Time 4   Period Weeks   Status New          SLP Long Term Goals - 02/02/15 1628    SLP LONG TERM GOAL #1   Title demo sustained/selective attention in simple conversation by responding in average <5 seconds   Baseline 9 seconds   Time 8   Period Weeks   Status New   SLP LONG TERM GOAL #2   Title demo emergent awareness in simple cognitive linguistic organization/reasoning tasks 70% of the time with verbal cues rarely.   Baseline 0% awareness   Time 8   Period Weeks   Status New          Plan - 02/02/15 1610    Clinical Impression Statement Pt presents with moderate expressive language and mod to severe receptive language deficits due to hypoxia. Pt discharged from hospital 01-10-15 and also presents with severe cognitive-linguistic deficits including at least attention and awareness. Pt would benefit from skilled ST to address these deficits to decr caregiver burden and increase chances for greater independence.   Speech Therapy Frequency 2x / week   Duration --  8 weeks (or 16 visits)   Treatment/Interventions Cognitive reorganization;SLP instruction and feedback;Compensatory strategies;Internal/external aids;Patient/family education;Functional tasks;Cueing hierarchy   Potential to Achieve Goals Fair   Potential  Considerations Ability to learn/carryover information;Family/community support;Severity of impairments;Financial resources   Consulted and Agree with Plan of Care Patient        Problem List Patient Active Problem List   Diagnosis Date Noted  . Hypoxic encephalopathy 02/01/2015  . Memory loss 02/01/2015  . Acute respiratory failure with hypoxia   . Acute pulmonary edema   . Pulmonary hypertension   . Acute pyelonephritis   . Acute kidney injury   . Diabetes type 2, controlled   . Acute encephalopathy   . Encephalopathy acute 01/04/2015  . Acute respiratory failure with hypoxemia   . ARDS (adult respiratory distress syndrome) 12/28/2014  . Acute renal disease   . Severe sepsis 12/27/2014  .  Pyelonephritis 12/26/2014  . Sepsis 12/26/2014  . Hypotension 12/26/2014  . AKI (acute kidney injury) 12/26/2014  . Abdominal pain 12/26/2014  . HELLP syndrome, delivered, current hospitalization 02/05/2014  . Postoperative anemia due to acute blood loss 02/04/2014  . Depression 11/25/2013  . Gestational diabetes 11/15/2013    [facility] , MS, CCC-SLP  02/02/2015, 4:36 PM  [facility] [address] [place], [place], [address] Phone: [phone]   Fax:  [phone]

## 2015-02-04 ENCOUNTER — Telehealth: Payer: Self-pay | Admitting: Family Medicine

## 2015-02-04 NOTE — Telephone Encounter (Signed)
Letter for work was faxed to patient's HR manager/Analaura @ [phone].

## 2015-02-07 ENCOUNTER — Telehealth: Payer: Self-pay | Admitting: *Deleted

## 2015-02-07 NOTE — Telephone Encounter (Signed)
Analaura Romnan from [employer] called she received the patients letter to be out of work she just needs to know a round about time frame for her to e out of work Call back [phone]

## 2015-02-08 ENCOUNTER — Ambulatory Visit (HOSPITAL_COMMUNITY): Admission: RE | Admit: 2015-02-08 | Payer: Medicaid Other | Source: Ambulatory Visit

## 2015-02-08 ENCOUNTER — Ambulatory Visit (HOSPITAL_COMMUNITY): Payer: Medicaid Other

## 2015-02-08 NOTE — Telephone Encounter (Signed)
Returned call to patient's supervisor/Annalaura. Patient will be out of work at least until her next office visit which on 9/14. Work status will be re-addressed at that follow-up visit.

## 2015-02-11 ENCOUNTER — Ambulatory Visit (HOSPITAL_COMMUNITY)
Admission: RE | Admit: 2015-02-11 | Discharge: 2015-02-11 | Disposition: A | Discharge status: Home / Self Care | Payer: Medicaid Other | Source: Ambulatory Visit | Attending: Neurology | Admitting: Neurology

## 2015-02-11 DIAGNOSIS — R410 Disorientation, unspecified: Secondary | ICD-10-CM | POA: Diagnosis not present

## 2015-02-11 DIAGNOSIS — R413 Other amnesia: Secondary | ICD-10-CM | POA: Diagnosis present

## 2015-02-11 MED ORDER — GADOBENATE DIMEGLUMINE 529 MG/ML IV SOLN
20.0000 mL | Freq: Once | INTRAVENOUS | Status: AC | PRN
  Administered 2015-02-11: 20 mL via INTRAVENOUS

## 2015-02-15 ENCOUNTER — Telehealth: Payer: Self-pay | Admitting: Family Medicine

## 2015-02-15 NOTE — Telephone Encounter (Signed)
Returned call

## 2015-02-15 NOTE — Telephone Encounter (Signed)
I spoke with patient's mother & notified her of results.

## 2015-02-15 NOTE — Telephone Encounter (Signed)
-----   Message from Van Clines, MD sent at 02/15/2015  8:39 AM EDT ----- Pls let patient/mother know I reviewed MRI, and brain is normal, there is no evidence of scar or permanent injury, no tumor, stroke, or bleed seen. Thanks

## 2015-02-15 NOTE — Telephone Encounter (Signed)
Lmovm for patient's mother/Leticia to return my call.

## 2015-02-22 ENCOUNTER — Ambulatory Visit: Payer: Self-pay | Admitting: Neurology

## 2015-02-24 ENCOUNTER — Telehealth: Payer: Self-pay | Admitting: Neurology

## 2015-02-24 NOTE — Telephone Encounter (Signed)
Pt would like to go back to work. Please call her at [phone]

## 2015-02-24 NOTE — Telephone Encounter (Signed)
Pls ask her mother how she is doing, patient seems to have minimal insight to her condition. Did she go to Psych? Would like to see her in clinic before return to work

## 2015-02-24 NOTE — Telephone Encounter (Signed)
Are you ok with her going back to work? Work letter stated that she would be out of work until her f/u appt with you on 9/16.

## 2015-02-25 NOTE — Telephone Encounter (Signed)
I spoke with patient's mother. Her f/u appt has been moved to Tuesday.

## 2015-03-01 ENCOUNTER — Ambulatory Visit (INDEPENDENT_AMBULATORY_CARE_PROVIDER_SITE_OTHER): Payer: Medicaid Other | Admitting: Neurology

## 2015-03-01 ENCOUNTER — Telehealth: Payer: Self-pay | Admitting: Family Medicine

## 2015-03-01 ENCOUNTER — Encounter: Payer: Self-pay | Admitting: Neurology

## 2015-03-01 VITALS — BP 114/82 | HR 85 | Resp 16 | Wt 236.0 lb

## 2015-03-01 DIAGNOSIS — F32A Depression, unspecified: Secondary | ICD-10-CM

## 2015-03-01 DIAGNOSIS — F329 Major depressive disorder, single episode, unspecified: Secondary | ICD-10-CM

## 2015-03-01 DIAGNOSIS — G9340 Encephalopathy, unspecified: Secondary | ICD-10-CM | POA: Diagnosis not present

## 2015-03-01 MED ORDER — CITALOPRAM HYDROBROMIDE 10 MG PO TABS: ORAL_TABLET | ORAL | Status: DC

## 2015-03-01 NOTE — Patient Instructions (Signed)
1. Refer to Psychiatry and Psychotherapy for depression 2. Call speech therapy to continue sessions 3. Increase Celexa : Take 2 tablets daily 4. Follow-up in 1 month
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
## 2015-03-01 NOTE — Telephone Encounter (Signed)
I spoke with patient's mother and gave her information for [facility] so patient could start therapy [address] In [place]. Explained to her that the initial appt with [facility] is on a walk-in first come first serve basis. They can walk in Mon-Fri. Hours of operation are 830am-5pm. I did advise her when they go to try to be there 1st thing so the wait wouldn't be as long. She states that she would take patient on this upcoming Tuesday.

## 2015-03-05 IMAGING — US US OB FOLLOW-UP
1 series · 12 of 28 positions shown · non-contrast
Comparison: none

[Series 1: us ob follow up · 42 acquisitions, 12 frames shown]
[im 2/42]
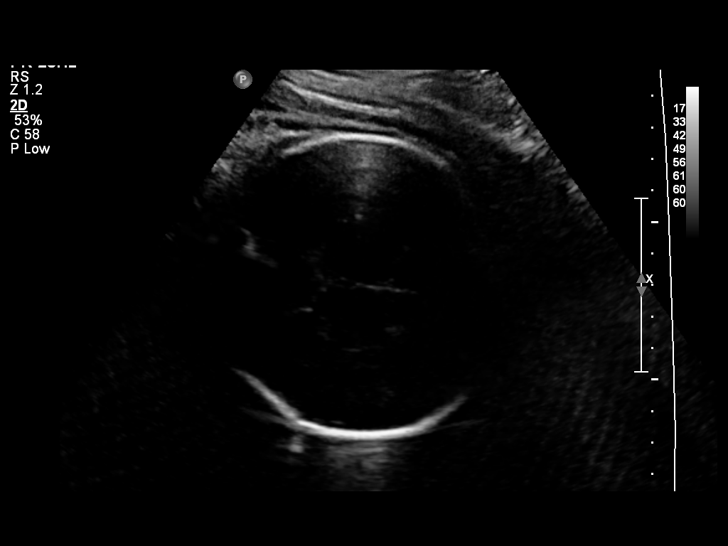
[im 5/42]
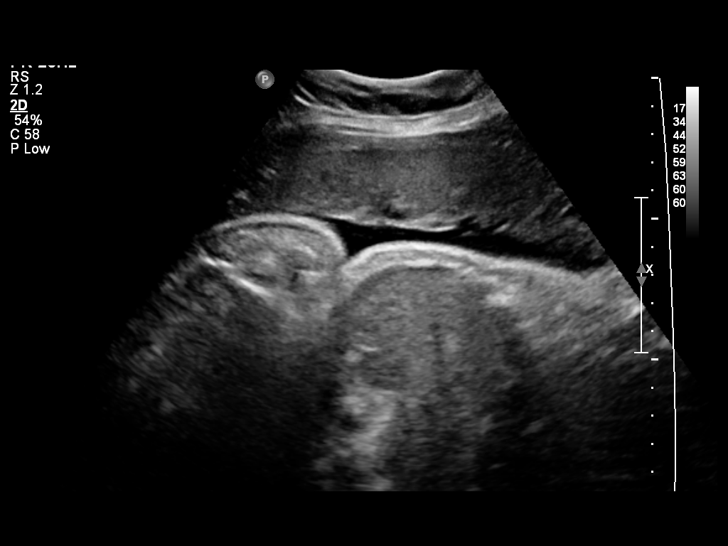
[im 8/42]
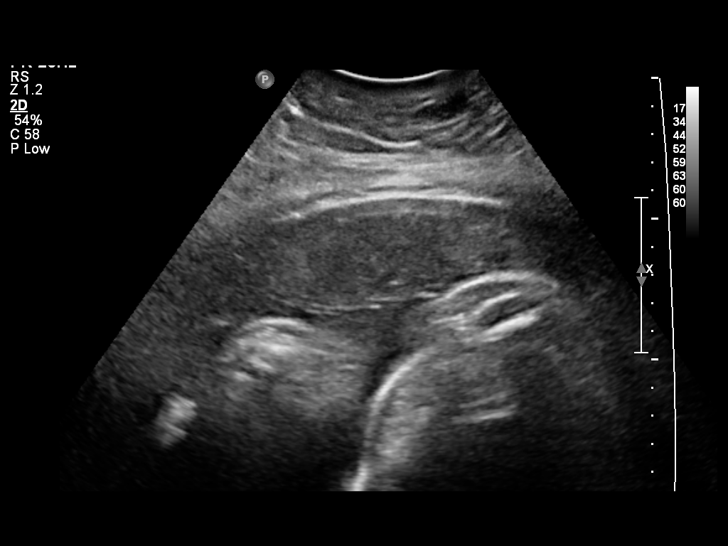
[im 13/42]
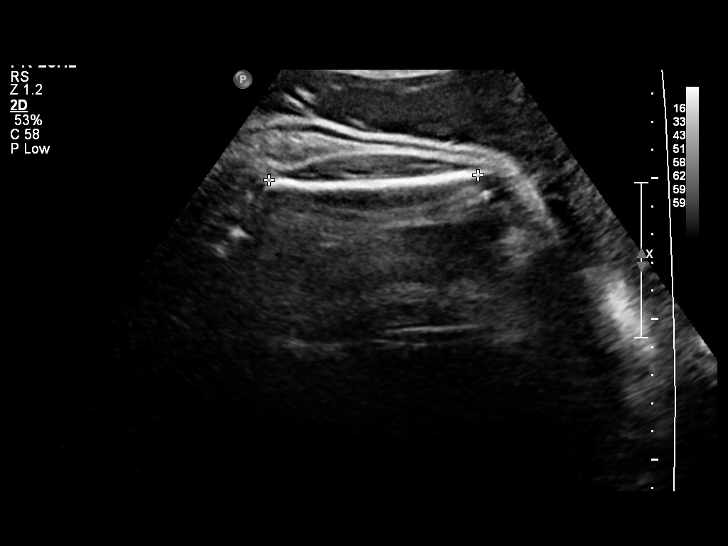
[im 16/42]
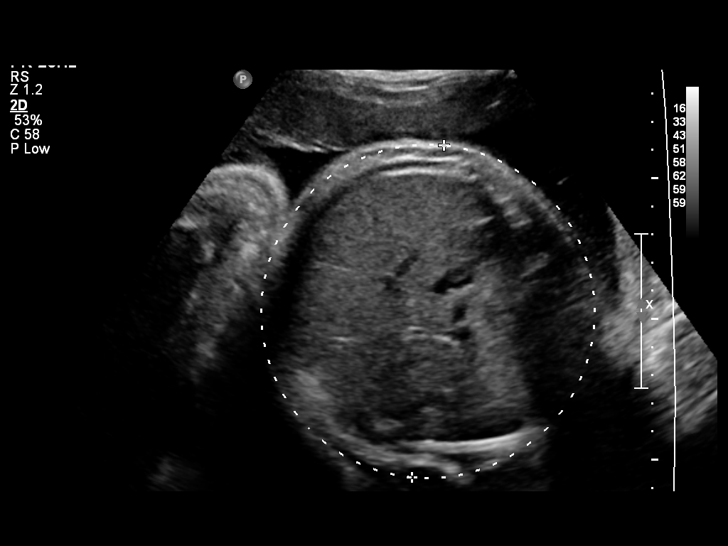
[im 19/42]
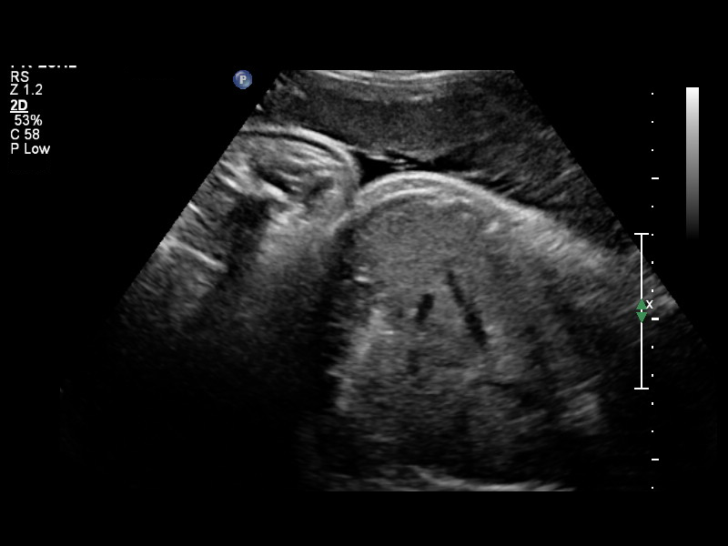
[im 23/42]
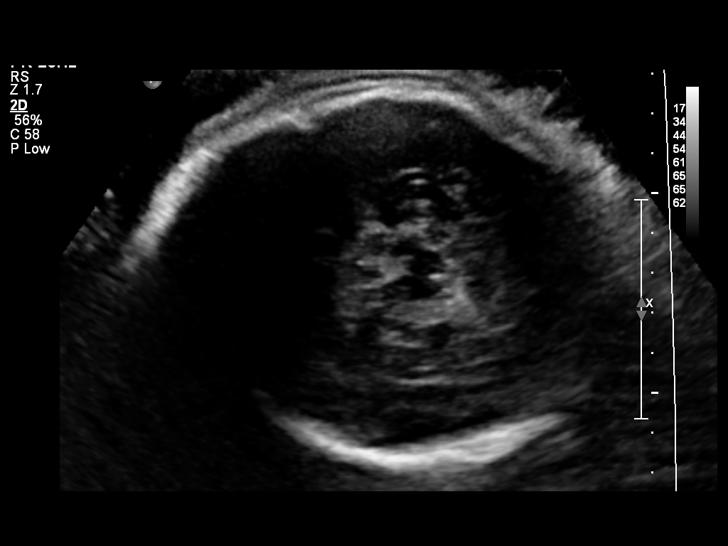
[im 26/42]
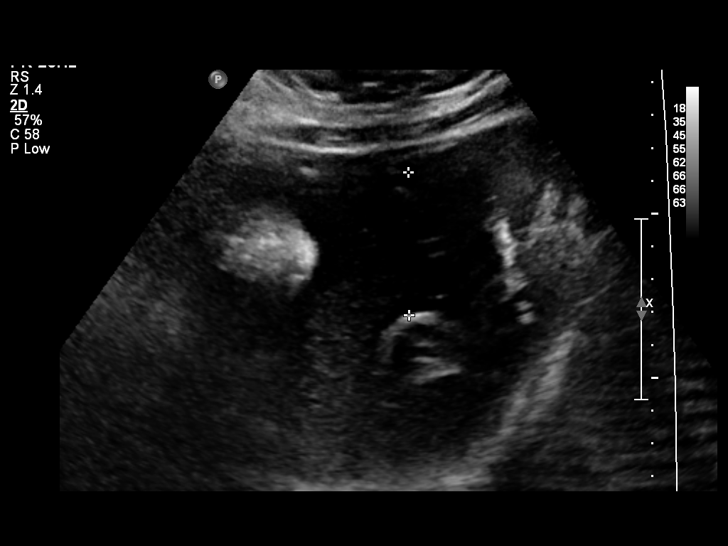
[im 29/42]
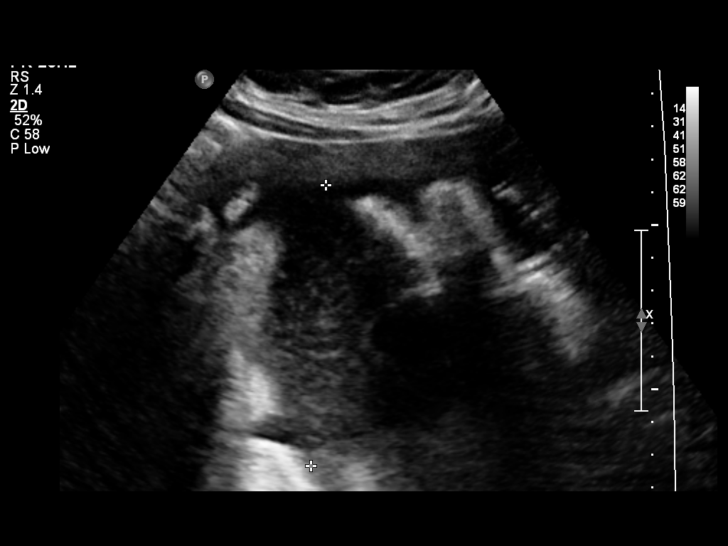
[im 34/42]
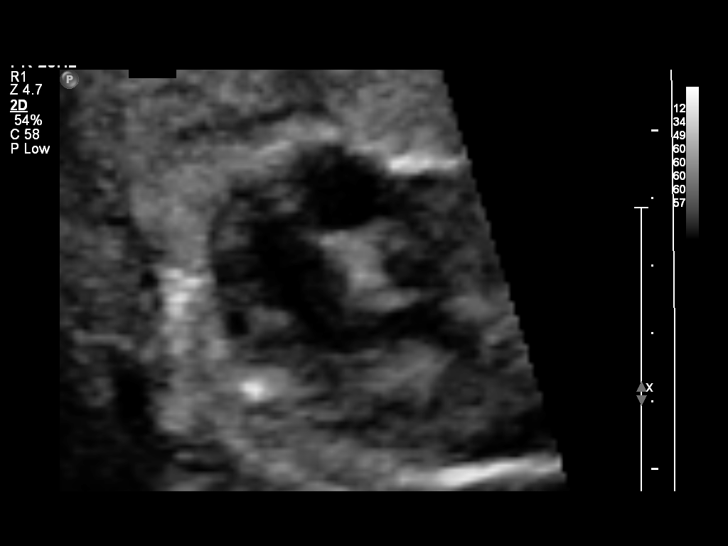
[im 37/42]
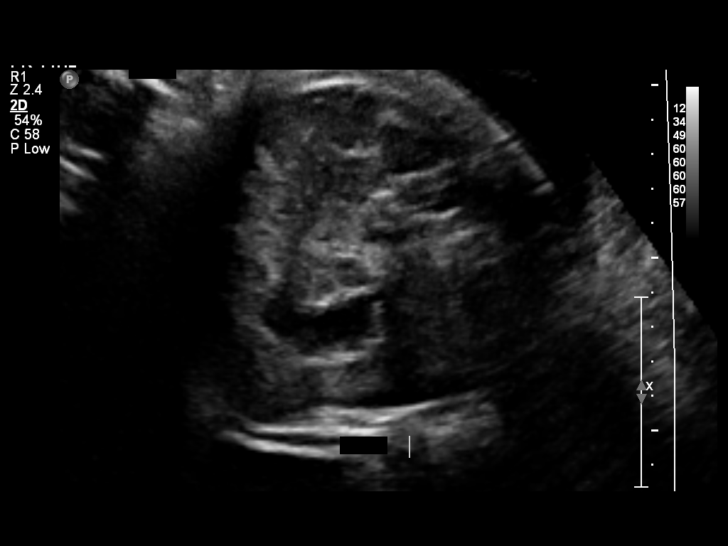
[im 40/42]
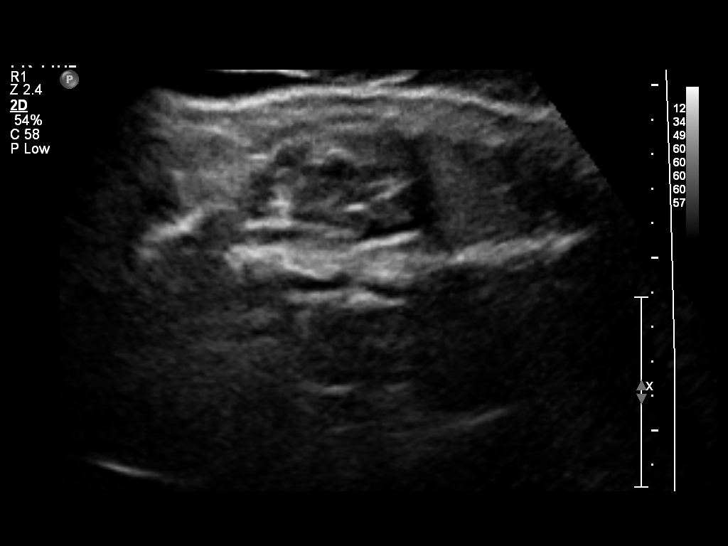

[12 of 28 positions shown; findings below may reference images not displayed]

OBSTETRICS REPORT
                      (Signed Final 01/19/2014 [DATE])

Service(s) Provided

 US OB FOLLOW UP                                       76816.1
Indications

 Diabetes - Gestational, A2 (medication controlled)
 Follow-up incomplete fetal anatomic evaluation
 Maternal morbid obesity BMI =
 Macrosomia
Fetal Evaluation

 Num Of Fetuses:    1
 Fetal Heart Rate:  127                          bpm
 Cardiac Activity:  Observed
 Presentation:      Cephalic
 Placenta:          Anterior, above cervical os

 Amniotic Fluid
 AFI FV:      Subjectively upper-normal
 AFI Sum:     22.72   cm       90  %Tile     Larg Pckt:    8.54  cm
 RUQ:   8.54    cm   RLQ:    4.34   cm    LUQ:   4.08    cm   LLQ:    5.76   cm
Biometry

 BPD:     93.5  mm     G. Age:  38w 0d                CI:        77.79   70 - 86
                                                      FL/HC:      22.1   20.9 -

 HC:     335.5  mm     G. Age:  38w 3d       40  %    HC/AC:      0.91   0.92 -

 AC:     369.7  mm     G. Age:  40w 6d     > 97  %    FL/BPD:     79.3   71 - 87
 FL:      74.1  mm     G. Age:  38w 0d       50  %    FL/AC:      20.0   20 - 24
 HUM:     64.1  mm     G. Age:  37w 1d       62  %

 Est. FW:    9456  gm      8 lb 8 oz   > 90  %
Gestational Age

 U/S Today:     38w 6d                                        EDD:   01/27/14
 Best:          38w 0d     Det. By:  Previous Ultrasound      EDD:   02/02/14
Anatomy

 Cranium:          Appears normal         Aortic Arch:      Previously seen
 Fetal Cavum:      Previously seen        Ductal Arch:      Previously seen
 Ventricles:       Appears normal         Diaphragm:        Appears normal
 Choroid Plexus:   Previously seen        Stomach:          Appears normal
 Cerebellum:       Previously seen        Abdomen:          Appears normal
 Posterior Fossa:  Previously seen        Abdominal Wall:   Previously seen
 Nuchal Fold:      Not applicable (>20    Cord Vessels:     Appears normal (3
                   wks GA)                                  vessel cord)
 Face:             Orbits previously      Kidneys:          Appear normal
                   seen
 Lips:             Appears normal         Bladder:          Appears normal
 Heart:            Appears normal         Spine:            Previously seen
                   (4CH, axis, and
                   situs)
 RVOT:             Not well visualized    Lower             Previously seen
                                          Extremities:
 LVOT:             Appears normal         Upper             Previously seen
                                          Extremities:

 Other:  Fetus appears to be a male. Heels previously visualized.  Right 5th
         previously visualized.  Technically difficult due to maternal habitus
         and fetal position.
Cervix Uterus Adnexa

 Cervix:       Not visualized (advanced GA >40wks)

 Left Ovary:    Previously seen.
 Right Ovary:   Previously seen
Impression

 SIUP at 38+0 weeks
 Normal interval anatomy; anatomic survey complete; limited
 views of RVOT
 Normal amniotic fluid volume
 EFW > 90th %tile; AC > 97th %tile; at risk to be
 LGA/macrosomic
Recommendations

 Follow-up as clinically indicated

 questions or concerns.

## 2015-03-14 ENCOUNTER — Telehealth: Payer: Self-pay | Admitting: Neurology

## 2015-03-14 NOTE — Telephone Encounter (Signed)
Pt called requesting a note to go back to work/ call back at/ [phone]

## 2015-03-15 NOTE — Telephone Encounter (Signed)
Would have her see the psychiatrist at least 2 times first before clearance for return to work. Thanks

## 2015-03-15 NOTE — Telephone Encounter (Signed)
Please review

## 2015-03-16 ENCOUNTER — Ambulatory Visit: Payer: Self-pay | Admitting: Neurology

## 2015-03-17 NOTE — Telephone Encounter (Signed)
Tried calling patient again on the number left. The number belonged to a friend of the patient and the patient was no longer with her, she stated that the patient was back at home. Tried calling patient on number in her chart and outgoing msg stated that calls couldn't be accepted at this time.

## 2015-03-21 ENCOUNTER — Telehealth: Payer: Self-pay | Admitting: Neurology

## 2015-03-21 NOTE — Telephone Encounter (Signed)
Pt called and said Dr Karel Jarvis never called her back from last week in regards to going back to work/Dawn CB# [phone]

## 2015-03-21 NOTE — Telephone Encounter (Signed)
I spoke with Felicia Holt about the msg she left last week. She states she's not able to see the psychiatrist due to not having a way to get to the appts. She states she feels like she is ready to go back to her old job she just needs to be released. She is aware that you are out of the office until Wed.    [phone].

## 2015-03-25 NOTE — Telephone Encounter (Signed)
Please review

## 2015-03-25 NOTE — Telephone Encounter (Signed)
Spoke to mother. She has not been to Psychiatry yet,   Starts a lot of commotion at home, by her not having her income, not being able to get what she wants, arguments and depression comes in. Had called doctor's office pretending to be her mother, saying that her mother had threw her and grandson out. Mother reports this is untrue, she is taking care of all of them.  Job called her that they needed updated letter. She has an appt on 10/17. Discussed with mother that I would recommend she see psychiatry first before returning to work. She will bring [place] to [place] on Monday.

## 2015-03-25 NOTE — Telephone Encounter (Signed)
Patient mother Debbe Odea called and would like to speak to someone about her daughter work please call [phone]

## 2015-03-28 ENCOUNTER — Encounter: Payer: Self-pay | Admitting: Neurology

## 2015-03-28 ENCOUNTER — Telehealth: Payer: Self-pay | Admitting: Neurology

## 2015-03-28 NOTE — Telephone Encounter (Signed)
Letter done, mother had requested this last week. Told mother to do psych first before we consider clearance for work. Thanks

## 2015-03-28 NOTE — Telephone Encounter (Signed)
Pt's Human Resource Generalist/Shaunita/ from [employer] called for a recent note for Pt's job//call back @ [phone]

## 2015-03-28 NOTE — Telephone Encounter (Signed)
Any thoughts on this?

## 2015-03-29 ENCOUNTER — Telehealth: Payer: Self-pay | Admitting: Neurology

## 2015-03-29 NOTE — Telephone Encounter (Signed)
Pt returned call/ [phone]

## 2015-03-29 NOTE — Telephone Encounter (Signed)
Lmovm to return my call. 

## 2015-03-29 NOTE — Telephone Encounter (Signed)
Felicia Holt called requesting a note before 03/31/15// giving permission to return to work//call back @ [phone]

## 2015-03-29 NOTE — Telephone Encounter (Signed)
I spoke with [place] at patient's job. Will fax updated work note to her at [phone].

## 2015-03-29 NOTE — Telephone Encounter (Signed)
I spoke with patient about ok'd return to work with restrictions. She is going to call her job tomorrow and see if these restrictions would be ok for her to return to work. She will call me back tomorrow and let me know if employer will approve restrictions, if so we will do a work note for her.

## 2015-03-29 NOTE — Telephone Encounter (Signed)
If they will allow her to start with part-time, we can start with that (4 hours, 4 days of the week, if possible). Thanks

## 2015-03-29 NOTE — Telephone Encounter (Signed)
I spoke with Felicia Holt. She states that HR did get the note that you wrote for her, but she was informed by them today that her 12 week medical leave period has ended and she had 1 of 2 options. 1 being that she could resign and be eligible for re-hire in 60 days or they could just terminate her. She states she would like to keep her job and she was wanting to know if they allowed her to come back with restrictions or not working a full time schedule would you be ok with that.

## 2015-03-30 ENCOUNTER — Telehealth: Payer: Self-pay | Admitting: Family Medicine

## 2015-03-30 NOTE — Telephone Encounter (Signed)
Expand All Collapse All   Pt called and said she was returning your call in regards to a letter for her to return to work part time/Dawm CB# [phone]

## 2015-03-30 NOTE — Telephone Encounter (Signed)
Pt called and said she was returning your call in regards to a letter for her to return to work part time/Dawm CB# [phone]

## 2015-03-30 NOTE — Telephone Encounter (Signed)
I spoke with patient's mother. Patient's employer did approve for her to come back to work on a part time basis for right now with working 4 hours days 4 days a week. Will fax letter to her employer with notated restrictions.  Note faxed to patient's job @ [phone]

## 2015-03-31 ENCOUNTER — Telehealth: Payer: Self-pay | Admitting: Neurology

## 2015-03-31 NOTE — Telephone Encounter (Signed)
I returned call to patient's mother. She wanted to know if we could hold off on filling out the Accommodation form that patient's job [employer] faxed over after they received a return to work letter from [place] yesterday. Since the form is detailing patient dx and sxs patient needed to sign release for [place] to be able to release this information to her job. Patient did come in to sign the release today but shortly after I received a call from her mother after patient explained to her why she had to sign the release. Her mother is concerned about patient's job knowing details about patient's dx. She is going to check with her lawyer and see what options [place] would have if she doesn't want them to know the details of her dx/condition. I did tell her that we can't fill out this form and fax to her employer without her written permission. She's is questioning if patient could lose her job if they don't receive this form. Once she hears from the lawyer she will call me and let me know how they want to proceed. We will hold off on completing form until she calls me back.

## 2015-03-31 NOTE — Telephone Encounter (Signed)
Pt/mother/Felicia Holt/called for more info on the paperwork for her daughters job/ call back @ [phone]

## 2015-04-04 ENCOUNTER — Telehealth: Payer: Self-pay | Admitting: Neurology

## 2015-04-04 NOTE — Telephone Encounter (Signed)
PT's mother Philis Pique called in regards to a letter for her daughters job/Dawn CB# [phone]

## 2015-04-04 NOTE — Telephone Encounter (Signed)
I returned call to patient's mother. She wanted to let me know that it was ok for [place] to go ahead and fax the accomodation paperwork back to patient's job. She states that the patient was to get back to work asap so she is ok with them receiving details about her condition. Once form is completed will fax to her job.

## 2015-04-15 ENCOUNTER — Telehealth: Payer: Self-pay | Admitting: Neurology

## 2015-04-15 NOTE — Telephone Encounter (Signed)
Lmovm to rtn my call. 

## 2015-04-15 NOTE — Telephone Encounter (Signed)
Pt changed her f/u appt due to her work schedule/ wants to know will Dr. Karel JarvisAquino f/u with her job/concerning light duty//call back @ [phone]

## 2015-04-18 ENCOUNTER — Ambulatory Visit: Payer: Self-pay | Admitting: Neurology

## 2015-04-20 ENCOUNTER — Telehealth: Payer: Self-pay | Admitting: Neurology

## 2015-04-20 NOTE — Telephone Encounter (Signed)
PT's mother called in regards to an update for her job performance/Dawn CB# [phone]

## 2015-04-20 NOTE — Telephone Encounter (Signed)
I spoke with patient's mom. Patient had to reschedule her f/u to 11/17. Needs new work letter sent to her job extending her accomodations until her f/u on 11/17. I will fax to patient's employer [phone].

## 2015-05-19 ENCOUNTER — Ambulatory Visit: Payer: Self-pay | Admitting: Neurology

## 2015-05-20 NOTE — Therapy (Signed)
[facility] [address] [place], [place], [address] Phone: [phone]   Fax:  [phone]  Patient Details  Name: Felicia Holt MRN: [id number] Date of Birth: Jan 01, 1996 Referring Provider:  No ref. provider found  Encounter Date: 05/20/2015  Pt was not scheduled for therapy sessions at the time of eval. To this clinician's knowledge pt has not scheduled follow up ST sessions.   Goals are below, as well as clinical impressions from eval 02-02-15.  SLP SHORT TERM GOAL #1      Title  pt will sustain/select attention to simple cognitive linguistic therapy tasks in quiet environment for 5 minutes     Baseline  approx 2 minutes in quiet therapy room     Time  4     Period  Weeks     Status  New     SLP SHORT TERM GOAL #2     Title  pt will perform a cognitive linguistic task in a minimally noisy environment     Baseline  2 minutes in quiet therapy room     Time  4     Period  Weeks     Status  New     SLP SHORT TERM GOAL #3     Title  answer yes/no questions 90% within average 3 seconds     Baseline  5 seconds, 75% success     Time  4     Period  Weeks     Status  New     SLP SHORT TERM GOAL #4     Title  follow 2-step directions ("X then X") with 90% success within average 2.5 seconds     Baseline  4.25 seconds, 100%     Time  4     Period  Weeks     Status  New                SLP Long Term Goals - 02/02/15 1628     SLP LONG TERM GOAL #1     Title  demo sustained/selective attention in simple conversation by responding in average <5 seconds     Baseline  9 seconds     Time  8     Period  Weeks     Status  New     SLP LONG TERM GOAL #2     Title  demo emergent awareness in simple cognitive linguistic organization/reasoning tasks 70% of the time with verbal cues rarely.     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 Pt discharged from hospital 01-10-15 and also presents with severe cognitive-linguistic deficits including at least attention and awareness. Pt would benefit from skilled ST to address these deficits to decr caregiver burden and increase chances for greater independence.    It is assumed all deficits remain. If pt remains appropriate for ST she may still desire speech therapy. If so, please re-refer pt. Thank you.  [facility]IN[facility],CARL , MS, CCC-SLP 05/20/2015, 2:00 PM  [facility] [address] [place], [place], [address] Phone: [phone]   Fax:  [phone]

## 2015-07-26 ENCOUNTER — Ambulatory Visit: Payer: Self-pay | Admitting: Neurology

## 2016-03-27 IMAGING — MR MR HEAD WO/W CM
9 of 12 series · 36 of 48 positions shown · IV contrast (multihance)
Comparison: Head CT 01/06/2015

CLINICAL DATA: Memory loss.  Confusion.

EXAM:
MRI HEAD WITHOUT AND WITH CONTRAST
TECHNIQUE: Multiplanar, multiecho pulse sequences of the brain and surrounding
structures were obtained without and with intravenous contrast.
CONTRAST:  20mL MULTIHANCE GADOBENATE DIMEGLUMINE 529 MG/ML IV SOLN

[Series 3: T1 · sagittal · 5.0mm · 0.47mm/px · 2 of 24 slices shown]
[im 1/24]
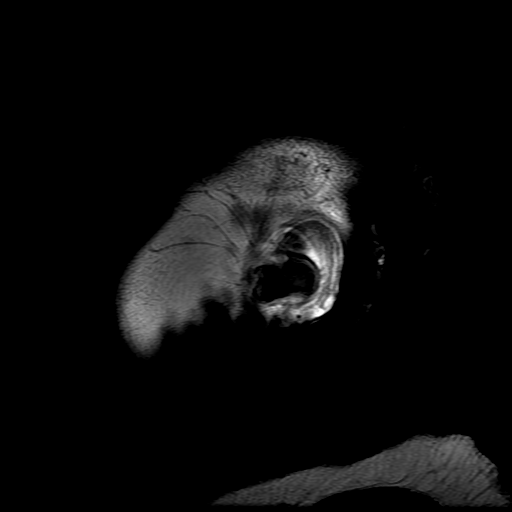
[im 24/24]
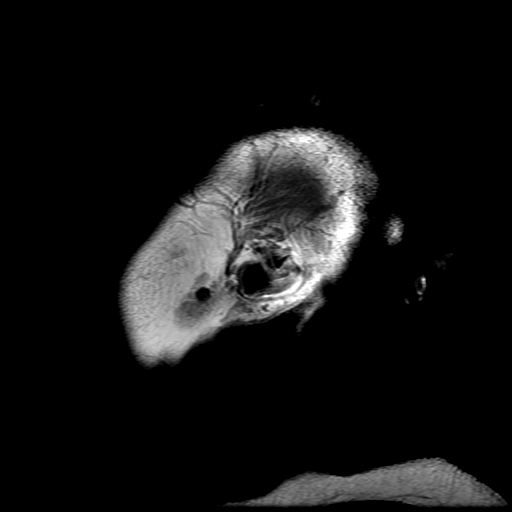

[Series 4: DWI · axial · 3.0mm · 1.09mm/px · z∈[-12,+126]mm · 9 of 94 slices shown (1 of 4)]
[im 1/94]
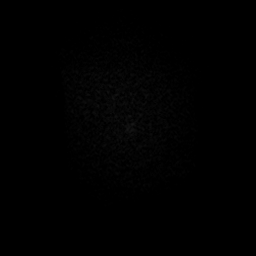
[im 12/94]
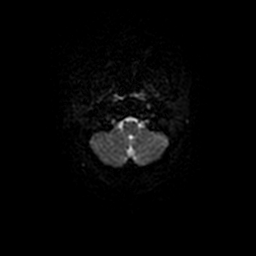
[im 24/94]
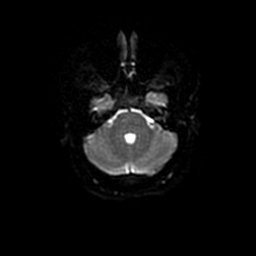
[im 35/94]
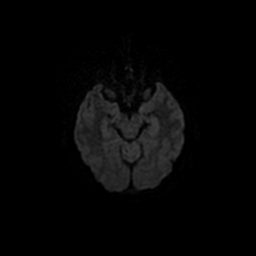
[im 47/94]
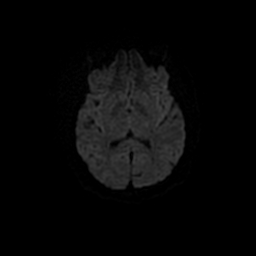
[im 59/94]
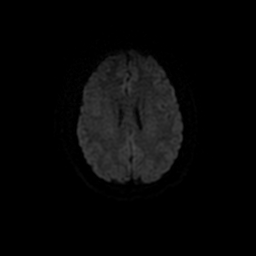
[im 70/94]
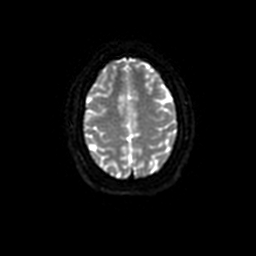
[im 82/94]
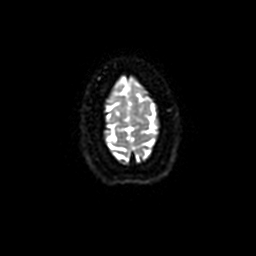
[im 94/94]
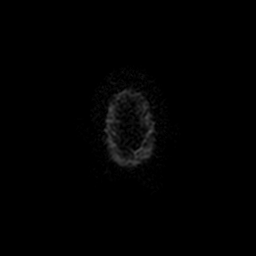

[Series 5: DWI · coronal · 5.0mm · 1.09mm/px · 7 of 70 slices shown (2 of 4)]
[im 1/70]
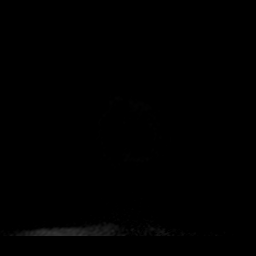
[im 12/70]
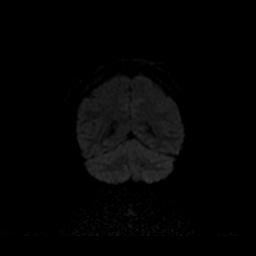
[im 24/70]
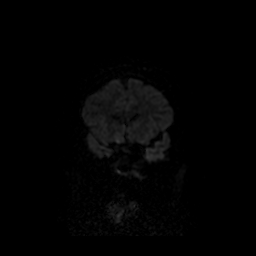
[im 35/70]
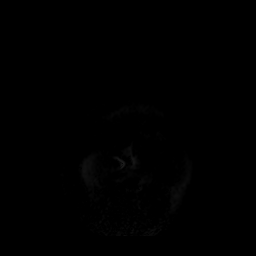
[im 47/70]
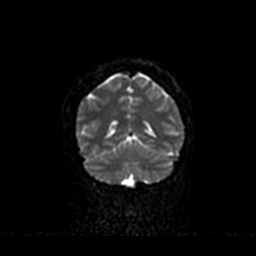
[im 58/70]
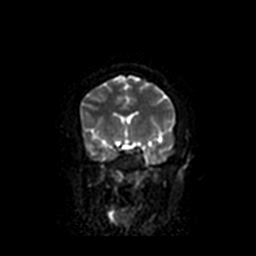
[im 70/70]
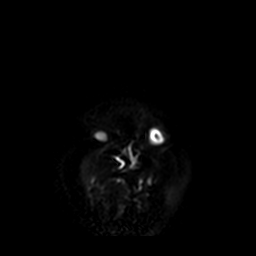

[Series 6: T2 · axial · 5.0mm · 0.43mm/px · z∈[-23,+127]mm · 2 of 24 slices shown]
[im 1/24]
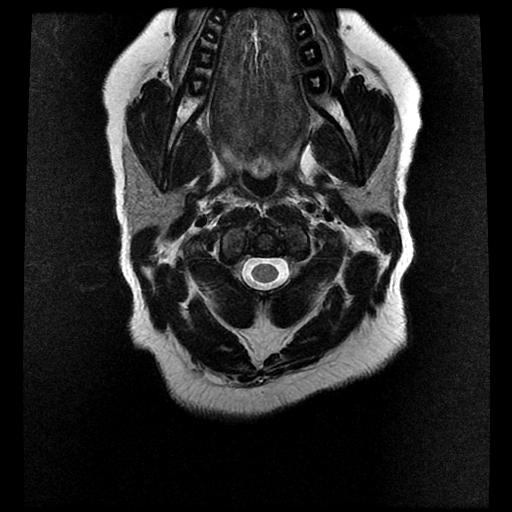
[im 24/24]
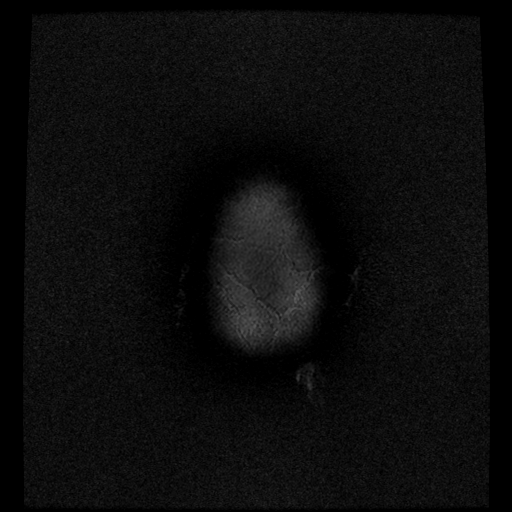

[Series 7: FLAIR · axial · 5.0mm · 0.43mm/px · z∈[-28,+133]mm · 2 of 24 slices shown]
[im 1/24]
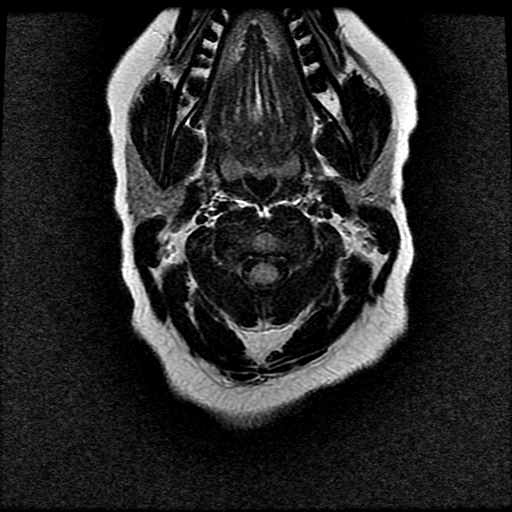
[im 24/24]
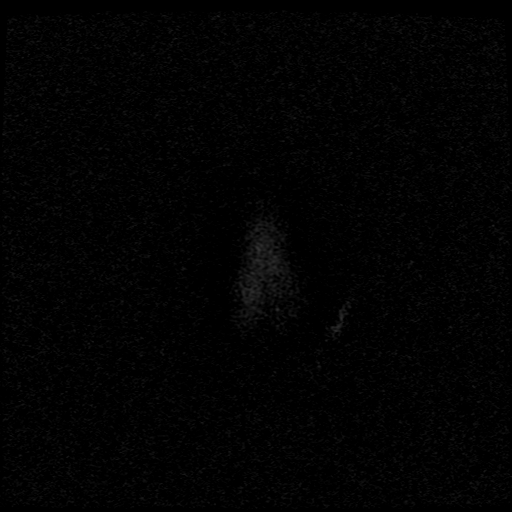

[Series 11: T2 post-contrast · coronal · 5.0mm · 0.45mm/px · 3 of 26 slices shown]
[im 1/26]
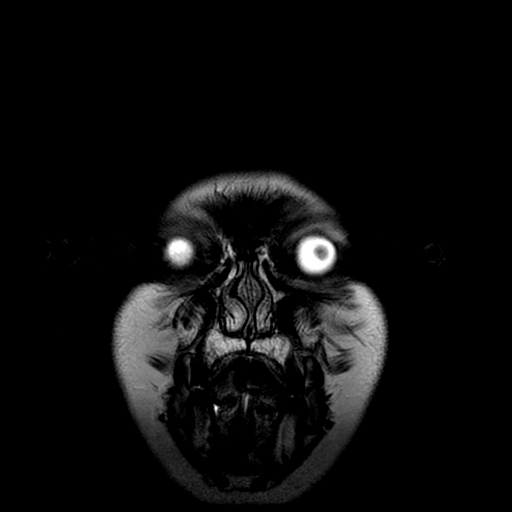
[im 13/26]
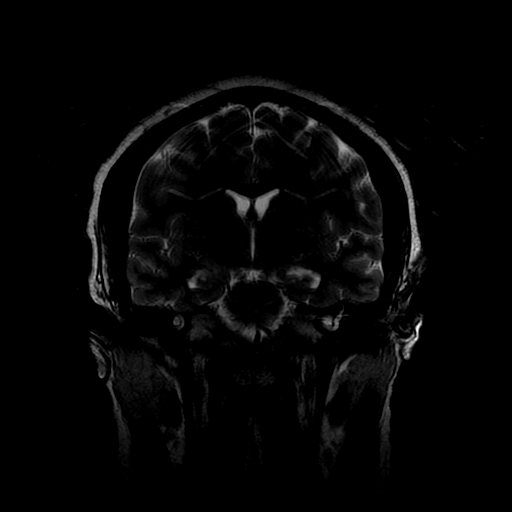
[im 26/26]
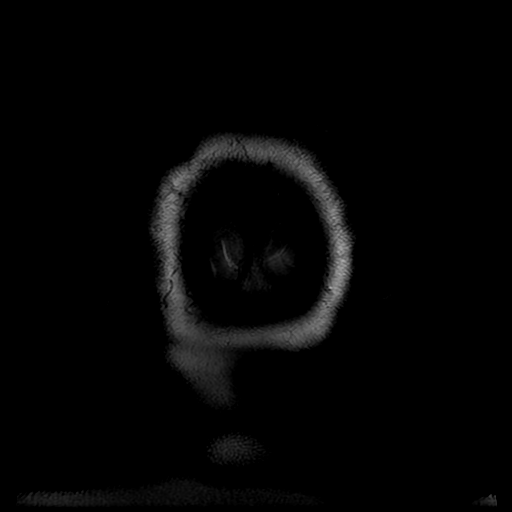

[Series 13: T1 post-contrast · coronal · 5.0mm · 0.45mm/px · 3 of 26 slices shown]
[im 1/26]
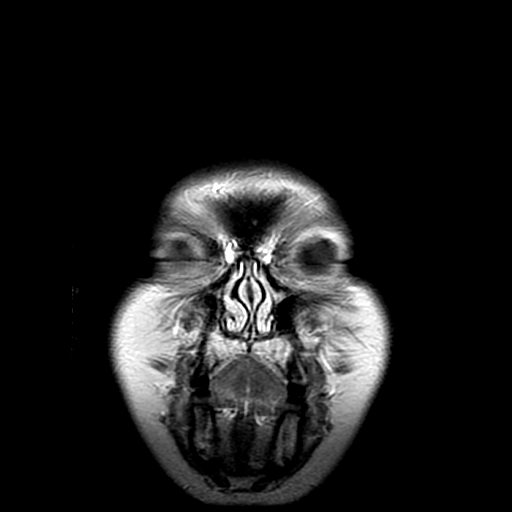
[im 13/26]
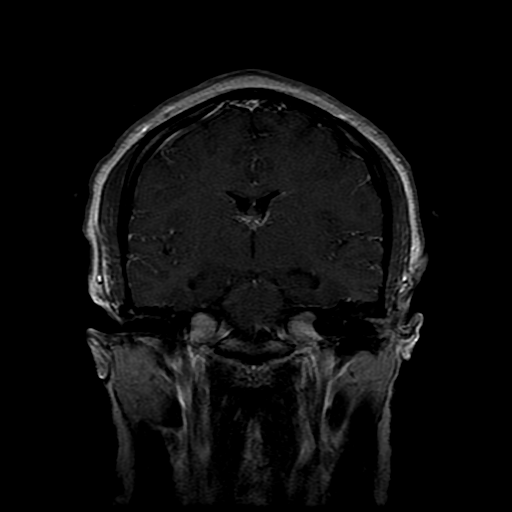
[im 26/26]
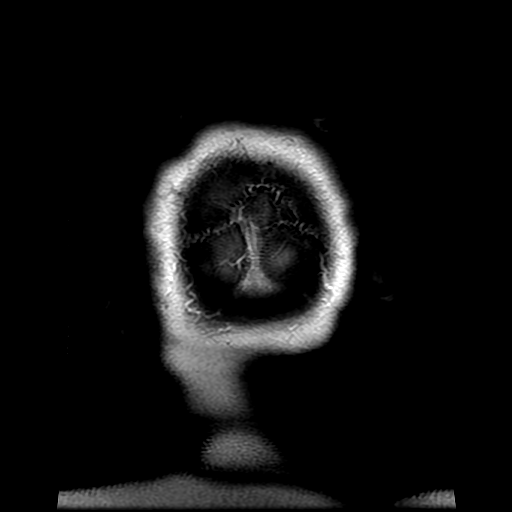

[Series 400: DWI · axial · 3.0mm · 1.09mm/px · z∈[-12,+126]mm · 5 of 47 slices shown (3 of 4)]
[im 1/47]
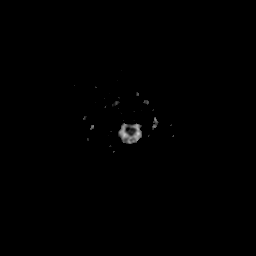
[im 12/47]
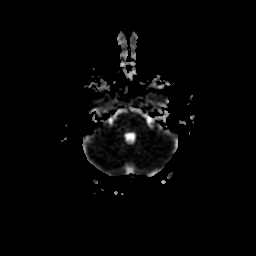
[im 24/47]
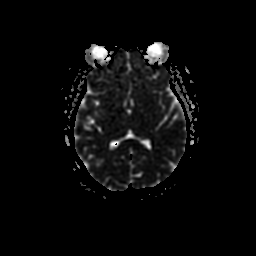
[im 35/47]
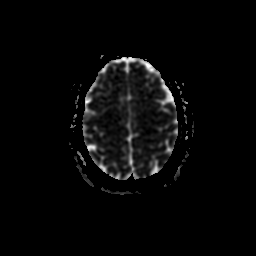
[im 47/47]
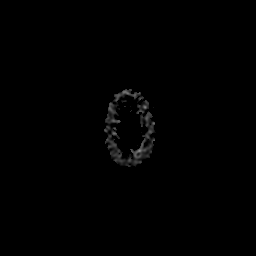

[Series 500: DWI · coronal · 5.0mm · 1.09mm/px · 3 of 35 slices shown (4 of 4)]
[im 1/35]
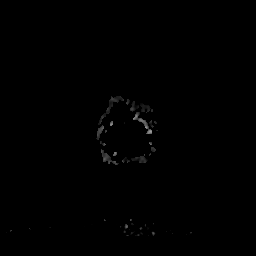
[im 18/35]
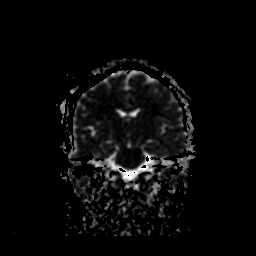
[im 35/35]
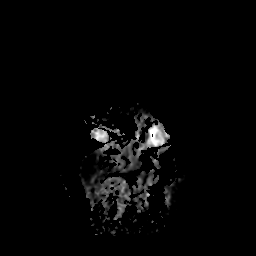

[36 of 48 positions shown; findings below may reference images not displayed]

FINDINGS: Mild motion artifact.

There is no acute infarct. Ventricles and sulci are normal for age.
There is no evidence of intracranial hemorrhage, mass, midline
shift, or extra-axial fluid collection. No brain parenchymal signal
abnormality or abnormal enhancement is identified.

Orbits are unremarkable. Paranasal sinuses are clear. There is trace
left mastoid fluid, decreased from prior. Major intracranial
vascular flow voids are preserved. Calvarium and scalp soft tissues
are unremarkable.
IMPRESSION: Unremarkable appearance of the brain.

## 2016-10-02 ENCOUNTER — Emergency Department (HOSPITAL_COMMUNITY)
Admission: EM | Admit: 2016-10-02 | Discharge: 2016-10-03 | Disposition: A | Discharge status: Other Acute Inpatient Hospital | Payer: Federal, State, Local not specified - Other | Attending: Emergency Medicine | Admitting: Emergency Medicine

## 2016-10-02 ENCOUNTER — Encounter (HOSPITAL_COMMUNITY): Payer: Self-pay

## 2016-10-02 DIAGNOSIS — F329 Major depressive disorder, single episode, unspecified: Secondary | ICD-10-CM | POA: Insufficient documentation

## 2016-10-02 DIAGNOSIS — Z79899 Other long term (current) drug therapy: Secondary | ICD-10-CM | POA: Insufficient documentation

## 2016-10-02 DIAGNOSIS — F4321 Adjustment disorder with depressed mood: Secondary | ICD-10-CM

## 2016-10-02 DIAGNOSIS — E119 Type 2 diabetes mellitus without complications: Secondary | ICD-10-CM | POA: Insufficient documentation

## 2016-10-02 DIAGNOSIS — R45851 Suicidal ideations: Secondary | ICD-10-CM | POA: Insufficient documentation

## 2016-10-02 LAB — RAPID URINE DRUG SCREEN, HOSP PERFORMED
Amphetamines: NOT DETECTED
BARBITURATES: NOT DETECTED
Benzodiazepines: NOT DETECTED
Cocaine: NOT DETECTED
Opiates: NOT DETECTED
Tetrahydrocannabinol: POSITIVE — AB

## 2016-10-02 LAB — CBC
HEMATOCRIT: 39 % (ref 36.0–46.0)
Hemoglobin: 13 g/dL (ref 12.0–15.0)
MCH: 28.1 pg (ref 26.0–34.0)
MCHC: 33.3 g/dL (ref 30.0–36.0)
MCV: 84.2 fL (ref 78.0–100.0)
Platelets: 304 10*3/uL (ref 150–400)
RBC: 4.63 MIL/uL (ref 3.87–5.11)
RDW: 13.3 % (ref 11.5–15.5)
WBC: 10.6 10*3/uL — ABNORMAL HIGH (ref 4.0–10.5)

## 2016-10-02 LAB — COMPREHENSIVE METABOLIC PANEL
ALBUMIN: 3.5 g/dL (ref 3.5–5.0)
ALK PHOS: 49 U/L (ref 38–126)
ALT: 19 U/L (ref 14–54)
AST: 18 U/L (ref 15–41)
Anion gap: 8 (ref 5–15)
BILIRUBIN TOTAL: 0.5 mg/dL (ref 0.3–1.2)
BUN: 14 mg/dL (ref 6–20)
CALCIUM: 8.9 mg/dL (ref 8.9–10.3)
CO2: 24 mmol/L (ref 22–32)
Chloride: 106 mmol/L (ref 101–111)
Creatinine, Ser: 0.85 mg/dL (ref 0.44–1.00)
GFR calc Af Amer: >60 mL/min (ref >60)
GFR calc non Af Amer: >60 mL/min (ref >60)
GLUCOSE: 90 mg/dL (ref 65–99)
Potassium: 4 mmol/L (ref 3.5–5.1)
Sodium: 138 mmol/L (ref 135–145)
TOTAL PROTEIN: 7 g/dL (ref 6.5–8.1)

## 2016-10-02 LAB — SALICYLATE LEVEL: Salicylate Lvl: <7 mg/dL (ref 2.8–30.0)

## 2016-10-02 LAB — I-STAT BETA HCG BLOOD, ED (MC, WL, AP ONLY)

## 2016-10-02 LAB — ETHANOL: Alcohol, Ethyl (B): <5 mg/dL (ref <5)

## 2016-10-02 LAB — CBG MONITORING, ED: GLUCOSE-CAPILLARY: 84 mg/dL (ref 65–99)

## 2016-10-02 LAB — ACETAMINOPHEN LEVEL: Acetaminophen (Tylenol), Serum: <10 ug/mL — ABNORMAL LOW (ref 10–30)

## 2016-10-02 MED ORDER — LORAZEPAM 1 MG PO TABS: 1.0000 mg | ORAL_TABLET | Freq: Three times a day (TID) | ORAL | Status: DC | PRN

## 2016-10-02 MED ORDER — CITALOPRAM HYDROBROMIDE 10 MG PO TABS: 20.0000 mg | ORAL_TABLET | Freq: Every day | ORAL | Status: DC

## 2016-10-02 MED ORDER — STERILE WATER FOR INJECTION IJ SOLN
INTRAMUSCULAR | Status: AC
  Administered 2016-10-02: 1.2 mL
  Filled 2016-10-02: qty 10

## 2016-10-02 MED ORDER — ACETAMINOPHEN 325 MG PO TABS: 650.0000 mg | ORAL_TABLET | ORAL | Status: DC | PRN

## 2016-10-02 MED ORDER — ZIPRASIDONE MESYLATE 20 MG IM SOLR
20.0000 mg | Freq: Once | INTRAMUSCULAR | Status: AC
Start: 2016-10-02 — End: 2016-10-02
  Administered 2016-10-02: 20 mg via INTRAMUSCULAR
  Filled 2016-10-02: qty 20

## 2016-10-02 MED ORDER — IBUPROFEN 200 MG PO TABS
600.0000 mg | ORAL_TABLET | Freq: Three times a day (TID) | ORAL | Status: DC | PRN
Start: 2016-10-02 — End: 2016-10-03

## 2016-10-02 MED ORDER — ONDANSETRON HCL 4 MG PO TABS: 4.0000 mg | ORAL_TABLET | Freq: Three times a day (TID) | ORAL | Status: DC | PRN

## 2016-10-02 NOTE — ED Notes (Signed)
Lab Tech advised will tube results.

## 2016-10-02 NOTE — ED Notes (Addendum)
Pt sitting on bed in room. Pt asking if she may use the phone. Advised pt yes if she agrees to be calm, cooperative, talks in calm voice, and will not attempt to leave ED inappropriately. Pt voiced agreement. Pt called her mother and returned to her room. Pt noted to be much calmer - tearful.

## 2016-10-02 NOTE — ED Notes (Signed)
[facility] advised [employer] on their way.

## 2016-10-02 NOTE — ED Notes (Signed)
TTS being performed. Mother w/pt.

## 2016-10-02 NOTE — ED Notes (Signed)
CBG 84. 

## 2016-10-02 NOTE — ED Notes (Signed)
Pt sleeping at present, no distress noted, calm, refusing to answer questions,. Awake, alert & responsive.  Monitoring for safety, Q 15 min checks in effect.

## 2016-10-02 NOTE — ED Notes (Signed)
Pt ambulatory to restroom to provide urine specimen.

## 2016-10-02 NOTE — ED Triage Notes (Signed)
Pt brought in by [employer] with report that the patients mom is at the Magistrate's office taking out IVC paperwork on the patient because she has made comments about wanting to kill herself due to some socioeconomic difficulties she is experiencing at this time. According to [employer], pt nodded her head when asked if she had a plan but did not tell them what the plan was. [employer] stated that the patient has been non-verbal with them.
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
## 2016-10-02 NOTE — ED Notes (Signed)
Pt lying on bed in room. Blankets given. Aware [employer] to transport her soon.

## 2016-10-02 NOTE — ED Notes (Signed)
Pt's mother aware pt to be transported to [facility]. Pt and RN spoke w/mother.

## 2016-10-02 NOTE — ED Notes (Signed)
Dr Goldston in w/pt. 

## 2016-10-02 NOTE — ED Notes (Signed)
Pt oriented to room and unit.  Pt denies S/I and H/I.  Pt was wanded by nurse as security never came.  Pt was checked for contraband.  Pt is calm and cooperative.  Dinner tray was given.

## 2016-10-02 NOTE — ED Notes (Signed)
[employer] transporting pt to [facility].

## 2016-10-02 NOTE — ED Notes (Addendum)
After TTS was completed, pt began to become upset d/t she and her mother were disagreeing and her mother was leaving. Pt ambulated out of ED - security and [employer] escorted pt back to room. Mother and sister present and aware of situation. Pt's belongings - 2 labeled belongings bags - given to pt's sister and mother. Pt yelling and cursing - standing in doorway of room - refusing to go into room and stand or sit. Dr Criss Alvine aware - attempted to speak w/pt. Pt continued to talk loudly and refusing to follow instructions. Offered pt po med - refused. Security and [employer] stood by. Order received for Geodon. Pt tolerated well - refused for injection to be given in hip - given right deltoid. Pt sitting in chair in room - playing w/soap dispenser - allowing soap to overflow into the floor. Pt refusing to stop. Soap and batteries removed. Floor cleaned. Pt then tearing up feminine wipes. Sitter noticed pt has her cell phone in her shirt pocket. Pt refused to give to staff. Gave to [employer] Officer. Phone placed in valuables envelope - noted w/cracked screen.

## 2016-10-02 NOTE — ED Provider Notes (Signed)
[facility]Y DEPT Provider Note   CSN: [id number] Arrival date & time: 10/02/16  1239     History   Chief Complaint Chief Complaint  Patient presents with  . Suicidal    HPI Felicia Holt is a 21 y.o. female.  HPI  21 year old female presents with depression and suicidal ideation. Brought in by police. Per them, the patient has been telling mom that she is suicidal with a plan to kill her self today. Her car was repossessed last night and they do not have funds to get it out today. Patient told police her life is "spinning out of control". Patient is very quiet and does not want to talk to me. However after gentle encouragement she tells me that she's been depressed for a long time and also has had suicidal thoughts for long time. Told her mom today she is suicidal and this is the first time she had said that. Has a plan to overdose on pills, not specifically what kind. Tells me she has taken pills as an overdose attempt several months ago but did not seek care. IVC paperwork by mom shows that she has had previous attempts with hanging.  Past Medical History:  Diagnosis Date  . GERD (gastroesophageal reflux disease)   . Gestational diabetes   . Morbid obesity [facility])     Patient Active Problem List   Diagnosis Date Noted  . Encephalopathy 03/01/2015  . Hypoxic encephalopathy (HCC) 02/01/2015  . Memory loss 02/01/2015  . Acute respiratory failure with hypoxia (HCC)   . Acute pulmonary edema (HCC)   . Pulmonary hypertension   . Acute pyelonephritis   . Acute kidney injury (HCC)   . Diabetes type 2, controlled (HCC)   . Acute encephalopathy   . Encephalopathy acute 01/04/2015  . Acute respiratory failure with hypoxemia (HCC)   . ARDS (adult respiratory distress syndrome) (HCC) 12/28/2014  . Acute renal disease   . Severe sepsis (HCC) 12/27/2014  . Pyelonephritis 12/26/2014  . Sepsis (HCC) 12/26/2014  . Hypotension 12/26/2014  . AKI (acute kidney injury) (HCC) 12/26/2014    . Abdominal pain 12/26/2014  . HELLP syndrome, delivered, current hospitalization 02/05/2014  . Postoperative anemia due to acute blood loss 02/04/2014  . Depression 11/25/2013  . Gestational diabetes 11/15/2013    Past Surgical History:  Procedure Laterality Date  . ADENOIDECTOMY    . CESAREAN SECTION N/A 02/03/2014   Procedure: CESAREAN SECTION;  Surgeon: Antionette Char, MD;  Location: [facility];  Service: Obstetrics;  Laterality: N/A;  . TONSILLECTOMY    . WISDOM TOOTH EXTRACTION      OB History    Gravida Para Term Preterm AB Living   SAB TAB Ectopic Multiple Live Births           1       Home Medications    Prior to Admission medications   Medication Sig Start Date End Date Taking? Authorizing Provider  citalopram (CELEXA) 10 MG tablet Take 2 tablets daily 03/01/15   Van Clines, MD  metFORMIN (GLUCOPHAGE) 850 MG tablet 850 mg. Take 1 tablet twice daily 01/12/15   Historical Provider, MD    Family History Family History  Problem Relation Age of Onset  . Hypertension Mother   . Depression Mother   . Hypertension Father   . Depression Father   . COPD Maternal Grandmother   . Hypertension Maternal Grandmother   . Cancer Maternal Grandmother   .  Depression Maternal Grandmother   . Learning disabilities Brother   . Early death Paternal Grandmother     Social History Social History  Substance Use Topics  . Smoking status: Never Smoker  . Smokeless tobacco: Never Used  . Alcohol use No     Allergies   Patient has no known allergies.   Review of Systems Review of Systems  Constitutional: Negative for fever.  Respiratory: Negative for cough.   Gastrointestinal: Negative for vomiting.  Neurological: Negative for headaches.  Psychiatric/Behavioral: Positive for suicidal ideas. Negative for self-injury.  All other systems reviewed and are negative.    Physical Exam Updated Vital Signs BP (!) 137/95 (BP Location: Right Arm)   Pulse 78    Temp 97.9 F (36.6 C) (Oral)   Resp 18   SpO2 100%   Physical Exam  Constitutional: She is oriented to person, place, and time. She appears well-developed and well-nourished. No distress.  obese  HENT:  Head: Normocephalic and atraumatic.  Right Ear: External ear normal.  Left Ear: External ear normal.  Nose: Nose normal.  Eyes: Right eye exhibits no discharge. Left eye exhibits no discharge.  Cardiovascular: Normal rate, regular rhythm and normal heart sounds.   Pulmonary/Chest: Effort normal and breath sounds normal.  Abdominal: Soft. There is no tenderness.  Neurological: She is alert and oriented to person, place, and time.  Skin: Skin is warm and dry. She is not diaphoretic.  Psychiatric: Her speech is delayed. She exhibits a depressed mood. She expresses suicidal ideation. She expresses suicidal plans.  Nursing note and vitals reviewed.    ED Treatments / Results  Labs (all labs ordered are listed, but only abnormal results are displayed) Labs Reviewed  ACETAMINOPHEN LEVEL - Abnormal; Notable for the following:       Result Value   Acetaminophen (Tylenol), Serum <10 (*)    All other components within normal limits  CBC - Abnormal; Notable for the following:    WBC 10.6 (*)    All other components within normal limits  RAPID URINE DRUG SCREEN, HOSP PERFORMED - Abnormal; Notable for the following:    Tetrahydrocannabinol POSITIVE (*)    All other components within normal limits  COMPREHENSIVE METABOLIC PANEL  ETHANOL  SALICYLATE LEVEL  I-STAT BETA HCG BLOOD, ED ([facility], [facility], [facility] ONLY)  CBG MONITORING, ED    EKG  EKG Interpretation None       Radiology No results found.  Procedures Procedures (including critical care time)  Medications Ordered in ED Medications  LORazepam (ATIVAN) tablet 1 mg (not administered)  acetaminophen (TYLENOL) tablet 650 mg (not administered)  ibuprofen (ADVIL,MOTRIN) tablet 600 mg (not administered)  ondansetron (ZOFRAN) tablet  4 mg (not administered)  ziprasidone (GEODON) injection 20 mg (20 mg Intramuscular Given 10/02/16 1500)  sterile water (preservative free) injection (1.2 mLs  Given 10/02/16 1500)     Initial Impression / Assessment and Plan / ED Course  I have reviewed the triage vital signs and the nursing notes.  Pertinent labs & imaging results that were available during my care of the patient were reviewed by me and considered in my medical decision making (see chart for details).  Clinical Course as of Oct 02 1609  Tue Oct 02, 2016  1332 Besides SI with a plan besides SI with a plan, she otherwise appears well. Vital signs unremarkable. Will send screening labs but currently appears well enough to go to psych hold and await TTS  [SG]  1452 Very agitated, trying to  leave. Unable to deescalate verbally, now fighting with officers. Will give IM geodon.  [SG]    Clinical Course User Index [SG] Pricilla Loveless, MD    Patient is calm her but still frustrated with the process. TTS has seen and recommends inpatient admission. They're looking for placement. During this time there is a spot available at [facility] long ED for her to be held. Discussed with EDP, Dr. Madilyn Hook, who accepts in transfer. Screening labs unremarkable besides very mild of WBC elevation of unclear etiology (doubt infection or systemic process)  Final Clinical Impressions(s) / ED Diagnoses   Final diagnoses:  Suicidal ideation    New Prescriptions New Prescriptions   No medications on file     Pricilla Loveless, MD 10/02/16 1612

## 2016-10-02 NOTE — ED Notes (Signed)
Dr Criss Alvine aware [facility] Psych ED RN and Charge RN advised will accept pt. Advised will call report after observing pt.

## 2016-10-02 NOTE — B[facility] Counselor (Signed)
Consult ordered at 1322. No provider note in yet. Writer spoke w/ pt's RN Shawna Orleans who will transport pt to [facility]. Writer to do teleassessment once provider's note is in.  Evette Cristal, [place] Therapeutic Triage Specialist

## 2016-10-02 NOTE — ED Notes (Signed)
Pepperoni pizza, french fries - ketchup, [place] Ice, and Sprite ordered for pt.

## 2016-10-02 NOTE — ED Notes (Signed)
Pt ambulatory to [facility] 8 w/mother and RN. Pt's belongings noted in 2 labeled belongings bags - mother to take - placed at desk for safety at this time. Pt noted to be irritated about being in ED. Pt follow instructions at this time. Pt denies SI/HI. Per report, pt refused Celexa.

## 2016-10-03 DIAGNOSIS — F4321 Adjustment disorder with depressed mood: Secondary | ICD-10-CM

## 2016-10-03 DIAGNOSIS — Z818 Family history of other mental and behavioral disorders: Secondary | ICD-10-CM | POA: Diagnosis not present

## 2016-10-03 NOTE — [facility] Suicide Risk Assessment (Signed)
Suicide Risk Assessment  Discharge Assessment   [facility] Discharge Suicide Risk Assessment   Principal Problem: Adjustment disorder with depressed mood Discharge Diagnoses:  Patient Active Problem List   Diagnosis Date Noted  . Adjustment disorder with depressed mood [F43.21] 10/03/2016    Priority: High  . Encephalopathy [G93.40] 03/01/2015  . Hypoxic encephalopathy (HCC) [G93.1] 02/01/2015  . Memory loss [R41.3] 02/01/2015  . Acute respiratory failure with hypoxia (HCC) [J96.01]   . Acute pulmonary edema (HCC) [J81.0]   . Pulmonary hypertension [I27.20]   . Acute pyelonephritis [N10]   . Acute kidney injury (HCC) [N17.9]   . Diabetes type 2, controlled (HCC) [E11.9]   . Acute encephalopathy [G93.40]   . Encephalopathy acute [G93.40] 01/04/2015  . Acute respiratory failure with hypoxemia (HCC) [J96.01]   . ARDS (adult respiratory distress syndrome) (HCC) [J80] 12/28/2014  . Acute renal disease [N28.9]   . Severe sepsis (HCC) [A41.9, R65.20] 12/27/2014  . Pyelonephritis [N12] 12/26/2014  . Sepsis (HCC) [A41.9] 12/26/2014  . Hypotension [I95.9] 12/26/2014  . AKI (acute kidney injury) (HCC) [N17.9] 12/26/2014  . Abdominal pain [R10.9] 12/26/2014  . HELLP syndrome, delivered, current hospitalization [O14.24] 02/05/2014  . Postoperative anemia due to acute blood loss [D62] 02/04/2014  . Depression [F32.9] 11/25/2013  . Gestational diabetes [O24.419] 11/15/2013    Total Time spent with patient: 45 minutes  Musculoskeletal: Strength & Muscle Tone: within normal limits Gait & Station: normal Patient leans: N/A  Psychiatric Specialty Exam: Physical Exam  Constitutional: She is oriented to person, place, and time. She appears well-developed and well-nourished.  HENT:  Head: Normocephalic.  Neck: Normal range of motion.  Respiratory: Effort normal.  Musculoskeletal: Normal range of motion.  Neurological: She is alert and oriented to person, place, and time.  Psychiatric: She has  a normal mood and affect. Her speech is normal and behavior is normal. Judgment and thought content normal. Cognition and memory are normal.    Review of Systems  All other systems reviewed and are negative.   Blood pressure 119/74, pulse 72, temperature 97.9 F (36.6 C), temperature source Oral, resp. rate 18, SpO2 100 %.There is no height or weight on file to calculate BMI.  General Appearance: Casual  Eye Contact:  Good  Speech:  Normal Rate  Volume:  Normal  Mood:  Euthymic  Affect:  Congruent  Thought Process:  Coherent and Descriptions of Associations: Intact  Orientation:  Full (Time, Place, and Person)  Thought Content:  WDL and Logical  Suicidal Thoughts:  No  Homicidal Thoughts:  No  Memory:  Immediate;   Good Recent;   Good Remote;   Good  Judgement:  Fair  Insight:  Fair  Psychomotor Activity:  Normal  Concentration:  Concentration: Good and Attention Span: Good  Recall:  Good  Fund of Knowledge:  Good  Language:  Good  Akathisia:  No  Handed:  Right  AIMS (if indicated):     Assets:  Housing Leisure Time Physical Health Resilience Social Support  ADL's:  Intact  Cognition:  WNL  Sleep:       Mental Status Per Nursing Assessment::   On Admission:   altercation with her mother and voiced suicidal ideations  Demographic Factors:  Adolescent or young adult  Loss Factors: Legal issues  Historical Factors: NA  Risk Reduction Factors:   Responsible for children under 63 years of age, Sense of responsibility to family, Living with another person, especially a relative and Positive social support  Continued Clinical Symptoms:  NOne  Cognitive Features That Contribute To Risk:  None    Suicide Risk:  Minimal: No identifiable suicidal ideation.  Patients presenting with no risk factors but with morbid ruminations; may be classified as minimal risk based on the severity of the depressive symptoms    Plan Of Care/Follow-up recommendations:   Activity:  as tolerated Diet:  heart healthy diet  LORD, JAMISON, NP 10/03/2016, 10:03 AM

## 2016-10-03 NOTE — ED Notes (Signed)
IVC has been rescinded per Tom CSW. 

## 2016-10-03 NOTE — ED Notes (Signed)
Pt denies si/hi/avh at this time.  Written dc instructions reviewed with patient.  Patient encouraged to follow up with [facility] and seek treatment for any return of si/hi/avh thoughts or urges.  Patient verbalized understanding and reported she would.

## 2016-10-03 NOTE — Discharge Instructions (Signed)
For your ongoing mental health needs, you are advised to follow up with [facility].  New and returning patients are seen at their walk-in clinic.  Walk-in hours are Monday - Friday from 8:00 am - 3:00 pm.  Walk-in patients are seen on a first come, first served basis.  Try to arrive as early as possible for he best chance of being seen the same day: ° °     [facility] °     [address] °     [place], [place] [address] °     [phone] °

## 2016-10-03 NOTE — ED Notes (Signed)
Catha Nottingham DNP talked with patients mother, patient to be dc'd this morning per DNP.

## 2016-10-03 NOTE — ED Notes (Signed)
Up to the bathroom 

## 2016-10-03 NOTE — BH Assessment (Signed)
[facility] Assessment Progress Note  Per Thedore Mins, MD, this pt does not require psychiatric hospitalization at this time.  Pt presents under IVC initiated by her mother, which Dr Jannifer Franklin has rescinded.  Pt is to be discharged from [facility] with recommendation to follow up with [facility].  This has been included in pt's discharge instructions.  Pt's nurse, Wille Celeste, has been notified.  Doylene Canning, MA Triage Specialist [phone]

## 2016-10-03 NOTE — Consult Note (Signed)
[facility] Consult   Reason for Consult:  Altercation with her mother and voiced suicidal ideations Referring Physician:  EDP Patient Identification: Felicia Holt MRN:  [id number] Principal Diagnosis: Adjustment disorder with depressed mood Diagnosis:   Patient Active Problem List   Diagnosis Date Noted  . Adjustment disorder with depressed mood [F43.21] 10/03/2016    Priority: High  . Encephalopathy [G93.40] 03/01/2015  . Hypoxic encephalopathy ([place]) [G93.1] 02/01/2015  . Memory loss [R41.3] 02/01/2015  . Acute respiratory failure with hypoxia ([place]) [J96.01]   . Acute pulmonary edema (HCC) [J81.0]   . Pulmonary hypertension [I27.20]   . Acute pyelonephritis [N10]   . Acute kidney injury ([place]) [N17.9]   . Diabetes type 2, controlled ([place]) [E11.9]   . Acute encephalopathy [G93.40]   . Encephalopathy acute [G93.40] 01/04/2015  . Acute respiratory failure with hypoxemia ([place]) [J96.01]   . ARDS (adult respiratory distress syndrome) ([place]) [J80] 12/28/2014  . Acute renal disease [N28.9]   . Severe sepsis ([place]) [A41.9, R65.20] 12/27/2014  . Pyelonephritis [N12] 12/26/2014  . Sepsis ([place]) [A41.9] 12/26/2014  . Hypotension [I95.9] 12/26/2014  . AKI (acute kidney injury) ([place]) [N17.9] 12/26/2014  . Abdominal pain [R10.9] 12/26/2014  . HELLP syndrome, delivered, current hospitalization [O14.24] 02/05/2014  . Postoperative anemia due to acute blood loss [D62] 02/04/2014  . Depression [F32.9] 11/25/2013  . Gestational diabetes [O24.419] 11/15/2013    Total Time spent with patient: 45 minutes  Subjective:   Felicia Holt is a 21 y.o. female patient does not warrant admission  HPI:  21 yo female who "said things I shouldn't have to my mom" during a verbal altercation.  Denies suicidal ideations or previous attempts, no homicidal ideations, hallucinations or alcohol/drug abuse.  She reports she was upset her car was reposed yesterday and then she and her mother argued.   Her mother was contacted with her permission and did not voice safety concerns, wanted her to be careful of what she said.  Mother will have someone come get her.  Past Psychiatric History: none  Risk to Self: Suicidal Ideation: No Suicidal Intent: No Is patient at risk for suicide?: Yes Suicidal Plan?: No What has been your use of drugs/alcohol within the last 12 months?: pt denies Intentional Self Injurious Behavior: None Risk to Others: Homicidal Ideation: No Thoughts of Harm to Others: No Current Homicidal Intent: No Current Homicidal Plan: No Access to Homicidal Means: No Identified Victim: none History of harm to others?: No Assessment of Violence: None Noted Violent Behavior Description: pt denies hx violence Does patient have access to weapons?: No Criminal Charges Pending?: Yes Describe Pending Criminal Charges: 2nd degree trespassing Does patient have a court date: Yes Court Date: 11/14/16 Prior Inpatient Therapy: Prior Inpatient Therapy: No Prior Outpatient Therapy: Prior Outpatient Therapy: Yes Prior Therapy Dates: last year Prior Therapy Facilty/Provider(s): [facility] Reason for Treatment: depression Does patient have an ACCT team?: No Does patient have Intensive In-House Services?  : No Does patient have [facility] services? : Unknown Does patient have [facility] services?: Unknown  Past Medical History:  Past Medical History:  Diagnosis Date  . GERD (gastroesophageal reflux disease)   . Gestational diabetes   . Morbid obesity ([place])     Past Surgical History:  Procedure Laterality Date  . ADENOIDECTOMY    . CESAREAN SECTION N/A 02/03/2014   Procedure: CESAREAN SECTION;  Surgeon: Lahoma Crocker, MD;  Location: [facility];  Service: Obstetrics;  Laterality: N/A;  . TONSILLECTOMY    . WISDOM TOOTH EXTRACTION  Family History:  Family History  Problem Relation Age of Onset  . Hypertension Mother   . Depression Mother   . Hypertension Father   . Depression Father    . COPD Maternal Grandmother   . Hypertension Maternal Grandmother   . Cancer Maternal Grandmother   . Depression Maternal Grandmother   . Learning disabilities Brother   . Early death Paternal Grandmother    Family Psychiatric  History: none Social History:  History  Alcohol Use No     History  Drug Use No    Social History   Social History  . Marital status: Single    Spouse name: N/A  . Number of children: 1  . Years of education: N/A   Social History Main Topics  . Smoking status: Never Smoker  . Smokeless tobacco: Never Used  . Alcohol use No  . Drug use: No  . Sexual activity: Not Currently    Partners: Male    Birth control/ protection: None   Other Topics Concern  . None   Social History Narrative  . None   Additional Social History:    Allergies:  No Known Allergies  Labs:  Results for orders placed or performed during the hospital encounter of 10/02/16 (from the past 48 hour(s))  Rapid urine drug screen (hospital performed)     Status: Abnormal   Collection Time: 10/02/16  1:37 PM  Result Value Ref Range   Opiates NONE DETECTED NONE DETECTED   Cocaine NONE DETECTED NONE DETECTED   Benzodiazepines NONE DETECTED NONE DETECTED   Amphetamines NONE DETECTED NONE DETECTED   Tetrahydrocannabinol POSITIVE (A) NONE DETECTED   Barbiturates NONE DETECTED NONE DETECTED    Comment:        DRUG SCREEN FOR MEDICAL PURPOSES ONLY.  IF CONFIRMATION IS NEEDED FOR ANY PURPOSE, NOTIFY LAB WITHIN 5 DAYS.        LOWEST DETECTABLE LIMITS FOR URINE DRUG SCREEN Drug Class       Cutoff (ng/mL) Amphetamine      1000 Barbiturate      200 Benzodiazepine   056 Tricyclics       979 Opiates          300 Cocaine          300 THC              50   I-Stat beta hCG blood, ED     Status: None   Collection Time: 10/02/16  1:57 PM  Result Value Ref Range   I-stat hCG, quantitative <5.0 <5 mIU/mL   Comment 3            Comment:   GEST. AGE      CONC.  (mIU/mL)   <=1  WEEK        5 - 50     2 WEEKS       50 - 500     3 WEEKS       100 - 10,000     4 WEEKS     1,000 - 30,000        FEMALE AND NON-PREGNANT FEMALE:     LESS THAN 5 mIU/mL   Comprehensive metabolic panel     Status: None   Collection Time: 10/02/16  2:01 PM  Result Value Ref Range   Sodium 138 135 - 145 mmol/L   Potassium 4.0 3.5 - 5.1 mmol/L   Chloride 106 101 - 111 mmol/L   CO2 24 22 - 32 mmol/L  Glucose, Bld 90 65 - 99 mg/dL   BUN 14 6 - 20 mg/dL   Creatinine, Ser 0.85 0.44 - 1.00 mg/dL   Calcium 8.9 8.9 - 10.3 mg/dL   Total Protein 7.0 6.5 - 8.1 g/dL   Albumin 3.5 3.5 - 5.0 g/dL   AST 18 15 - 41 U/L   ALT 19 14 - 54 U/L   Alkaline Phosphatase 49 38 - 126 U/L   Total Bilirubin 0.5 0.3 - 1.2 mg/dL   GFR calc non Af Amer >60 >60 mL/min   GFR calc Af Amer >60 >60 mL/min    Comment: (NOTE) The eGFR has been calculated using the CKD EPI equation. This calculation has not been validated in all clinical situations. eGFR's persistently <60 mL/min signify possible Chronic Kidney Disease.    Anion gap 8 5 - 15  cbc     Status: Abnormal   Collection Time: 10/02/16  2:01 PM  Result Value Ref Range   WBC 10.6 (H) 4.0 - 10.5 K/uL   RBC 4.63 3.87 - 5.11 MIL/uL   Hemoglobin 13.0 12.0 - 15.0 g/dL   HCT 39.0 36.0 - 46.0 %   MCV 84.2 78.0 - 100.0 fL   MCH 28.1 26.0 - 34.0 pg   MCHC 33.3 30.0 - 36.0 g/dL   RDW 13.3 11.5 - 15.5 %   Platelets 304 150 - 400 K/uL  Ethanol     Status: None   Collection Time: 10/02/16  2:02 PM  Result Value Ref Range   Alcohol, Ethyl (B) <5 <5 mg/dL    Comment:        LOWEST DETECTABLE LIMIT FOR SERUM ALCOHOL IS 5 mg/dL FOR MEDICAL PURPOSES ONLY   Salicylate level     Status: None   Collection Time: 10/02/16  2:02 PM  Result Value Ref Range   Salicylate Lvl <8.5 2.8 - 30.0 mg/dL  Acetaminophen level     Status: Abnormal   Collection Time: 10/02/16  2:02 PM  Result Value Ref Range   Acetaminophen (Tylenol), Serum <10 (L) 10 - 30 ug/mL    Comment:         THERAPEUTIC CONCENTRATIONS VARY SIGNIFICANTLY. A RANGE OF 10-30 ug/mL MAY BE AN EFFECTIVE CONCENTRATION FOR MANY PATIENTS. HOWEVER, SOME ARE BEST TREATED AT CONCENTRATIONS OUTSIDE THIS RANGE. ACETAMINOPHEN CONCENTRATIONS >150 ug/mL AT 4 HOURS AFTER INGESTION AND >50 ug/mL AT 12 HOURS AFTER INGESTION ARE OFTEN ASSOCIATED WITH TOXIC REACTIONS.   CBG monitoring, ED     Status: None   Collection Time: 10/02/16  2:11 PM  Result Value Ref Range   Glucose-Capillary 84 65 - 99 mg/dL    Current Facility-Administered Medications  Medication Dose Route Frequency Provider Last Rate Last Dose  . acetaminophen (TYLENOL) tablet 650 mg  650 mg Oral Q4H PRN Sherwood Gambler, MD      . ibuprofen (ADVIL,MOTRIN) tablet 600 mg  600 mg Oral Q8H PRN Sherwood Gambler, MD      . LORazepam (ATIVAN) tablet 1 mg  1 mg Oral Q8H PRN Sherwood Gambler, MD      . ondansetron (ZOFRAN) tablet 4 mg  4 mg Oral Q8H PRN Sherwood Gambler, MD       No current outpatient prescriptions on file.    Musculoskeletal: Strength & Muscle Tone: within normal limits Gait & Station: normal Patient leans: N/A  Psychiatric Specialty Exam: Physical Exam  Constitutional: She is oriented to person, place, and time. She appears well-developed and well-nourished.  HENT:  Head: Normocephalic.  Neck: Normal range of motion.  Respiratory: Effort normal.  Musculoskeletal: Normal range of motion.  Neurological: She is alert and oriented to person, place, and time.  Psychiatric: She has a normal mood and affect. Her speech is normal and behavior is normal. Judgment and thought content normal. Cognition and memory are normal.    Review of Systems  All other systems reviewed and are negative.   Blood pressure 119/74, pulse 72, temperature 97.9 F (36.6 C), temperature source Oral, resp. rate 18, SpO2 100 %.There is no height or weight on file to calculate BMI.  General Appearance: Casual  Eye Contact:  Good  Speech:  Normal Rate   Volume:  Normal  Mood:  Euthymic  Affect:  Congruent  Thought Process:  Coherent and Descriptions of Associations: Intact  Orientation:  Full (Time, Place, and Person)  Thought Content:  WDL and Logical  Suicidal Thoughts:  No  Homicidal Thoughts:  No  Memory:  Immediate;   Good Recent;   Good Remote;   Good  Judgement:  Fair  Insight:  Fair  Psychomotor Activity:  Normal  Concentration:  Concentration: Good and Attention Span: Good  Recall:  Good  Fund of Knowledge:  Good  Language:  Good  Akathisia:  No  Handed:  Right  AIMS (if indicated):     Assets:  Housing Leisure Time Physical Health Resilience Social Support  ADL's:  Intact  Cognition:  WNL  Sleep:        Treatment Plan Summary: Daily contact with patient to assess and evaluate symptoms and progress in treatment, Medication management and Plan adjustment disorder with depressed mood:  -Crisis stabilization -Medication management:  No medications started, not needed -Individual counseling  Disposition: No evidence of imminent risk to self or others at present.    Waylan Boga, NP 10/03/2016 10:00 AM  Patient seen face-to-face for psychiatric evaluation, chart reviewed and case discussed with the physician extender and developed treatment plan. Reviewed the information documented and agree with the treatment plan. Corena Pilgrim, MD

## 2016-10-03 NOTE — ED Notes (Signed)
On the phone 

## 2016-10-03 NOTE — ED Notes (Signed)
Pt's ride is on the way, Pt ambulatory to waiting room.  Pt has no belongings, shower shoes given.

## 2017-06-19 ENCOUNTER — Other Ambulatory Visit: Payer: Self-pay

## 2017-06-19 ENCOUNTER — Encounter (HOSPITAL_COMMUNITY): Payer: Self-pay | Admitting: Emergency Medicine

## 2017-06-19 DIAGNOSIS — B9689 Other specified bacterial agents as the cause of diseases classified elsewhere: Secondary | ICD-10-CM | POA: Insufficient documentation

## 2017-06-19 DIAGNOSIS — B373 Candidiasis of vulva and vagina: Secondary | ICD-10-CM | POA: Insufficient documentation

## 2017-06-19 DIAGNOSIS — N760 Acute vaginitis: Secondary | ICD-10-CM | POA: Insufficient documentation

## 2017-06-19 DIAGNOSIS — E119 Type 2 diabetes mellitus without complications: Secondary | ICD-10-CM | POA: Insufficient documentation

## 2017-06-19 NOTE — ED Triage Notes (Signed)
Pt c/o 8/10 vaginal pain, itchiness and burn for the past 2 weeks getting worse today, no fever or chills.

## 2017-06-20 ENCOUNTER — Emergency Department (HOSPITAL_COMMUNITY)
Admission: EM | Admit: 2017-06-20 | Discharge: 2017-06-20 | Disposition: A | Discharge status: Home / Self Care | Payer: Self-pay | Attending: Emergency Medicine | Admitting: Emergency Medicine

## 2017-06-20 DIAGNOSIS — B3731 Acute candidiasis of vulva and vagina: Secondary | ICD-10-CM

## 2017-06-20 DIAGNOSIS — B373 Candidiasis of vulva and vagina: Secondary | ICD-10-CM

## 2017-06-20 DIAGNOSIS — N760 Acute vaginitis: Secondary | ICD-10-CM

## 2017-06-20 DIAGNOSIS — B9689 Other specified bacterial agents as the cause of diseases classified elsewhere: Secondary | ICD-10-CM

## 2017-06-20 LAB — URINALYSIS, ROUTINE W REFLEX MICROSCOPIC
BILIRUBIN URINE: NEGATIVE
Glucose, UA: >=500 mg/dL — AB
Ketones, ur: 20 mg/dL — AB
NITRITE: NEGATIVE
PH: 6 (ref 5.0–8.0)
Protein, ur: NEGATIVE mg/dL
SPECIFIC GRAVITY, URINE: 1.037 — AB (ref 1.005–1.030)

## 2017-06-20 LAB — WET PREP, GENITAL
SPERM: NONE SEEN
Trich, Wet Prep: NONE SEEN

## 2017-06-20 LAB — GC/CHLAMYDIA PROBE AMP (~~LOC~~) NOT AT ARMC
Chlamydia: NEGATIVE
NEISSERIA GONORRHEA: NEGATIVE

## 2017-06-20 LAB — PREGNANCY, URINE: Preg Test, Ur: NEGATIVE

## 2017-06-20 MED ORDER — METRONIDAZOLE 500 MG PO TABS: 500.0000 mg | ORAL_TABLET | Freq: Two times a day (BID) | ORAL | 0 refills | Status: DC

## 2017-06-20 MED ORDER — FLUCONAZOLE 150 MG PO TABS: ORAL_TABLET | ORAL | 0 refills | Status: DC

## 2017-06-20 NOTE — Discharge Instructions (Signed)
Take the prescribed medication as directed. °Follow-up with your primary care doctor. °Return to the ED for new or worsening symptoms. °

## 2017-06-20 NOTE — ED Provider Notes (Signed)
[facility] Provider Note   CSN: [id number] Arrival date & time: 06/19/17  2259     History   Chief Complaint Chief Complaint  Patient presents with  . Vaginal Pain    HPI Felicia Holt is a 21 y.o. female.  The history is provided by the patient and medical records.     21 y.o. F with hx of morbid obesity, GERD, presenting to the ED for vaginal irritation.  States this has been ongoing for a few weeks.  She reports vaginal itching and burning.  She has noticed a little white discharge.  No new sexual partner or concern for STD.  No abdominal pain, pelvic pain, flank pain, fever, chills, sweats.  No dysuria.    Past Medical History:  Diagnosis Date  . GERD (gastroesophageal reflux disease)   . Gestational diabetes   . Morbid obesity [facility](HCC)     Patient Active Problem List   Diagnosis Date Noted  . Adjustment disorder with depressed mood 10/03/2016  . Encephalopathy 03/01/2015  . Hypoxic encephalopathy (HCC) 02/01/2015  . Memory loss 02/01/2015  . Acute respiratory failure with hypoxia (HCC)   . Acute pulmonary edema (HCC)   . Pulmonary hypertension (HCC)   . Acute pyelonephritis   . Acute kidney injury (HCC)   . Diabetes type 2, controlled (HCC)   . Acute encephalopathy   . Encephalopathy acute 01/04/2015  . Acute respiratory failure with hypoxemia (HCC)   . ARDS (adult respiratory distress syndrome) (HCC) 12/28/2014  . Acute renal disease   . Severe sepsis (HCC) 12/27/2014  . Pyelonephritis 12/26/2014  . Sepsis (HCC) 12/26/2014  . Hypotension 12/26/2014  . AKI (acute kidney injury) (HCC) 12/26/2014  . Abdominal pain 12/26/2014  . HELLP syndrome, delivered, current hospitalization 02/05/2014  . Postoperative anemia due to acute blood loss 02/04/2014  . Depression 11/25/2013  . Gestational diabetes 11/15/2013    Past Surgical History:  Procedure Laterality Date  . ADENOIDECTOMY    . CESAREAN SECTION N/A 02/03/2014   Procedure: CESAREAN SECTION;  Surgeon: Antionette CharLisa Mumme-Moore, MD;  Location: [facility];  Service: Obstetrics;  Laterality: N/A;  . TONSILLECTOMY    . WISDOM TOOTH EXTRACTION      OB History    Gravida Para Term Preterm AB Living   1 1 1     1    SAB TAB Ectopic Multiple Live Births           1       Home Medications    Prior to Admission medications   Not on File    Family History Family History  Problem Relation Age of Onset  . Hypertension Mother   . Depression Mother   . Hypertension Father   . Depression Father   . COPD Maternal Grandmother   . Hypertension Maternal Grandmother   . Cancer Maternal Grandmother   . Depression Maternal Grandmother   . Learning disabilities Brother   . Early death Paternal Grandmother     Social History Social History   Tobacco Use  . Smoking status: Never Smoker  . Smokeless tobacco: Never Used  Substance Use Topics  . Alcohol use: No    Alcohol/week: 0.0 oz  . Drug use: No     Allergies   Patient has no known allergies.   Review of Systems Review of Systems  Genitourinary: Positive for vaginal discharge and vaginal pain.  All other systems reviewed and are negative.    Physical Exam Updated Vital Signs BP  124/83 (BP Location: Right Arm)   Pulse 86   Temp 98.8 F (37.1 C) (Oral)   Resp 18   Ht 5\' 3"  (1.6 m)   Wt 104.3 kg (230 lb)   LMP 05/27/2017   SpO2 100%   BMI 40.74 kg/m   Physical Exam  Constitutional: She is oriented to person, place, and time. She appears well-developed and well-nourished.  HENT:  Head: Normocephalic and atraumatic.  Mouth/Throat: Oropharynx is clear and moist.  Eyes: Conjunctivae and EOM are normal. Pupils are equal, round, and reactive to light.  Neck: Normal range of motion.  Cardiovascular: Normal rate, regular rhythm and normal heart sounds.  Pulmonary/Chest: Effort normal and breath sounds normal.  Abdominal: Soft. Bowel sounds are normal.  Genitourinary:  Genitourinary  Comments: Exam chaperoned by RN Normal female external genitalia without visible lesion or rash; large amount of thick, curd-like vaginal discharge present; no adnexal or CMT  Musculoskeletal: Normal range of motion.  Neurological: She is alert and oriented to person, place, and time.  Skin: Skin is warm and dry.  Psychiatric: She has a normal mood and affect.  Nursing note and vitals reviewed.    ED Treatments / Results  Labs (all labs ordered are listed, but only abnormal results are displayed) Labs Reviewed  URINALYSIS, ROUTINE W REFLEX MICROSCOPIC - Abnormal; Notable for the following components:      Result Value   APPearance HAZY (*)    Specific Gravity, Urine 1.037 (*)    Glucose, UA >=500 (*)    Hgb urine dipstick SMALL (*)    Ketones, ur 20 (*)    Leukocytes, UA MODERATE (*)    Bacteria, UA RARE (*)    Squamous Epithelial / LPF 0-5 (*)    All other components within normal limits  WET PREP, GENITAL  PREGNANCY, URINE  GC/CHLAMYDIA PROBE AMP ([place]    EKG  EKG Interpretation None       Radiology No results found.  Procedures Procedures (including critical care time)  Medications Ordered in ED Medications - No data to display   Initial Impression / Assessment and Plan / ED Course  I have reviewed the triage vital signs and the nursing notes.  Pertinent labs & imaging results that were available during my care of the patient were reviewed by me and considered in my medical decision making (see chart for details).  21 year old female here with a few weeks of vaginal irritation.  Pelvic exam chaperoned by NT, most consistent with yeast infection.  There is no adnexal or cervical motion tenderness.  UA with moderate leuks but rare bacteria.  Wet prep with yeast and clue cells.  Will treat with Diflucan and Flagyl.  Gonorrhea and chlamydia swab pending, informed patient that she will be notified if this is abnormal.  She had no expressed concern  for STD.  Can follow-up with women's clinic if any ongoing issues.  Discussed plan with patient, she acknowledged understanding and agreed with plan of care.  Return precautions given for new or worsening symptoms.  Final Clinical Impressions(s) / ED Diagnoses   Final diagnoses:  BV (bacterial vaginosis)  Yeast vaginitis    ED Discharge Orders        Ordered    metroNIDAZOLE (FLAGYL) 500 MG tablet  2 times daily     06/20/17 0444    fluconazole (DIFLUCAN) 150 MG tablet     06/20/17 0444       Garlon HatchetSanders, Nyles Mitton M, PA-C 06/20/17  1610    Gilda Crease, MD 06/21/17 [id number]

## 2017-09-21 ENCOUNTER — Emergency Department (HOSPITAL_COMMUNITY)
Admission: EM | Admit: 2017-09-21 | Discharge: 2017-09-21 | Disposition: A | Discharge status: Home / Self Care | Payer: Self-pay | Attending: Emergency Medicine | Admitting: Emergency Medicine

## 2017-09-21 ENCOUNTER — Encounter (HOSPITAL_COMMUNITY): Payer: Self-pay | Admitting: Emergency Medicine

## 2017-09-21 ENCOUNTER — Other Ambulatory Visit: Payer: Self-pay

## 2017-09-21 DIAGNOSIS — N898 Other specified noninflammatory disorders of vagina: Secondary | ICD-10-CM

## 2017-09-21 DIAGNOSIS — E1165 Type 2 diabetes mellitus with hyperglycemia: Secondary | ICD-10-CM | POA: Insufficient documentation

## 2017-09-21 DIAGNOSIS — R1013 Epigastric pain: Secondary | ICD-10-CM

## 2017-09-21 DIAGNOSIS — N760 Acute vaginitis: Secondary | ICD-10-CM

## 2017-09-21 DIAGNOSIS — B9689 Other specified bacterial agents as the cause of diseases classified elsewhere: Secondary | ICD-10-CM | POA: Insufficient documentation

## 2017-09-21 DIAGNOSIS — R739 Hyperglycemia, unspecified: Secondary | ICD-10-CM

## 2017-09-21 LAB — WET PREP, GENITAL
SPERM: NONE SEEN
Trich, Wet Prep: NONE SEEN
YEAST WET PREP: NONE SEEN

## 2017-09-21 LAB — COMPREHENSIVE METABOLIC PANEL
ALBUMIN: 3.1 g/dL — AB (ref 3.5–5.0)
ALT: 15 U/L (ref 14–54)
AST: 18 U/L (ref 15–41)
Alkaline Phosphatase: 65 U/L (ref 38–126)
Anion gap: 8 (ref 5–15)
BILIRUBIN TOTAL: 0.4 mg/dL (ref 0.3–1.2)
BUN: 10 mg/dL (ref 6–20)
CHLORIDE: 102 mmol/L (ref 101–111)
CO2: 23 mmol/L (ref 22–32)
CREATININE: 0.91 mg/dL (ref 0.44–1.00)
Calcium: 8.6 mg/dL — ABNORMAL LOW (ref 8.9–10.3)
GFR calc Af Amer: >60 mL/min (ref >60)
GLUCOSE: 399 mg/dL — AB (ref 65–99)
POTASSIUM: 3.8 mmol/L (ref 3.5–5.1)
Sodium: 133 mmol/L — ABNORMAL LOW (ref 135–145)
TOTAL PROTEIN: 6.4 g/dL — AB (ref 6.5–8.1)

## 2017-09-21 LAB — CBC
HEMATOCRIT: 41.2 % (ref 36.0–46.0)
Hemoglobin: 13.6 g/dL (ref 12.0–15.0)
MCH: 28 pg (ref 26.0–34.0)
MCHC: 33 g/dL (ref 30.0–36.0)
MCV: 84.9 fL (ref 78.0–100.0)
PLATELETS: 272 10*3/uL (ref 150–400)
RBC: 4.85 MIL/uL (ref 3.87–5.11)
RDW: 12.7 % (ref 11.5–15.5)
WBC: 11.4 10*3/uL — ABNORMAL HIGH (ref 4.0–10.5)

## 2017-09-21 LAB — URINALYSIS, ROUTINE W REFLEX MICROSCOPIC
Bilirubin Urine: NEGATIVE
Glucose, UA: >=500 mg/dL — AB
Ketones, ur: NEGATIVE mg/dL
Nitrite: NEGATIVE
PROTEIN: NEGATIVE mg/dL
SPECIFIC GRAVITY, URINE: 1.032 — AB (ref 1.005–1.030)
pH: 6 (ref 5.0–8.0)

## 2017-09-21 LAB — I-STAT BETA HCG BLOOD, ED (MC, WL, AP ONLY)

## 2017-09-21 LAB — LIPASE, BLOOD: Lipase: 30 U/L (ref 11–51)

## 2017-09-21 MED ORDER — METRONIDAZOLE 500 MG PO TABS: 500.0000 mg | ORAL_TABLET | Freq: Two times a day (BID) | ORAL | 0 refills | Status: DC

## 2017-09-21 MED ORDER — SUCRALFATE 1 GM/10ML PO SUSP: 1.0000 g | Freq: Three times a day (TID) | ORAL | 0 refills | Status: DC

## 2017-09-21 NOTE — ED Provider Notes (Signed)
[facility] EMERGENCY DEPARTMENT Provider Note   CSN: [id number] Arrival date & time: 09/21/17  4098     History   Chief Complaint Chief Complaint  Patient presents with  . Abdominal Pain    HPI Felicia Holt is a 22 y.o. female with history of GERD is here for evaluation of epigastric abdominal discomfort described as "squeezing" intermittently stabbing for the last week. Pain is worse after eating foods that are "red" after eating dinner and at the end of the day. A couple of days ago she had pizza, noticed that she was burping more often and had regurgitation. She felt very nauseated but did not vomit. Has been taking ibuprofen and Aleve for abdominal pain, but feels like these worsen her pain.. States that smoking cigarettes makes her abdominal pain worse. She denies any fevers, chills, exertional chest pain or shortness of breath, hematemesis, changes in bowel movements. No previous history of abdominal surgeries other than cesarean sections.  Also reports vaginal itching, intermittent in burning with urination, feels like her vaginal skin is "raw". No significant change in discharge. States that she was in the emergency department for this several months ago and was given a couple of pills for a yeast infection and a bacterial infection that she took however her symptoms resolved temporarily and returned. She is sexually active with female partner's only, reportsconsistent 100% condom use during sexual encounters. However is unsure if she is pregnant. Unknown last menstrual period.  HPI  Past Medical History:  Diagnosis Date  . GERD (gastroesophageal reflux disease)   . Gestational diabetes   . Morbid obesity [facility])     Patient Active Problem List   Diagnosis Date Noted  . Adjustment disorder with depressed mood 10/03/2016  . Encephalopathy 03/01/2015  . Hypoxic encephalopathy (HCC) 02/01/2015  . Memory loss 02/01/2015  . Acute respiratory failure with hypoxia  (HCC)   . Acute pulmonary edema (HCC)   . Pulmonary hypertension (HCC)   . Acute pyelonephritis   . Acute kidney injury (HCC)   . Diabetes type 2, controlled (HCC)   . Acute encephalopathy   . Encephalopathy acute 01/04/2015  . Acute respiratory failure with hypoxemia (HCC)   . ARDS (adult respiratory distress syndrome) (HCC) 12/28/2014  . Acute renal disease   . Severe sepsis (HCC) 12/27/2014  . Pyelonephritis 12/26/2014  . Sepsis (HCC) 12/26/2014  . Hypotension 12/26/2014  . AKI (acute kidney injury) (HCC) 12/26/2014  . Abdominal pain 12/26/2014  . HELLP syndrome, delivered, current hospitalization 02/05/2014  . Postoperative anemia due to acute blood loss 02/04/2014  . Depression 11/25/2013  . Gestational diabetes 11/15/2013    Past Surgical History:  Procedure Laterality Date  . ADENOIDECTOMY    . CESAREAN SECTION N/A 02/03/2014   Procedure: CESAREAN SECTION;  Surgeon: Antionette Char, MD;  Location: [facility] ORS;  Service: Obstetrics;  Laterality: N/A;  . TONSILLECTOMY    . WISDOM TOOTH EXTRACTION       OB History    Gravida  1   Para  1   Term  1   Preterm      AB      Living  1     SAB      TAB      Ectopic      Multiple      Live Births  1            Home Medications    Prior to Admission medications   Medication Sig Start  Date End Date Taking? Authorizing Provider  fluconazole (DIFLUCAN) 150 MG tablet Take 1 tablet PO now.  Repeat in 72 hours if needed. 06/20/17   Garlon Hatchet, PA-C  metroNIDAZOLE (FLAGYL) 500 MG tablet Take 1 tablet (500 mg total) by mouth 2 (two) times daily. 09/21/17   Liberty Handy, PA-C  sucralfate (CARAFATE) 1 GM/10ML suspension Take 10 mLs (1 g total) by mouth 4 (four) times daily -  with meals and at bedtime. 09/21/17   Liberty Handy, PA-C    Family History Family History  Problem Relation Age of Onset  . Hypertension Mother   . Depression Mother   . Hypertension Father   . Depression Father   . COPD  Maternal Grandmother   . Hypertension Maternal Grandmother   . Cancer Maternal Grandmother   . Depression Maternal Grandmother   . Learning disabilities Brother   . Early death Paternal Grandmother     Social History Social History   Tobacco Use  . Smoking status: Never Smoker  . Smokeless tobacco: Never Used  Substance Use Topics  . Alcohol use: No    Alcohol/week: 0.0 oz  . Drug use: No     Allergies   Patient has no known allergies.   Review of Systems Review of Systems  Gastrointestinal: Positive for abdominal pain and nausea.       +regurgitation   Genitourinary: Positive for difficulty urinating, dysuria and vaginal discharge.  All other systems reviewed and are negative.    Physical Exam Updated Vital Signs BP 107/81   Pulse 90   Temp 98.3 F (36.8 C) (Oral)   Resp 18   Ht 5\' 3"  (1.6 m)   Wt 104.3 kg (230 lb)   SpO2 99%   BMI 40.74 kg/m   Physical Exam  Constitutional: She is oriented to person, place, and time. She appears well-developed and well-nourished. No distress.  Non toxic  HENT:  Head: Normocephalic and atraumatic.  Nose: Nose normal.  Mouth/Throat: No oropharyngeal exudate.  Moist mucous membranes   Eyes: Pupils are equal, round, and reactive to light. Conjunctivae and EOM are normal.  Neck: Normal range of motion.  Cardiovascular: Normal rate, regular rhythm and intact distal pulses.  No murmur heard. 2+ DP and radial pulses bilaterally. No LE edema.   Pulmonary/Chest: Effort normal and breath sounds normal. No respiratory distress. She has no wheezes. She has no rales.  Abdominal: Soft. Bowel sounds are normal. There is tenderness in the epigastric area.  No G/R/R. No suprapubic or CVA tenderness.   Genitourinary: Pelvic exam was performed with patient prone. There is tenderness on the right labia. There is tenderness on the left labia. There is erythema and tenderness in the vagina.  Genitourinary Comments: External genitalia and  labia majora with erythema and dryness, slightly tender with light touch. No groin lymphadenopathy.  Vaginal mucosa and cervix normal, pink with scant amount of clear white discharge or lesions.  Uterus in midline, smooth, not enlarged or tender. No CMT. Non palpable adnexa.  Musculoskeletal: Normal range of motion. She exhibits no deformity.  Neurological: She is alert and oriented to person, place, and time.  Skin: Skin is warm and dry. Capillary refill takes less than 2 seconds.  Psychiatric: She has a normal mood and affect. Her behavior is normal. Judgment and thought content normal.  Nursing note and vitals reviewed.    ED Treatments / Results  Labs (all labs ordered are listed, but only abnormal results are displayed) Labs  Reviewed  WET PREP, GENITAL - Abnormal; Notable for the following components:      Result Value   Clue Cells Wet Prep HPF POC PRESENT (*)    WBC, Wet Prep HPF POC FEW (*)    All other components within normal limits  COMPREHENSIVE METABOLIC PANEL - Abnormal; Notable for the following components:   Sodium 133 (*)    Glucose, Bld 399 (*)    Calcium 8.6 (*)    Total Protein 6.4 (*)    Albumin 3.1 (*)    All other components within normal limits  CBC - Abnormal; Notable for the following components:   WBC 11.4 (*)    All other components within normal limits  URINALYSIS, ROUTINE W REFLEX MICROSCOPIC - Abnormal; Notable for the following components:   APPearance CLOUDY (*)    Specific Gravity, Urine 1.032 (*)    Glucose, UA >=500 (*)    Hgb urine dipstick SMALL (*)    Leukocytes, UA SMALL (*)    Bacteria, UA RARE (*)    Squamous Epithelial / LPF TOO NUMEROUS TO COUNT (*)    All other components within normal limits  LIPASE, BLOOD  I-STAT BETA HCG BLOOD, ED ([facility], [facility], [facility] ONLY)  GC/CHLAMYDIA PROBE AMP ([place]) NOT AT [facility]    EKG None  Radiology No results found.  Procedures Procedures (including critical care time)  Medications Ordered in  ED Medications - No data to display   Initial Impression / Assessment and Plan / ED Course  I have reviewed the triage vital signs and the nursing notes.  Pertinent labs & imaging results that were available during my care of the patient were reviewed by me and considered in my medical decision making (see chart for details).  Clinical Course as of Sep 22 48  Sat Sep 21, 2017  2002 Glucose(!): >=500 [CG]  2002 Hgb urine dipstick(!): SMALL [CG]  2002 Leukocytes, UA(!): SMALL [CG]  2034 Glucose(!): 399 [CG]  2035 WBC(!): 11.4 [CG]  2035 Hgb urine dipstick(!): SMALL [CG]  2035 Leukocytes, UA(!): SMALL [CG]  2035 RBC / HPF: 6-30 [CG]  2035 WBC, UA: 6-30 [CG]  2250 Clue Cells Wet Prep HPF POC(!): PRESENT [CG]    Clinical Course User Index [CG] Liberty Handy, PA-C   22 year old here for epigastric abdominal pain was consistent with GERD/gastritis. Symptoms do not sound cardiac in nature, doubt ACS/MI in this young otherwise healthy patient. Exam as above is reassuring. No peritonitis. Negative Murphy's. Lipase is normal. Doubt pancreatitis, cholecystitis,dissection, AAA, esophageal rupture. Will address this with diet changes, omeprazole, famotidine, Carafate.   In regards to her vaginal symptoms based on my exam most consistent with dermatitis. She has dry, raw, erythematous,changes to her labia.  No vesicular lesions. Hyperglycemia today noted. Patient admits to history of gestational diabetes, discontinued her diabetic medications as told by her PCP when her blood glucose levels improved after pregnancy. Wet prep negative for yeast today, positive for clue cells although negative whiff test. I don't think bacterial vaginosis is causing her dysuria, itching or skin changes. We'll discharge with Flagyl. Also encouraged patient to use mild soap and water to clean genital area and avoid fragranced or irritating products. Aquaphor/Vaseline for symptom control. Encouraged her to follow-up  with PCP for further discussion of her hyperglycemia. She declined empiric STD treatment today. No CMT to raises suspicion for PID.  Final Clinical Impressions(s) / ED Diagnoses   Final diagnoses:  Epigastric abdominal pain  Hyperglycemia  Vaginal irritation  Bacterial vaginosis    ED Discharge Orders        Ordered    metroNIDAZOLE (FLAGYL) 500 MG tablet  2 times daily     09/21/17 2258    sucralfate (CARAFATE) 1 GM/10ML suspension  3 times daily with meals & bedtime     09/21/17 2300       Liberty HandyGibbons, Chrisanne Loose J, PA-C 09/22/17 0050    Rolan BuccoBelfi, Melanie, MD 09/22/17 1505

## 2017-09-21 NOTE — Discharge Instructions (Signed)
Your abdominal pain is likely from acid reflux. Take over the counter omeprazole 40 mg daily, famotidine 20 mg before every heavy/large meal and Carafate suspension 20-30 minutes before every meal. Avoid greasy, fatty, acidic foods, alcohol, ibuprofen, Aleve.  You have bacterial vaginosis, take Flagyl for this. Do not consume alcohol with Flagyl as it will cause severe nausea, vomiting and abdominal pain. Exam today revealed skin irritation, use water and non-fragranced soap to rinse. Apply thin layer of Vaseline/Aquaphor to the vaginal area. Your elevated blood sugar levels may be contributing. Follow up with your doctor for further discussion of your elevated blood sugar.  Your gonorrhea and chlamydia testing results are still pending, you will be notified via phone call in the next 24-48 hours if they are positive.

## 2017-09-21 NOTE — ED Triage Notes (Addendum)
Reports epigastric pressure with burping and nausea.  Reports period is a few days late unsure if pregnant.  Reports pain is worse when laying down at night especially after eating foods that are "red" like pizza or red juice.

## 2017-09-23 LAB — GC/CHLAMYDIA PROBE AMP (~~LOC~~) NOT AT ARMC
Chlamydia: POSITIVE — AB
NEISSERIA GONORRHEA: NEGATIVE

## 2017-09-25 ENCOUNTER — Other Ambulatory Visit (HOSPITAL_COMMUNITY)
Admission: RE | Admit: 2017-09-25 | Discharge: 2017-09-25 | Disposition: A | Discharge status: Home / Self Care | Payer: Medicaid Other | Source: Ambulatory Visit | Attending: Obstetrics | Admitting: Obstetrics

## 2017-09-25 ENCOUNTER — Encounter: Payer: Self-pay | Admitting: Obstetrics

## 2017-09-25 ENCOUNTER — Ambulatory Visit (INDEPENDENT_AMBULATORY_CARE_PROVIDER_SITE_OTHER): Payer: Medicaid Other | Admitting: Obstetrics

## 2017-09-25 ENCOUNTER — Telehealth: Payer: Self-pay | Admitting: Medical

## 2017-09-25 VITALS — BP 122/71 | HR 97 | Ht 63.0 in | Wt 230.4 lb

## 2017-09-25 DIAGNOSIS — Z309 Encounter for contraceptive management, unspecified: Secondary | ICD-10-CM

## 2017-09-25 DIAGNOSIS — Z01419 Encounter for gynecological examination (general) (routine) without abnormal findings: Secondary | ICD-10-CM | POA: Diagnosis not present

## 2017-09-25 DIAGNOSIS — Z3046 Encounter for surveillance of implantable subdermal contraceptive: Secondary | ICD-10-CM

## 2017-09-25 DIAGNOSIS — Z6841 Body Mass Index (BMI) 40.0 and over, adult: Secondary | ICD-10-CM

## 2017-09-25 DIAGNOSIS — A749 Chlamydial infection, unspecified: Secondary | ICD-10-CM

## 2017-09-25 LAB — POCT URINE PREGNANCY: PREG TEST UR: NEGATIVE

## 2017-09-25 MED ORDER — AZITHROMYCIN 250 MG PO TABS: 1000.0000 mg | ORAL_TABLET | Freq: Once | ORAL | 0 refills | Status: AC

## 2017-09-25 NOTE — Telephone Encounter (Addendum)
Donna ChristenJanai Newhall tested positive for  Chlamydia. Patient was called by RN and allergies and pharmacy confirmed. Rx sent to pharmacy of choice.   Marny LowensteinWenzel, Anmol Paschen N, PA-C 09/25/2017 2:04 PM      ----- Message from Kathe BectonLori S Berdik, RN sent at 09/25/2017  1:39 PM EDT ----- This patient tested positive for :  Chlamydia  She :"has NKDA", I have informed the patient of her results and confirmed her pharmacy is correct in her chart. Please send Rx.   Thank you,   Kathe BectonBerdik, Lori S, RN   [facility]

## 2017-09-25 NOTE — Telephone Encounter (Addendum)
Donna ChristenJanai Jaros tested positive for  Chlamydia. Patient was called by RN and allergies and pharmacy confirmed. Rx sent to pharmacy of choice.   Marny LowensteinWenzel, Julie N, PA-C 09/25/2017 3:28 PM      ----- Message from Kathe BectonLori S Berdik, RN sent at 09/25/2017  3:00 PM EDT ----- Raynelle FanningJulie, She has [facility] [address] listed. [phone] ----- Message ----- From: Kathlene CoteWenzel, Julie N, PA-C Sent: 09/25/2017   2:05 PM To: Kathe BectonLori S Berdik, RN  Lawson FiscalLori,   She doesn't have a pharmacy in her chart. Please call the patient to confirm where to send Rx and I will send it in.   Thanks,  Raynelle FanningJulie   ----- Message ----- From: Kathe BectonBerdik, Lori S, RN Sent: 09/25/2017   1:39 PM To: [facility] [employer]  This patient tested positive for :  Chlamydia  She :"has NKDA", I have informed the patient of her results and confirmed her pharmacy is correct in her chart. Please send Rx.   Thank you,   Kathe BectonBerdik, Lori S, RN   [facility]

## 2017-09-26 ENCOUNTER — Encounter: Payer: Self-pay | Admitting: Obstetrics

## 2017-09-26 NOTE — Progress Notes (Signed)
Subjective:        Felicia Holt is a 22 y.o. female here for a routine exam.  Current complaints: None.    Personal health questionnaire:  Is patient [name], have a family history of breast and/or ovarian cancer: no Is there a family history of uterine cancer diagnosed at age < 54, gastrointestinal cancer, urinary tract cancer, family member who is a Personnel officer syndrome-associated carrier: no Is the patient overweight and hypertensive, family history of diabetes, personal history of gestational diabetes, preeclampsia or PCOS: no Is patient over 28, have PCOS,  family history of premature CHD under age 22, diabetes, smoke, have hypertension or peripheral artery disease:  no At any time, has a partner hit, kicked or otherwise hurt or frightened you?: no Over the past 2 weeks, have you felt down, depressed or hopeless?: no Over the past 2 weeks, have you felt little interest or pleasure in doing things?:no   Gynecologic History Patient's last menstrual period was 09/02/2017. Contraception: Nexplanon Last Pap: unknown. Results were: unknown Last mammogram: n/a. Results were: n/a  Obstetric History OB History  Gravida Para Term Preterm AB Living  1 1 1     1   SAB TAB Ectopic Multiple Live Births          1    # Outcome Date GA Lbr Len/2nd Weight Sex Delivery Anes PTL Lv  1 Term 02/03/14 [redacted]w[redacted]d   M CS-LTranv EPI  LIV    Past Medical History:  Diagnosis Date  . GERD (gastroesophageal reflux disease)   . Gestational diabetes   . Morbid obesity (HCC)     Past Surgical History:  Procedure Laterality Date  . ADENOIDECTOMY    . CESAREAN SECTION N/A 02/03/2014   Procedure: CESAREAN SECTION;  Surgeon: Antionette Char, MD;  Location: [facility];  Service: Obstetrics;  Laterality: N/A;  . TONSILLECTOMY    . WISDOM TOOTH EXTRACTION       Current Outpatient Medications:  .  fluconazole (DIFLUCAN) 150 MG tablet, Take 1 tablet PO now.  Repeat in 72 hours if needed., Disp: 2  tablet, Rfl: 0 .  metroNIDAZOLE (FLAGYL) 500 MG tablet, Take 1 tablet (500 mg total) by mouth 2 (two) times daily., Disp: 14 tablet, Rfl: 0 .  sucralfate (CARAFATE) 1 GM/10ML suspension, Take 10 mLs (1 g total) by mouth 4 (four) times daily -  with meals and at bedtime., Disp: 420 mL, Rfl: 0 No Known Allergies  Social History   Tobacco Use  . Smoking status: Current Some Day Smoker    Types: Cigarettes    Last attempt to quit: 05/08/2013    Years since quitting: 4.3  . Smokeless tobacco: Never Used  Substance Use Topics  . Alcohol use: No    Alcohol/week: 0.0 oz    Family History  Problem Relation Age of Onset  . Hypertension Mother   . Depression Mother   . Hypertension Father   . Depression Father   . COPD Maternal Grandmother   . Hypertension Maternal Grandmother   . Cancer Maternal Grandmother   . Depression Maternal Grandmother   . Learning disabilities Brother   . Early death Paternal Grandmother       Review of Systems  Constitutional: negative for fatigue and weight loss Respiratory: negative for cough and wheezing Cardiovascular: negative for chest pain, fatigue and palpitations Gastrointestinal: negative for abdominal pain and change in bowel habits Musculoskeletal:negative for myalgias Neurological: negative for gait problems and tremors Behavioral/Psych: negative for abusive relationship, depression  Endocrine: negative for temperature intolerance    Genitourinary:negative for abnormal menstrual periods, genital lesions, hot flashes, sexual problems and vaginal discharge Integument/breast: negative for breast lump, breast tenderness, nipple discharge and skin lesion(s)    Objective:       BP 122/71   Pulse 97   Ht 5\' 3"  (1.6 m)   Wt 230 lb 6.4 oz (104.5 kg)   LMP 09/02/2017   BMI 40.81 kg/m  General:   alert  Skin:   no rash or abnormalities  Lungs:   clear to auscultation bilaterally  Heart:   regular rate and rhythm, S1, S2 normal, no murmur,  click, rub or gallop  Breasts:   normal without suspicious masses, skin or nipple changes or axillary nodes  Abdomen:  normal findings: no organomegaly, soft, non-tender and no hernia  Pelvis:  External genitalia: normal general appearance Urinary system: urethral meatus normal and bladder without fullness, nontender Vaginal: normal without tenderness, induration or masses Cervix: normal appearance Adnexa: normal bimanual exam Uterus: anteverted and non-tender, normal size   Lab Review Urine pregnancy test Labs reviewed yes Radiologic studies reviewed no  50% of 20 min visit spent on counseling and coordination of care.   Assessment:     1. Encounter for gynecological examination with Papanicolaou smear of cervix Rx: - Cytology - PAP  2. Nexplanon removal Rx: - POCT urine pregnancy  3. Class 3 severe obesity due to excess calories without serious comorbidity with body mass index (BMI) of 40.0 to 44.9 in adult [facility](HCC) - program of caloric reduction, exercise and behavioral modification recommended    Plan:    Education reviewed: calcium supplements, depression evaluation, low fat, low cholesterol diet, safe sex/STD prevention, self breast exams, smoking cessation and weight bearing exercise. Contraception: Undecided. Follow up in: 2 weeks.   No orders of the defined types were placed in this encounter.  Orders Placed This Encounter  Procedures  . POCT urine pregnancy    Brock BadHARLES A. Veronique Warga MD 09-25-2017    NEXPLANON REMOVAL NOTE  Date of LMP:   unknown  Contraception used: *Nexplanon   Indications:  The patient desires contraception.  She understands risks, benefits, and alternatives to Implanon and would like to proceed.  Anesthesia:   Lidocaine 1% plain.  Procedure:  A time-out was performed confirming the procedure and the patient's allergy status.  Complications: None                      The rod was palpated and the area was sterilely prepped.  The area  beneath the distal tip was anesthetized with 1% xylocaine and the skin incised                       Over the tip and the tip was exposed, grasped with forcep and removed intact.  Steri strip                       And a bandage applied and the arm was wrapped with gauze bandage.  The patient tolerated well.  Instructions:  The patient was instructed to remove the dressing in 24 hours and that some bruising is to be expected.  She was advised to use over the counter analgesics as needed for any pain at the site.  She is to keep the area dry for 24 hours and to call if her hand or arm becomes cold, numb, or blue.  Return visit:  Return in 2 weeks   Brock Bad MD 09-25-2017

## 2017-09-27 ENCOUNTER — Other Ambulatory Visit: Payer: Self-pay | Admitting: Obstetrics

## 2017-09-27 DIAGNOSIS — B373 Candidiasis of vulva and vagina: Secondary | ICD-10-CM

## 2017-09-27 DIAGNOSIS — B3731 Acute candidiasis of vulva and vagina: Secondary | ICD-10-CM

## 2017-09-27 LAB — CYTOLOGY - PAP
Adequacy: ABSENT
Diagnosis: NEGATIVE

## 2017-09-27 MED ORDER — FLUCONAZOLE 150 MG PO TABS: 150.0000 mg | ORAL_TABLET | Freq: Once | ORAL | 0 refills | Status: AC

## 2017-10-04 ENCOUNTER — Encounter: Payer: Self-pay | Admitting: *Deleted

## 2019-05-23 ENCOUNTER — Other Ambulatory Visit: Payer: Self-pay

## 2019-05-23 DIAGNOSIS — Z20822 Contact with and (suspected) exposure to covid-19: Secondary | ICD-10-CM

## 2019-05-25 LAB — NOVEL CORONAVIRUS, NAA: SARS-CoV-2, NAA: NOT DETECTED

## 2019-07-09 ENCOUNTER — Encounter: Payer: Self-pay | Admitting: Obstetrics

## 2019-07-09 ENCOUNTER — Other Ambulatory Visit: Payer: Self-pay

## 2019-07-09 ENCOUNTER — Ambulatory Visit (INDEPENDENT_AMBULATORY_CARE_PROVIDER_SITE_OTHER): Payer: Medicaid Other

## 2019-07-09 DIAGNOSIS — N926 Irregular menstruation, unspecified: Secondary | ICD-10-CM

## 2019-07-09 DIAGNOSIS — Z3201 Encounter for pregnancy test, result positive: Secondary | ICD-10-CM

## 2019-07-09 LAB — POCT URINE PREGNANCY: Preg Test, Ur: POSITIVE — AB

## 2019-07-09 NOTE — Progress Notes (Signed)
I have reviewed this chart and agree with the RN/CMA assessment and management.    K. Meryl Breslin Burklow, M.D. Attending [facility] (Faculty Practice)   

## 2019-07-09 NOTE — Progress Notes (Signed)
Felicia Holt presents today for UPT. She has no unusual complaints. LMP: 06/12/2019    OBJECTIVE: Appears well, in no apparent distress.  OB History    Gravida  2   Para  1   Term  1   Preterm      AB      Living  1     SAB      TAB      Ectopic      Multiple      Live Births  1          Home UPT Result: Positive  In-Office UPT result: Positive  I have reviewed the patient's medical, obstetrical, social, and family histories, and medications.   ASSESSMENT: Positive pregnancy test  PLAN Prenatal care to be completed at:  [facility]

## 2019-07-17 ENCOUNTER — Other Ambulatory Visit: Payer: Self-pay

## 2019-07-17 ENCOUNTER — Inpatient Hospital Stay (HOSPITAL_COMMUNITY): Payer: Medicaid Other

## 2019-07-17 ENCOUNTER — Encounter (HOSPITAL_COMMUNITY): Payer: Self-pay | Admitting: Obstetrics and Gynecology

## 2019-07-17 ENCOUNTER — Inpatient Hospital Stay (HOSPITAL_COMMUNITY)
Admission: AD | Admit: 2019-07-17 | Discharge: 2019-07-17 | Disposition: A | Discharge status: Home / Self Care | Payer: Medicaid Other | Attending: Obstetrics and Gynecology | Admitting: Obstetrics and Gynecology

## 2019-07-17 DIAGNOSIS — O99891 Other specified diseases and conditions complicating pregnancy: Secondary | ICD-10-CM | POA: Diagnosis not present

## 2019-07-17 DIAGNOSIS — F17210 Nicotine dependence, cigarettes, uncomplicated: Secondary | ICD-10-CM | POA: Diagnosis not present

## 2019-07-17 DIAGNOSIS — O3680X0 Pregnancy with inconclusive fetal viability, not applicable or unspecified: Secondary | ICD-10-CM

## 2019-07-17 DIAGNOSIS — O26899 Other specified pregnancy related conditions, unspecified trimester: Secondary | ICD-10-CM

## 2019-07-17 DIAGNOSIS — O99331 Smoking (tobacco) complicating pregnancy, first trimester: Secondary | ICD-10-CM | POA: Diagnosis not present

## 2019-07-17 DIAGNOSIS — O99211 Obesity complicating pregnancy, first trimester: Secondary | ICD-10-CM | POA: Insufficient documentation

## 2019-07-17 DIAGNOSIS — R1010 Upper abdominal pain, unspecified: Secondary | ICD-10-CM | POA: Insufficient documentation

## 2019-07-17 DIAGNOSIS — Z3A01 Less than 8 weeks gestation of pregnancy: Secondary | ICD-10-CM | POA: Diagnosis not present

## 2019-07-17 DIAGNOSIS — R109 Unspecified abdominal pain: Secondary | ICD-10-CM

## 2019-07-17 DIAGNOSIS — O26851 Spotting complicating pregnancy, first trimester: Secondary | ICD-10-CM | POA: Diagnosis not present

## 2019-07-17 DIAGNOSIS — O34219 Maternal care for unspecified type scar from previous cesarean delivery: Secondary | ICD-10-CM | POA: Diagnosis not present

## 2019-07-17 DIAGNOSIS — Z8632 Personal history of gestational diabetes: Secondary | ICD-10-CM | POA: Insufficient documentation

## 2019-07-17 DIAGNOSIS — Z809 Family history of malignant neoplasm, unspecified: Secondary | ICD-10-CM | POA: Diagnosis not present

## 2019-07-17 LAB — URINALYSIS, ROUTINE W REFLEX MICROSCOPIC
Bacteria, UA: NONE SEEN
Bilirubin Urine: NEGATIVE
Glucose, UA: >=500 mg/dL — AB
Hgb urine dipstick: NEGATIVE
Ketones, ur: NEGATIVE mg/dL
Leukocytes,Ua: NEGATIVE
Nitrite: NEGATIVE
Protein, ur: NEGATIVE mg/dL
Specific Gravity, Urine: 1.035 — ABNORMAL HIGH (ref 1.005–1.030)
pH: 6 (ref 5.0–8.0)

## 2019-07-17 LAB — CBC
HCT: 38.2 % (ref 36.0–46.0)
Hemoglobin: 13 g/dL (ref 12.0–15.0)
MCH: 28.7 pg (ref 26.0–34.0)
MCHC: 34 g/dL (ref 30.0–36.0)
MCV: 84.3 fL (ref 80.0–100.0)
Platelets: 274 10*3/uL (ref 150–400)
RBC: 4.53 MIL/uL (ref 3.87–5.11)
RDW: 12.5 % (ref 11.5–15.5)
WBC: 10.6 10*3/uL — ABNORMAL HIGH (ref 4.0–10.5)
nRBC: 0 % (ref 0.0–0.2)

## 2019-07-17 LAB — WET PREP, GENITAL
Clue Cells Wet Prep HPF POC: NONE SEEN
Sperm: NONE SEEN
Trich, Wet Prep: NONE SEEN
Yeast Wet Prep HPF POC: NONE SEEN

## 2019-07-17 LAB — HIV ANTIBODY (ROUTINE TESTING W REFLEX): HIV Screen 4th Generation wRfx: NONREACTIVE

## 2019-07-17 LAB — HCG, QUANTITATIVE, PREGNANCY: hCG, Beta Chain, Quant, S: 7772 m[IU]/mL — ABNORMAL HIGH (ref <5)

## 2019-07-17 MED ORDER — ACETAMINOPHEN 500 MG PO TABS: 1000.0000 mg | ORAL_TABLET | Freq: Once | ORAL | Status: DC

## 2019-07-17 NOTE — MAU Note (Signed)
PT SAYS WHEN SHE WIPED  AT 630PM- ON TOILET PAPER - SAW A DOT AND  THEN A STRING OF BLOOD.Marland Kitchen  LAST SEX- MON.  WENT TO [facility]- FOR PREG CONFIRMATION - ON 07-09-2019. FEELS LOWER AND UPPER  CRAMPS .

## 2019-07-17 NOTE — Discharge Instructions (Signed)

## 2019-07-17 NOTE — MAU Provider Note (Addendum)
History     CSN: [id number]  Arrival date and time: 07/17/19 0272   First Provider Initiated Contact with Patient 07/17/19 2015      Chief Complaint  Patient presents with  . Vaginal Bleeding   HPI  Ms.  Felicia Holt is a 24 y.o. year old G43P1001 female at [redacted]w[redacted]d weeks gestation who presents to MAU reporting seeing a "dot then a string of blood" when she wiped in the BR at ~ 1830 today and lower and upper abdominal cramping. Her last SI was Monday 07/13/19. She reports the abdominal pain/cramping as intermittent at an 8/10. She had a confirmation of pregnancy at [facility] on 07/09/19. She has a NOB appt scheduled there on 08/27/19.  Past Medical History:  Diagnosis Date  . GERD (gastroesophageal reflux disease)   . Gestational diabetes   . Morbid obesity (HCC)     Past Surgical History:  Procedure Laterality Date  . ADENOIDECTOMY    . CESAREAN SECTION N/A 02/03/2014   Procedure: CESAREAN SECTION;  Surgeon: Antionette Char, MD;  Location: [facility];  Service: Obstetrics;  Laterality: N/A;  . TONSILLECTOMY    . WISDOM TOOTH EXTRACTION      Family History  Problem Relation Age of Onset  . Hypertension Mother   . Depression Mother   . Hypertension Father   . Depression Father   . COPD Maternal Grandmother   . Hypertension Maternal Grandmother   . Cancer Maternal Grandmother   . Depression Maternal Grandmother   . Learning disabilities Brother   . Early death Paternal Grandmother     Social History   Tobacco Use  . Smoking status: Current Some Day Smoker    Types: Cigarettes    Last attempt to quit: 05/08/2013    Years since quitting: 6.1  . Smokeless tobacco: Never Used  Substance Use Topics  . Alcohol use: No    Alcohol/week: 0.0 standard drinks  . Drug use: Yes    Types: Marijuana    Comment: LAST SMOKED - LAST WEEKEND     Allergies: No Known Allergies  Medications Prior to Admission  Medication Sig Dispense Refill Last Dose  . fluconazole (DIFLUCAN) 150 MG  tablet Take 1 tablet PO now.  Repeat in 72 hours if needed. 2 tablet 0 More than a month at Unknown time  . metroNIDAZOLE (FLAGYL) 500 MG tablet Take 1 tablet (500 mg total) by mouth 2 (two) times daily. 14 tablet 0 More than a month at Unknown time  . sucralfate (CARAFATE) 1 GM/10ML suspension Take 10 mLs (1 g total) by mouth 4 (four) times daily -  with meals and at bedtime. 420 mL 0 More than a month at Unknown time    Review of Systems  Constitutional: Negative.   HENT: Negative.   Eyes: Negative.   Respiratory: Negative.   Cardiovascular: Negative.   Gastrointestinal: Negative.   Endocrine: Negative.   Genitourinary: Positive for pelvic pain ("worsening since laying down") and vaginal bleeding (spotting around 1830).  Musculoskeletal: Negative.   Skin: Negative.   Allergic/Immunologic: Negative.   Neurological: Negative.   Hematological: Negative.   Psychiatric/Behavioral: Negative.    Physical Exam   Blood pressure 117/62, pulse 76, temperature 98.4 F (36.9 C), resp. rate 20, height 5\' 2"  (1.575 m), weight 99.8 kg, last menstrual period 06/12/2019.  Physical Exam  Nursing note and vitals reviewed. Constitutional: She is oriented to person, place, and time. She appears well-developed and well-nourished.  HENT:  Head: Normocephalic and atraumatic.  Eyes: Pupils  are equal, round, and reactive to light.  Cardiovascular: Normal rate.  Respiratory: Effort normal.  GI: Soft.  Genitourinary:    Genitourinary Comments: Uterus: non-tender, SE: cervix is smooth, pink, no lesions, small amt of thick, white vaginal d/c -- WP, GC/CT done, closed/long/firm, no CMT or friability, moderate adnexal tenderness (R>L)  no vaginal bleeding or evidence of recent VB noted on SE.   Musculoskeletal:        General: Normal range of motion.     Cervical back: Normal range of motion.  Neurological: She is alert and oriented to person, place, and time.  Skin: Skin is warm and dry.  Psychiatric:  She has a normal mood and affect. Her behavior is normal. Judgment and thought content normal.    MAU Course  Procedures  MDM CCUA UPT CBC ABO/Rh -- not drawn, known AB Positive HCG Wet Prep GC/CT -- pending HIV -- pending OB < 14 wks [place] with TV Tylenol 1000 mg po --- med not given - patient opted to not take it, will take it when she gets home.  Results for orders placed or performed during the hospital encounter of 07/17/19 (from the past 24 hour(s))  Urinalysis, Routine w reflex microscopic     Status: Abnormal   Collection Time: 07/17/19  7:31 PM  Result Value Ref Range   Color, Urine STRAW (A) YELLOW   APPearance CLEAR CLEAR   Specific Gravity, Urine 1.035 (H) 1.005 - 1.030   pH 6.0 5.0 - 8.0   Glucose, UA >=500 (A) NEGATIVE mg/dL   Hgb urine dipstick NEGATIVE NEGATIVE   Bilirubin Urine NEGATIVE NEGATIVE   Ketones, ur NEGATIVE NEGATIVE mg/dL   Protein, ur NEGATIVE NEGATIVE mg/dL   Nitrite NEGATIVE NEGATIVE   Leukocytes,Ua NEGATIVE NEGATIVE   RBC / HPF 0-5 0 - 5 RBC/hpf   WBC, UA 0-5 0 - 5 WBC/hpf   Bacteria, UA NONE SEEN NONE SEEN   Squamous Epithelial / LPF 0-5 0 - 5  Wet prep, genital     Status: Abnormal   Collection Time: 07/17/19  8:30 PM  Result Value Ref Range   Yeast Wet Prep HPF POC NONE SEEN NONE SEEN   Trich, Wet Prep NONE SEEN NONE SEEN   Clue Cells Wet Prep HPF POC NONE SEEN NONE SEEN   WBC, Wet Prep HPF POC MODERATE (A) NONE SEEN   Sperm NONE SEEN   CBC     Status: Abnormal   Collection Time: 07/17/19  8:46 PM  Result Value Ref Range   WBC 10.6 (H) 4.0 - 10.5 K/uL   RBC 4.53 3.87 - 5.11 MIL/uL   Hemoglobin 13.0 12.0 - 15.0 g/dL   HCT 38.2 36.0 - 46.0 %   MCV 84.3 80.0 - 100.0 fL   MCH 28.7 26.0 - 34.0 pg   MCHC 34.0 30.0 - 36.0 g/dL   RDW 12.5 11.5 - 15.5 %   Platelets 274 150 - 400 K/uL   nRBC 0.0 0.0 - 0.2 %    US OB LESS THAN 14 WEEKS WITH OB TRANSVAGINAL  Result Date: 07/17/2019 CLINICAL DATA:  Vaginal spotting with pelvic pain,  initial encounter EXAM: OBSTETRIC <14 WK [place] AND TRANSVAGINAL OB US TECHNIQUE: Both transabdominal and transvaginal ultrasound examinations were performed for complete evaluation of the gestation as well as the maternal uterus, adnexal regions, and pelvic cul-de-sac. Transvaginal technique was performed to assess early pregnancy. COMPARISON:  None. FINDINGS: Intrauterine gestational sac: Present Yolk sac:  Absent MSD: 9.2 mm  5 w   5 d Subchorionic hemorrhage:  None visualized. Maternal uterus/adnexae: Within normal limits. IMPRESSION: Probable early intrauterine gestational sac, but no yolk sac, fetal pole, or cardiac activity yet visualized. Recommend follow-up quantitative B-HCG levels and follow-up US in 14 days to assess viability. This recommendation follows SRU consensus guidelines: Diagnostic Criteria for Nonviable Pregnancy Early in the First Trimester. Malva Limes Med 2013; 366:2947-65. Electronically Signed   By: Alcide Clever M.D.   On: 07/17/2019 21:24     Assessment and Plan  Pregnancy of unknown anatomic location  - Repeat HCG level on Sunday 07/19/19 after 6 pm - Information provided on ectopic pregnancy   Spotting affecting pregnancy in first trimester   - Information provided on VB in pregnancy  - Return to MAU:  If you have heavier bleeding that soaks through more that 2 pads per hour for an hour or more  If you bleed so much that you feel like you might pass out or you do pass out  If you have significant abdominal pain that is not improved with Tylenol   If you develop a fever > 100.5  Abdominal cramping affecting pregnancy - Advised to take Tylenol 1000 mg po every 6-8 hours prn pain - Information provided on abd pain in preg   - Discharge patient - Return to MAU on Sunday 07/19/19 for rpt HCG - Patient verbalized an understanding of the plan of care and agrees.    Raelyn Mora, MSN, CNM 07/17/2019, 8:16 PM

## 2019-07-20 LAB — GC/CHLAMYDIA PROBE AMP (~~LOC~~) NOT AT ARMC
Chlamydia: NEGATIVE
Comment: NEGATIVE
Comment: NORMAL
Neisseria Gonorrhea: NEGATIVE

## 2019-08-17 ENCOUNTER — Telehealth: Payer: Self-pay

## 2019-08-17 NOTE — Telephone Encounter (Signed)
Return call to pt regarding message  Pt called with c/o increased vaginal bleeding Pt has NOB app soon  Pt states she went to ER last month for vaginal bleeding. Pt advised to go to hospital for an evaluation.  Pt voiced understanding.

## 2019-08-18 ENCOUNTER — Inpatient Hospital Stay (HOSPITAL_COMMUNITY): Payer: Medicaid Other

## 2019-08-18 ENCOUNTER — Other Ambulatory Visit: Payer: Self-pay

## 2019-08-18 ENCOUNTER — Inpatient Hospital Stay (HOSPITAL_COMMUNITY)
Admission: AD | Admit: 2019-08-18 | Discharge: 2019-08-18 | Disposition: A | Discharge status: Home / Self Care | Payer: Medicaid Other | Attending: Obstetrics and Gynecology | Admitting: Obstetrics and Gynecology

## 2019-08-18 ENCOUNTER — Encounter (HOSPITAL_COMMUNITY): Payer: Self-pay | Admitting: Obstetrics and Gynecology

## 2019-08-18 DIAGNOSIS — Z8632 Personal history of gestational diabetes: Secondary | ICD-10-CM | POA: Insufficient documentation

## 2019-08-18 DIAGNOSIS — O26891 Other specified pregnancy related conditions, first trimester: Secondary | ICD-10-CM | POA: Diagnosis not present

## 2019-08-18 DIAGNOSIS — Z809 Family history of malignant neoplasm, unspecified: Secondary | ICD-10-CM | POA: Insufficient documentation

## 2019-08-18 DIAGNOSIS — Z3A09 9 weeks gestation of pregnancy: Secondary | ICD-10-CM | POA: Diagnosis not present

## 2019-08-18 DIAGNOSIS — Z79899 Other long term (current) drug therapy: Secondary | ICD-10-CM | POA: Diagnosis not present

## 2019-08-18 DIAGNOSIS — O4690 Antepartum hemorrhage, unspecified, unspecified trimester: Secondary | ICD-10-CM

## 2019-08-18 DIAGNOSIS — O021 Missed abortion: Secondary | ICD-10-CM | POA: Diagnosis present

## 2019-08-18 DIAGNOSIS — O99212 Obesity complicating pregnancy, second trimester: Secondary | ICD-10-CM | POA: Diagnosis not present

## 2019-08-18 DIAGNOSIS — O99331 Smoking (tobacco) complicating pregnancy, first trimester: Secondary | ICD-10-CM | POA: Diagnosis not present

## 2019-08-18 DIAGNOSIS — Z6790 Unspecified blood type, Rh positive: Secondary | ICD-10-CM | POA: Diagnosis not present

## 2019-08-18 DIAGNOSIS — O34219 Maternal care for unspecified type scar from previous cesarean delivery: Secondary | ICD-10-CM | POA: Diagnosis not present

## 2019-08-18 DIAGNOSIS — F17210 Nicotine dependence, cigarettes, uncomplicated: Secondary | ICD-10-CM | POA: Insufficient documentation

## 2019-08-18 LAB — COMPREHENSIVE METABOLIC PANEL
ALT: 20 U/L (ref 0–44)
AST: 14 U/L — ABNORMAL LOW (ref 15–41)
Albumin: 3 g/dL — ABNORMAL LOW (ref 3.5–5.0)
Alkaline Phosphatase: 46 U/L (ref 38–126)
Anion gap: 10 (ref 5–15)
BUN: 7 mg/dL (ref 6–20)
CO2: 24 mmol/L (ref 22–32)
Calcium: 8.8 mg/dL — ABNORMAL LOW (ref 8.9–10.3)
Chloride: 102 mmol/L (ref 98–111)
Creatinine, Ser: 0.78 mg/dL (ref 0.44–1.00)
GFR calc Af Amer: >60 mL/min (ref >60)
GFR calc non Af Amer: >60 mL/min (ref >60)
Glucose, Bld: 306 mg/dL — ABNORMAL HIGH (ref 70–99)
Potassium: 4.1 mmol/L (ref 3.5–5.1)
Sodium: 136 mmol/L (ref 135–145)
Total Bilirubin: 0.5 mg/dL (ref 0.3–1.2)
Total Protein: 6.1 g/dL — ABNORMAL LOW (ref 6.5–8.1)

## 2019-08-18 LAB — URINALYSIS, ROUTINE W REFLEX MICROSCOPIC
Bilirubin Urine: NEGATIVE
Glucose, UA: >=500 mg/dL — AB
Ketones, ur: NEGATIVE mg/dL
Leukocytes,Ua: NEGATIVE
Nitrite: NEGATIVE
Protein, ur: 30 mg/dL — AB
Specific Gravity, Urine: 1.037 — ABNORMAL HIGH (ref 1.005–1.030)
pH: 6 (ref 5.0–8.0)

## 2019-08-18 LAB — WET PREP, GENITAL
Clue Cells Wet Prep HPF POC: NONE SEEN
Sperm: NONE SEEN
Trich, Wet Prep: NONE SEEN
Yeast Wet Prep HPF POC: NONE SEEN

## 2019-08-18 LAB — CBC
HCT: 40.2 % (ref 36.0–46.0)
Hemoglobin: 13.6 g/dL (ref 12.0–15.0)
MCH: 29.2 pg (ref 26.0–34.0)
MCHC: 33.8 g/dL (ref 30.0–36.0)
MCV: 86.5 fL (ref 80.0–100.0)
Platelets: 279 10*3/uL (ref 150–400)
RBC: 4.65 MIL/uL (ref 3.87–5.11)
RDW: 11.9 % (ref 11.5–15.5)
WBC: 7.5 10*3/uL (ref 4.0–10.5)
nRBC: 0 % (ref 0.0–0.2)

## 2019-08-18 LAB — HCG, QUANTITATIVE, PREGNANCY: hCG, Beta Chain, Quant, S: 10723 m[IU]/mL — ABNORMAL HIGH (ref <5)

## 2019-08-18 MED ORDER — OXYCODONE HCL 5 MG PO TABS: 5.0000 mg | ORAL_TABLET | Freq: Four times a day (QID) | ORAL | 0 refills | Status: AC | PRN

## 2019-08-18 MED ORDER — IBUPROFEN 600 MG PO TABS: 600.0000 mg | ORAL_TABLET | Freq: Four times a day (QID) | ORAL | 0 refills | Status: AC | PRN

## 2019-08-18 MED ORDER — MISOPROSTOL 200 MCG PO TABS: 800.0000 ug | ORAL_TABLET | Freq: Once | ORAL | 0 refills | Status: AC

## 2019-08-18 MED ORDER — PROMETHAZINE HCL 12.5 MG PO TABS: 12.5000 mg | ORAL_TABLET | Freq: Four times a day (QID) | ORAL | 0 refills | Status: AC | PRN

## 2019-08-18 NOTE — MAU Note (Signed)
Sunday night, started seeing blood every time she wiped. Was seen a month ago for the same thing, unknown cause.  No pain.

## 2019-08-18 NOTE — MAU Provider Note (Signed)
History     CSN: [id number]  Arrival date and time: 08/18/19 1042   First Provider Initiated Contact with Patient 08/18/19 1200      Chief Complaint  Patient presents with  . Vaginal Bleeding   [name] is a 24 y.o. G2P1001 at [redacted]w[redacted]d who presents to MAU for vaginal bleeding which began Sunday 08/16/2019. Pt describes bleeding as only present when wiping after using the restroom. Denies any pain, bleeding in toilet or bleeding in underwear.  Passing blood clots? no Blood soaking clothes? no Lightheaded/dizzy? no Significant pelvic pain or cramping? no Passed any tissue? no  Current pregnancy problems? pt has not yet been seen Blood Type? AB Positive Allergies? NKDA Current medications? Tylenol PRN Current PNC & next appt? [facility], NOB 08/27/2019  Pt denies vaginal discharge/odor/itching. Pt denies N/V, abdominal pain, constipation, diarrhea, or urinary problems. Pt denies fever, chills, fatigue, sweating or changes in appetite. Pt denies SOB or chest pain. Pt denies dizziness, HA, light-headedness, weakness.   OB History    Gravida  2   Para  1   Term  1   Preterm      AB      Living  1     SAB      TAB      Ectopic      Multiple      Live Births  1           Past Medical History:  Diagnosis Date  . GERD (gastroesophageal reflux disease)   . Gestational diabetes   . Morbid obesity (HCC)     Past Surgical History:  Procedure Laterality Date  . ADENOIDECTOMY    . CESAREAN SECTION N/A 02/03/2014   Procedure: CESAREAN SECTION;  Surgeon: Antionette Char, MD;  Location: [facility];  Service: Obstetrics;  Laterality: N/A;  . TONSILLECTOMY    . WISDOM TOOTH EXTRACTION      Family History  Problem Relation Age of Onset  . Hypertension Mother   . Depression Mother   . Hypertension Father   . Depression Father   . COPD Maternal Grandmother   . Hypertension Maternal Grandmother   . Cancer Maternal Grandmother   . Depression Maternal  Grandmother   . Learning disabilities Brother   . Early death Paternal Grandmother     Social History   Tobacco Use  . Smoking status: Current Some Day Smoker    Types: Cigarettes    Last attempt to quit: 05/08/2013    Years since quitting: 6.2  . Smokeless tobacco: Never Used  Substance Use Topics  . Alcohol use: No    Alcohol/week: 0.0 standard drinks  . Drug use: Yes    Types: Marijuana    Comment: LAST SMOKED - LAST WEEKEND     Allergies: No Known Allergies  No medications prior to admission.    Review of Systems  Constitutional: Negative for chills, diaphoresis, fatigue and fever.  Eyes: Negative for visual disturbance.  Respiratory: Negative for shortness of breath.   Cardiovascular: Negative for chest pain.  Gastrointestinal: Negative for abdominal pain, constipation, diarrhea, nausea and vomiting.  Genitourinary: Positive for vaginal bleeding. Negative for dysuria, flank pain, frequency, pelvic pain, urgency and vaginal discharge.  Neurological: Negative for dizziness, weakness, light-headedness and headaches.   Physical Exam   Blood pressure 125/69, pulse 73, temperature 98.5 F (36.9 C), temperature source Oral, resp. rate 18, height 5\' 2"  (1.575 m), weight 107 kg, last menstrual period 06/12/2019, SpO2 100 %.  Patient Vitals  for the past 24 hrs:  BP Temp Temp src Pulse Resp SpO2 Height Weight  08/18/19 1051 125/69 98.5 F (36.9 C) Oral 73 18 100 % 5\' 2"  (1.575 m) 107 kg   Physical Exam  Constitutional: She is oriented to person, place, and time. She appears well-developed and well-nourished. No distress.  HENT:  Head: Normocephalic and atraumatic.  Respiratory: Effort normal.  GI: Soft.  Genitourinary: There is no rash, tenderness or lesion on the right labia. There is no rash, tenderness or lesion on the left labia. Cervix exhibits no motion tenderness, no discharge and no friability. Right adnexum displays no mass, no tenderness and no fullness. Left  adnexum displays no mass, no tenderness and no fullness.    Vaginal bleeding (minimal-moderate bleeding present in vaginal vault, minimal active bleeding from external os.) present.     No vaginal discharge or tenderness.  There is bleeding (minimal-moderate bleeding present in vaginal vault, minimal active bleeding from external os.) in the vagina. No tenderness in the vagina.  Neurological: She is alert and oriented to person, place, and time.  Skin: Skin is warm and dry. She is not diaphoretic.  Psychiatric: She has a normal mood and affect. Her behavior is normal. Judgment and thought content normal.   Results for orders placed or performed during the hospital encounter of 08/18/19 (from the past 24 hour(s))  Urinalysis, Routine w reflex microscopic     Status: Abnormal   Collection Time: 08/18/19 11:06 AM  Result Value Ref Range   Color, Urine YELLOW YELLOW   APPearance HAZY (A) CLEAR   Specific Gravity, Urine 1.037 (H) 1.005 - 1.030   pH 6.0 5.0 - 8.0   Glucose, UA >=500 (A) NEGATIVE mg/dL   Hgb urine dipstick LARGE (A) NEGATIVE   Bilirubin Urine NEGATIVE NEGATIVE   Ketones, ur NEGATIVE NEGATIVE mg/dL   Protein, ur 30 (A) NEGATIVE mg/dL   Nitrite NEGATIVE NEGATIVE   Leukocytes,Ua NEGATIVE NEGATIVE   RBC / HPF 21-50 0 - 5 RBC/hpf   WBC, UA 21-50 0 - 5 WBC/hpf   Bacteria, UA RARE (A) NONE SEEN   Squamous Epithelial / LPF 11-20 0 - 5   Mucus PRESENT   hCG, quantitative, pregnancy     Status: Abnormal   Collection Time: 08/18/19 12:11 PM  Result Value Ref Range   hCG, Beta Chain, Quant, S 10,723 (H) <5 mIU/mL  CBC     Status: None   Collection Time: 08/18/19 12:25 PM  Result Value Ref Range   WBC 7.5 4.0 - 10.5 K/uL   RBC 4.65 3.87 - 5.11 MIL/uL   Hemoglobin 13.6 12.0 - 15.0 g/dL   HCT 40.2 36.0 - 46.0 %   MCV 86.5 80.0 - 100.0 fL   MCH 29.2 26.0 - 34.0 pg   MCHC 33.8 30.0 - 36.0 g/dL   RDW 11.9 11.5 - 15.5 %   Platelets 279 150 - 400 K/uL   nRBC 0.0 0.0 - 0.2 %   Comprehensive metabolic panel     Status: Abnormal   Collection Time: 08/18/19 12:25 PM  Result Value Ref Range   Sodium 136 135 - 145 mmol/L   Potassium 4.1 3.5 - 5.1 mmol/L   Chloride 102 98 - 111 mmol/L   CO2 24 22 - 32 mmol/L   Glucose, Bld 306 (H) 70 - 99 mg/dL   BUN 7 6 - 20 mg/dL   Creatinine, Ser 0.78 0.44 - 1.00 mg/dL   Calcium 8.8 (L) 8.9 - 10.3  mg/dL   Total Protein 6.1 (L) 6.5 - 8.1 g/dL   Albumin 3.0 (L) 3.5 - 5.0 g/dL   AST 14 (L) 15 - 41 U/L   ALT 20 0 - 44 U/L   Alkaline Phosphatase 46 38 - 126 U/L   Total Bilirubin 0.5 0.3 - 1.2 mg/dL   GFR calc non Af Amer >60 >60 mL/min   GFR calc Af Amer >60 >60 mL/min   Anion gap 10 5 - 15  Wet prep, genital     Status: Abnormal   Collection Time: 08/18/19 12:36 PM  Result Value Ref Range   Yeast Wet Prep HPF POC NONE SEEN NONE SEEN   Trich, Wet Prep NONE SEEN NONE SEEN   Clue Cells Wet Prep HPF POC NONE SEEN NONE SEEN   WBC, Wet Prep HPF POC MODERATE (A) NONE SEEN   Sperm NONE SEEN    US OB LESS THAN 14 WEEKS WITH OB TRANSVAGINAL  Result Date: 08/18/2019 CLINICAL DATA:  Vaginal bleeding EXAM: OBSTETRIC <14 WK [place] AND TRANSVAGINAL OB US TECHNIQUE: Both transabdominal and transvaginal ultrasound examinations were performed for complete evaluation of the gestation as well as the maternal uterus, adnexal regions, and pelvic cul-de-sac. Transvaginal technique was performed to assess early pregnancy. COMPARISON:  07/17/2019 FINDINGS: Intrauterine gestational sac: Single Yolk sac:  Not visualized Embryo:  Not visualized Cardiac Activity: Not visualized Heart Rate:   bpm MSD: 20.2 mm   6 w   6 d CRL:    mm    w    d                  [place] EDC: Subchorionic hemorrhage:  None visualized. Maternal uterus/adnexae: No adnexal mass or free fluid. IMPRESSION: Six week 6 day intrauterine pregnancy based on mean sac diameter. No yolk sac or fetal pole. Given the lack of embryo approximately 1 month following prior scan that showed a gestational sac,  findings meet definitive criteria for failed pregnancy. This follows SRU consensus guidelines: Diagnostic Criteria for Nonviable Pregnancy Early in the First Trimester. Macy Mis J Med [phone]. Electronically Signed   By: Charlett Nose M.D.   On: 08/18/2019 15:02    MAU Course  Procedures  MDM -r/o ectopic -UA: hazy/SG 1.037/>/=500GLU/lg hgb/30PRO -CBC: WNL -CMP: glucose 306, otherwise WNL -[place]: failed pregnancy -hCG: 10,723 -ABO: AB Positive -WetPrep: WNL -GC/CT collected -consulted with Dr. Vergie Living given patient's significant medical event about 5 years ago and uncontrolled Type II DM. Per Dr. Vergie Living, do not need to treat elevated glucose at this time in MAU and patient is eligible for Cytotec.  Early Intrauterine Pregnancy Failure Protocol X  Documented intrauterine pregnancy failure less than or equal to [redacted] weeks gestation ([redacted]w[redacted]d by MSD) X  No serious current illness (significant hospital event 5 years ago without lasting effects, uncontrolled Type II DM) X  Baseline Hgb greater than or equal to 10g/dl (78.4)  X  Patient has easily accessible transportation to the hospital  X  Clear preference  X  Practitioner/physician deems patient reliable  X  Counseling by practitioner or physician  X  Patient education by RN  X   Rho-Gam n/a (AB Positive)  X  Cytotec 800 mcg, RX sent to pharmacy, buccal administration discussed  X   Ibuprofen 600 mg 1 tablet by mouth every 6 hours as needed - prescribed  X   Oxycodone 5mg  1-2 tabs every 6 hours as needed - prescribed  X   Phenergan 25 mg by  mouth every 6 hours as needed for nausea - prescribed  Reviewed with pt cytotec procedure.  Pt verbalizes that she lives close to the hospital and has transportation readily available.  Pt appears reliable and verbalizes understanding and agrees with plan of care.  -pt discharged to home in stable condition  Orders Placed This Encounter  Procedures  . Wet prep, genital    Standing Status:    Standing    Number of Occurrences:   1  . US OB LESS THAN 14 WEEKS WITH OB TRANSVAGINAL    Standing Status:   Standing    Number of Occurrences:   1    Order Specific Question:   Symptom/Reason for Exam    Answer:   Vaginal bleeding in pregnancy [705036]  . Urinalysis, Routine w reflex microscopic    Standing Status:   Standing    Number of Occurrences:   1  . CBC    Standing Status:   Standing    Number of Occurrences:   1  . Comprehensive metabolic panel    Standing Status:   Standing    Number of Occurrences:   1  . hCG, quantitative, pregnancy    Standing Status:   Standing    Number of Occurrences:   1  . Discharge patient    Order Specific Question:   Discharge disposition    Answer:   01-Home or Self Care [1]    Order Specific Question:   Discharge patient date    Answer:   08/18/2019    Assessment and Plan   1. Missed abortion   2. Vaginal bleeding in pregnancy   3. Blood type, Rh positive    Allergies as of 08/18/2019   No Known Allergies     Medication List    TAKE these medications   ibuprofen 600 MG tablet Commonly known as: ADVIL Take 1 tablet (600 mg total) by mouth every 6 (six) hours as needed for up to 30 doses for moderate pain or cramping.   misoprostol 200 MCG tablet Commonly known as: Cytotec Take 4 tablets (800 mcg total) by mouth once for 1 dose.   oxyCODONE 5 MG immediate release tablet Commonly known as: Roxicodone Take 1 tablet (5 mg total) by mouth every 6 (six) hours as needed.   promethazine 12.5 MG tablet Commonly known as: PHENERGAN Take 1 tablet (12.5 mg total) by mouth every 6 (six) hours as needed for nausea or vomiting.       -discussed buccal administration of cytotec at home -discussed pt should call office if no s/sx of miscarriage after 24-48hrs post-cytotec administration -message sent to [facility] to schedule f/u appts for SAB and to schedule with [facility] to f/u on diabetes -discussed s/sx of  miscarriage and normal expectations -strict bleeding/pain/return MAU precautions given -pt discharged to home in stable condition  Joni Reining E Liberato Stansbery 08/18/2019, 4:10 PM

## 2019-08-18 NOTE — Discharge Instructions (Signed)
Miscarriage A miscarriage is the loss of an unborn baby (fetus) before the 20th week of pregnancy. Most miscarriages happen during the first 3 months of pregnancy. Sometimes, a miscarriage can happen before a woman knows that she is pregnant. Having a miscarriage can be an emotional experience. If you have had a miscarriage, talk with your health care provider about any questions you may have about miscarrying, the grieving process, and your plans for future pregnancy. What are the causes? A miscarriage may be caused by:  Problems with the genes or chromosomes of the fetus. These problems make it impossible for the baby to develop normally. They are often the result of random errors that occur early in the development of the baby, and are not passed from parent to child (not inherited).  Infection of the cervix or uterus.  Conditions that affect hormone balance in the body.  Problems with the cervix, such as the cervix opening and thinning before pregnancy is at term (cervical insufficiency).  Problems with the uterus. These may include: ? A uterus with an abnormal shape. ? Fibroids in the uterus. ? Congenital abnormalities. These are problems that were present at birth.  Certain medical conditions.  Smoking, drinking alcohol, or using drugs.  Injury (trauma). In many cases, the cause of a miscarriage is not known. What are the signs or symptoms? Symptoms of this condition include:  Vaginal bleeding or spotting, with or without cramps or pain.  Pain or cramping in the abdomen or lower back.  Passing fluid, tissue, or blood clots from the vagina. How is this diagnosed? This condition may be diagnosed based on:  A physical exam.  Ultrasound.  Blood tests.  Urine tests. How is this treated? Treatment for a miscarriage is sometimes not necessary if you naturally pass all the tissue that was in your uterus. If necessary, this condition may be treated with:  Dilation and  curettage (D&C). This is a procedure in which the cervix is stretched open and the lining of the uterus (endometrium) is scraped. This is done only if tissue from the fetus or placenta remains in the body (incomplete miscarriage).  Medicines, such as: ? Antibiotic medicine, to treat infection. ? Medicine to help the body pass any remaining tissue. ? Medicine to reduce (contract) the size of the uterus. These medicines may be given if you have a lot of bleeding. If you have Rh negative blood and your baby was Rh positive, you will need a shot of a medicine called Rh immunoglobulinto protect your future babies from Rh blood problems. "Rh-negative" and "Rh-positive" refer to whether or not the blood has a specific protein found on the surface of red blood cells (Rh factor). Follow these instructions at home: Medicines   Take over-the-counter and prescription medicines only as told by your health care provider.  If you were prescribed antibiotic medicine, take it as told by your health care provider. Do not stop taking the antibiotic even if you start to feel better.  Do not take NSAIDs, such as aspirin and ibuprofen, unless they are approved by your health care provider. These medicines can cause bleeding. Activity  Rest as directed. Ask your health care provider what activities are safe for you.  Have someone help with home and family responsibilities during this time. General instructions  Keep track of the number of sanitary pads you use each day and how soaked (saturated) they are. Write down this information.  Monitor the amount of tissue or blood clots that   you pass from your vagina. Save any large amounts of tissue for your health care provider to examine.  Do not use tampons, douche, or have sex until your health care provider approves.  To help you and your partner with the process of grieving, talk with your health care provider or seek counseling.  When you are ready, meet with  your health care provider to discuss any important steps you should take for your health. Also, discuss steps you should take to have a healthy pregnancy in the future.  Keep all follow-up visits as told by your health care provider. This is important. Where to find more information  The American Congress of Obstetricians and Gynecologists: www.acog.org  [place] Department of Health and Human Services Office of Women's Health: www.womenshealth.gov Contact a health care provider if:  You have a fever or chills.  You have a foul smelling vaginal discharge.  You have more bleeding instead of less. Get help right away if:  You have severe cramps or pain in your back or abdomen.  You pass blood clots or tissue from your vagina that is walnut-sized or larger.  You soak more than 1 regular sanitary pad in an hour.  You become light-headed or weak.  You pass out.  You have feelings of sadness that take over your thoughts, or you have thoughts of hurting yourself. Summary  Most miscarriages happen in the first 3 months of pregnancy. Sometimes miscarriage happens before a woman even knows that she is pregnant.  Follow your health care provider's instruction for home care. Keep all follow-up appointments.  To help you and your partner with the process of grieving, talk with your health care provider or seek counseling. This information is not intended to replace advice given to you by your health care provider. Make sure you discuss any questions you have with your health care provider. Document Revised: 10/10/2018 Document Reviewed: 07/24/2016 Elsevier Patient Education  2020 Elsevier Inc.   Managing Pregnancy Loss Pregnancy loss can happen any time during a pregnancy. Often the cause is not known. It is rarely because of anything you did. Pregnancy loss in early pregnancy (during the first trimester) is called a miscarriage. This type of pregnancy loss is the most common. Pregnancy loss  that happens after 20 weeks of pregnancy is called fetal demise if the baby's heart stops beating before birth. Fetal demise is much less common. Some women experience spontaneous labor shortly after fetal demise resulting in a stillborn birth (stillbirth). Any pregnancy loss can be devastating. You will need to recover both physically and emotionally. Most women are able to get pregnant again after a pregnancy loss and deliver a healthy baby. How to manage emotional recovery  Pregnancy loss is very hard emotionally. You may feel many different emotions while you grieve. You may feel sad and angry. You may also feel guilty. It is normal to have periods of crying. Emotional recovery can take longer than physical recovery. It is different for everyone. Taking these steps can help you in managing this loss:  Remember that it is unlikely you did anything to cause the pregnancy loss.  Share your thoughts and feelings with friends, family, and your partner. Remember that your partner is also recovering emotionally.  Make sure you have a good support system. Do not spend too much time alone.  Meet with a pregnancy loss counselor or join a pregnancy loss support group.  Get enough sleep and eat a healthy diet. Return to regular exercise   when you have recovered physically.  Do not use drugs or alcohol to manage your emotions.  Consider seeing a mental health professional to help you recover emotionally.  Ask a friend or loved one to help you decide what to do with any clothing and nursery items you received for your baby. In the case of a stillbirth, many women benefit from taking additional steps in the grieving process. You may want to:  Hold your baby after the birth.  Name your baby.  Request a birth certificate.  Create a keepsake such as handprints or footprints.  Dress your baby and have a picture taken.  Make funeral arrangements.  Ask for a baptism or blessing. Hospitals have  staff members who can help you with all these arrangements. How to recognize emotional stress It is normal to have emotional stress after a pregnancy loss. But emotional stress that lasts a long time or becomes severe requires treatment. Watch out for these signs of severe emotional stress:  Sadness, anger, or guilt that is not going away and is interfering with your normal activities.  Relationship problems that have occurred or gotten worse since the pregnancy loss.  Signs of depression that last longer than 2 weeks. These may include: ? Sadness. ? Anxiety. ? Hopelessness. ? Loss of interest in activities you enjoy. ? Inability to concentrate. ? Trouble sleeping or sleeping too much. ? Loss of appetite or overeating. ? Thoughts of death or of hurting yourself. Follow these instructions at home:  Take over-the-counter and prescription medicines only as told by your health care provider.  Rest at home until your energy level returns. Return to your normal activities as told by your health care provider. Ask your health care provider what activities are safe for you.  When you are ready, meet with your health care provider to discuss steps to take for a future pregnancy.  Keep all follow-up visits as told by your health care provider. This is important. Where to find support  To help you and your partner with the process of grieving, talk with your health care provider or seek counseling.  Consider meeting with others who have experienced pregnancy loss. Ask your health care provider about support groups and resources. Where to find more information  [place] Department of Health and Human Services Office on Women's Health: www.womenshealth.gov  American Pregnancy Association: www.americanpregnancy.org Contact a health care provider if:  You continue to experience grief, sadness, or lack of motivation for everyday activities, and those feelings do not improve over time.  You are  struggling to recover emotionally, especially if you are using alcohol or substances to help. Get help right away if:  You have thoughts of hurting yourself or others. If you ever feel like you may hurt yourself or others, or have thoughts about taking your own life, get help right away. You can go to your nearest emergency department or call:  Your local emergency services (911 in the [place]  A suicide crisis helpline, such as the National Suicide Prevention Lifeline at 1-800-273-8255. This is open 24 hours a day. Summary  Any pregnancy loss can be difficult physically and emotionally.  You may experience many different emotions while you grieve. Emotional recovery can last longer than physical recovery.  It is normal to have emotional stress after a pregnancy loss. But emotional stress that lasts a long time or becomes severe requires treatment.  See your health care provider if you are struggling emotionally after a pregnancy loss. This information   is not intended to replace advice given to you by your health care provider. Make sure you discuss any questions you have with your health care provider. Document Revised: 10/08/2018 Document Reviewed: 08/29/2017 Elsevier Patient Education  2020 Elsevier Inc.  FACTS YOU SHOULD KNOW  WHAT IS AN EARLY PREGNANCY FAILURE? Once the egg is fertilized with the sperm and begins to develop, it attaches to the lining of the uterus. This early pregnancy tissue may not develop into an embryo (the beginning stage of a baby). Sometimes an embryo does develop but does not continue to grow. These problems can be seen on ultrasound.   MANAGEMNT OF EARLY PREGNANCY FAILURE: About 4 out of 100 (0.25%) women will have a pregnancy loss in her lifetime.  One in five pregnancies is found to be an early pregnancy failure.  There are 3 ways to care for an early pregnancy failure:   (1) Surgery, (2) Medicine, (3) Waiting for you to pass the pregnancy on your  own. The decision as to how to proceed after being diagnosed with and early pregnancy failure is an individual one.  The decision can be made only after appropriate counseling.  You need to weigh the pros and cons of the 3 choices. Then you can make the choice that works for you. SURGERY (D&E) . Procedure over in 1 day . Requires being put to sleep . Bleeding may be light . Possible problems during surgery, including injury to womb(uterus) . Care provider has more control Medicine (CYTOTEC) . The complete procedure may take days to weeks . No Surgery . Bleeding may be heavy at times . There may be drug side effects . Patient has more control Waiting . You may choose to wait, in which case your own body may complete the passing of the abnormal early pregnancy on its own in about 2-4 weeks . Your bleeding may be heavy at times . There is a small possibility that you may need surgery if the bleeding is too much or not all of the pregnancy has passed. CYTOTEC MANAGEMENT Prostaglandins (cytotec) are the most widely used drug for this purpose. They cause the uterus to cramp and contract. You will place the medicine yourself inside your vagina in the privacy of your home. Empting of the uterus should occur within 3 days but the process may continue for several weeks. The bleeding may seem heavy at times. POSSIBLE SIDE EFFECTS FROM CYTOTEC . Nausea   Vomiting . Diarrhea Fever . Chills  Hot Flashes Side effects  from the process of the early pregnancy failure include: . Cramping  Bleeding . Headaches  Dizziness RISKS: This is a low risk procedure. Less than 1 in 100 women has a complication. An incomplete passage of the early pregnancy may occur. Also, Hemorrhage (heavy bleeding) could happen.  Rarely the pregnancy will not be passed completely. Excessively heavy bleeding may occur.  Your doctor may need to perform surgery to empty the uterus (D&E). Afterwards: Everybody will feel differently  after the early pregnancy completion. You may have soreness or cramps for a day or two. You may have soreness or cramps for day or two.  You may have light bleeding for up to 2 weeks. You may be as active as you feel like being. If you have any of the following problems you may call [facility] at [phone]. . If you have pain that does not get better  with pain medication . Bleeding that soaks through 2 thick full-sized sanitary  pads in an hour . Cramps that last longer than 2 days . Foul smelling discharge . Fever above 100.4 degrees F Even if you do not have any of these symptoms, you should have a follow-up exam to make sure you are healing properly. This appointment will be made for you before you leave the hospital. Your next normal period will start again in 4-6 week after the loss. You can get pregnant soon after the loss, so use birth control right away. Finally: Make sure all your questions are answered before during and after any procedure. Follow up with medical care and family planning methods. 

## 2019-08-19 LAB — GC/CHLAMYDIA PROBE AMP (~~LOC~~) NOT AT ARMC
Chlamydia: NEGATIVE
Comment: NEGATIVE
Comment: NORMAL
Neisseria Gonorrhea: NEGATIVE

## 2019-08-25 ENCOUNTER — Other Ambulatory Visit: Payer: Medicaid Other

## 2019-08-27 ENCOUNTER — Encounter: Payer: Medicaid Other | Admitting: Certified Nurse Midwife

## 2019-09-01 ENCOUNTER — Encounter: Payer: Self-pay | Admitting: Advanced Practice Midwife

## 2019-09-01 ENCOUNTER — Ambulatory Visit (INDEPENDENT_AMBULATORY_CARE_PROVIDER_SITE_OTHER): Payer: Medicaid Other | Admitting: Advanced Practice Midwife

## 2019-09-01 ENCOUNTER — Other Ambulatory Visit: Payer: Self-pay

## 2019-09-01 VITALS — BP 122/80 | HR 80 | Wt 237.0 lb

## 2019-09-01 DIAGNOSIS — O039 Complete or unspecified spontaneous abortion without complication: Secondary | ICD-10-CM | POA: Insufficient documentation

## 2019-09-01 NOTE — Patient Instructions (Signed)
Managing Pregnancy Loss Pregnancy loss can happen any time during a pregnancy. Often the cause is not known. It is rarely because of anything you did. Pregnancy loss in early pregnancy (during the first trimester) is called a miscarriage. This type of pregnancy loss is the most common. Pregnancy loss that happens after 20 weeks of pregnancy is called fetal demise if the baby's heart stops beating before birth. Fetal demise is much less common. Some women experience spontaneous labor shortly after fetal demise resulting in a stillborn birth (stillbirth). Any pregnancy loss can be devastating. You will need to recover both physically and emotionally. Most women are able to get pregnant again after a pregnancy loss and deliver a healthy baby. How to manage emotional recovery  Pregnancy loss is very hard emotionally. You may feel many different emotions while you grieve. You may feel sad and angry. You may also feel guilty. It is normal to have periods of crying. Emotional recovery can take longer than physical recovery. It is different for everyone. Taking these steps can help you in managing this loss:  Remember that it is unlikely you did anything to cause the pregnancy loss.  Share your thoughts and feelings with friends, family, and your partner. Remember that your partner is also recovering emotionally.  Make sure you have a good support system. Do not spend too much time alone.  Meet with a pregnancy loss counselor or join a pregnancy loss support group.  Get enough sleep and eat a healthy diet. Return to regular exercise when you have recovered physically.  Do not use drugs or alcohol to manage your emotions.  Consider seeing a mental health professional to help you recover emotionally.  Ask a friend or loved one to help you decide what to do with any clothing and nursery items you received for your baby. In the case of a stillbirth, many women benefit from taking additional steps in the  grieving process. You may want to:  Hold your baby after the birth.  Name your baby.  Request a birth certificate.  Create a keepsake such as handprints or footprints.  Dress your baby and have a picture taken.  Make funeral arrangements.  Ask for a baptism or blessing. Hospitals have staff members who can help you with all these arrangements. How to recognize emotional stress It is normal to have emotional stress after a pregnancy loss. But emotional stress that lasts a long time or becomes severe requires treatment. Watch out for these signs of severe emotional stress:  Sadness, anger, or guilt that is not going away and is interfering with your normal activities.  Relationship problems that have occurred or gotten worse since the pregnancy loss.  Signs of depression that last longer than 2 weeks. These may include: ? Sadness. ? Anxiety. ? Hopelessness. ? Loss of interest in activities you enjoy. ? Inability to concentrate. ? Trouble sleeping or sleeping too much. ? Loss of appetite or overeating. ? Thoughts of death or of hurting yourself. Follow these instructions at home:  Take over-the-counter and prescription medicines only as told by your health care provider.  Rest at home until your energy level returns. Return to your normal activities as told by your health care provider. Ask your health care provider what activities are safe for you.  When you are ready, meet with your health care provider to discuss steps to take for a future pregnancy.  Keep all follow-up visits as told by your health care provider. This is important.   Where to find support  To help you and your partner with the process of grieving, talk with your health care provider or seek counseling.  Consider meeting with others who have experienced pregnancy loss. Ask your health care provider about support groups and resources. Where to find more information  [place].S. Department of Health and Human  Services Office on Women's Health: www.womenshealth.gov  American Pregnancy Association: www.americanpregnancy.org Contact a health care provider if:  You continue to experience grief, sadness, or lack of motivation for everyday activities, and those feelings do not improve over time.  You are struggling to recover emotionally, especially if you are using alcohol or substances to help. Get help right away if:  You have thoughts of hurting yourself or others. If you ever feel like you may hurt yourself or others, or have thoughts about taking your own life, get help right away. You can go to your nearest emergency department or call:  Your local emergency services (911 in the [place]  A suicide crisis helpline, such as the National Suicide Prevention Lifeline at 1-800-273-8255. This is open 24 hours a day. Summary  Any pregnancy loss can be difficult physically and emotionally.  You may experience many different emotions while you grieve. Emotional recovery can last longer than physical recovery.  It is normal to have emotional stress after a pregnancy loss. But emotional stress that lasts a long time or becomes severe requires treatment.  See your health care provider if you are struggling emotionally after a pregnancy loss. This information is not intended to replace advice given to you by your health care provider. Make sure you discuss any questions you have with your health care provider. Document Revised: 10/08/2018 Document Reviewed: 08/29/2017 Elsevier Patient Education  2020 Elsevier Inc.  

## 2019-09-01 NOTE — Progress Notes (Signed)
  GYNECOLOGY PROGRESS NOTE  History:  24 y.o. G2P1001 presents to [facility] office today for follow up visit for missed abortion, treated with Cytotec on 08/18/19.  She reports bleeding like menses with large clots x 2-3 days with light bleeding for 1 week after. Bleeding is now resolved. She reports some intermittent back pain continues.  There are no other symptoms.  She denies h/a, dizziness, shortness of breath, n/v, or fever/chills.    The following portions of the patient's history were reviewed and updated as appropriate: allergies, current medications, past family history, past medical history, past social history, past surgical history and problem list. Last pap smear on 09/25/17 was normal  Review of Systems:  Pertinent items are noted in HPI.   Objective:  Physical Exam Blood pressure 122/80, pulse 80, weight 237 lb (107.5 kg), last menstrual period 06/12/2019. VS reviewed, nursing note reviewed,  Constitutional: well developed, well nourished, no distress HEENT: normocephalic CV: normal rate Pulm/chest wall: normal effort Breast Exam: deferred Abdomen: soft Neuro: alert and oriented x 3 Skin: warm, dry Psych: affect normal Pelvic exam: Cervix pink, visually closed, without lesion, scant white creamy discharge, vaginal walls and external genitalia normal Bimanual exam: Cervix 0/long/high, firm, anterior, neg CMT, uterus nontender, nonenlarged, adnexa without tenderness, enlargement, or mass  Assessment & Plan:  1. SAB (spontaneous abortion) --Missed ab treated with Cytotec on 08/19/19.  Pt with appropriate response to Cytotec, with bleeding now resolved. --Discussed plan to follow hcg down to below 5  --Refer to ambulatory behavioral health after miscarriage. Pt reports she is better physically than emotionally after this loss. She denies any thoughts of harming herself or others and has good support.  She would like to speak with counselor to help her through the process.    --Pt desires another pregnancy, recommend waiting until menses resume but no risks in pregnancy after miscarriage - Beta hCG quant (ref lab) --Hcg in 1 week  Sharen Counter, CNM 11:25 AM

## 2019-09-02 LAB — BETA HCG QUANT (REF LAB): hCG Quant: 7 m[IU]/mL

## 2019-09-08 ENCOUNTER — Ambulatory Visit (INDEPENDENT_AMBULATORY_CARE_PROVIDER_SITE_OTHER): Payer: Medicaid Other | Admitting: Licensed Clinical Social Worker

## 2019-09-08 ENCOUNTER — Other Ambulatory Visit: Payer: Medicaid Other

## 2019-09-08 ENCOUNTER — Other Ambulatory Visit: Payer: Self-pay

## 2019-09-08 DIAGNOSIS — O039 Complete or unspecified spontaneous abortion without complication: Secondary | ICD-10-CM

## 2019-09-08 DIAGNOSIS — F4321 Adjustment disorder with depressed mood: Secondary | ICD-10-CM | POA: Diagnosis not present

## 2019-09-09 LAB — BETA HCG QUANT (REF LAB): hCG Quant: 2 m[IU]/mL

## 2019-09-09 NOTE — BH Specialist Note (Signed)
Integrated Behavioral Health Initial Visit  MRN: [id number] Name: Felicia Holt  Number of Integrated Behavioral Health Clinician visits:: 1 Session Start time: 10:00am  Session End time: 10:31am Total time: 31 minutes   Type of Service: Integrated Behavioral Health- Individual Interpretor: No  Interpretor Name and Language: None   Warm Hand Off Completed.       SUBJECTIVE: Felicia Holt is a 24 y.o. female  Patient was referred by CNM [place] for integrated behavioral health Patient reports spontaneous abortion 08/18/2019 Duration of problem: one month ; Severity of problem: mild   OBJECTIVE: Mood: Sad and Affect: Congruent  Risk of harm to self or others: No risk of harm to self or others  LIFE CONTEXT: Family and Social: Lives with boyfriend in [place]  School/Work: n/a Self-Care: n/a Life Changes: recent miscarriage   GOALS ADDRESSED: Patient will: 1. Reduce symptoms of: feelings of guilt  2. Increase knowledge of adjustment disorder   3. Demonstrate ability to: self manage symptoms   INTERVENTIONS: Interventions utilized: brief supportive counseling   Standardized Assessments completed: edinburgh postnatal depression scale   ASSESSMENT: Patient currently experiencing adjustment disorder with depressed mood    Patient may benefit from integrated behavioral health  PLAN: 1. Follow up with behavioral health clinician on : as needed  2. Behavioral recommendations: demonstrate positive affirmations, narrative thinking  3. Referral(s):  4. "From scale of 1-10, how likely are you to follow plan?":   Gwyndolyn Saxon, LCSW

## 2019-09-09 NOTE — Addendum Note (Signed)
Addended by: Gwyndolyn Saxon on: 09/09/2019 01:32 PM   Modules accepted: Level of Service

## 2020-03-23 ENCOUNTER — Encounter (HOSPITAL_COMMUNITY): Payer: Self-pay | Admitting: Emergency Medicine

## 2020-03-23 ENCOUNTER — Emergency Department (HOSPITAL_COMMUNITY): Payer: Medicaid Other

## 2020-03-23 ENCOUNTER — Emergency Department (HOSPITAL_COMMUNITY)
Admission: EM | Admit: 2020-03-23 | Discharge: 2020-03-23 | Disposition: A | Discharge status: Home / Self Care | Payer: Medicaid Other | Attending: Emergency Medicine | Admitting: Emergency Medicine

## 2020-03-23 DIAGNOSIS — M542 Cervicalgia: Secondary | ICD-10-CM | POA: Insufficient documentation

## 2020-03-23 DIAGNOSIS — M79601 Pain in right arm: Secondary | ICD-10-CM | POA: Insufficient documentation

## 2020-03-23 DIAGNOSIS — Z5321 Procedure and treatment not carried out due to patient leaving prior to being seen by health care provider: Secondary | ICD-10-CM | POA: Insufficient documentation

## 2020-03-23 DIAGNOSIS — M549 Dorsalgia, unspecified: Secondary | ICD-10-CM | POA: Diagnosis not present

## 2020-03-23 DIAGNOSIS — R42 Dizziness and giddiness: Secondary | ICD-10-CM | POA: Diagnosis not present

## 2020-03-23 DIAGNOSIS — W109XXA Fall (on) (from) unspecified stairs and steps, initial encounter: Secondary | ICD-10-CM | POA: Diagnosis not present

## 2020-03-23 DIAGNOSIS — R55 Syncope and collapse: Secondary | ICD-10-CM | POA: Insufficient documentation

## 2020-03-23 DIAGNOSIS — R072 Precordial pain: Secondary | ICD-10-CM | POA: Diagnosis not present

## 2020-03-23 LAB — I-STAT BETA HCG BLOOD, ED (MC, WL, AP ONLY): I-stat hCG, quantitative: <5 m[IU]/mL (ref <5)

## 2020-03-23 LAB — BASIC METABOLIC PANEL
Anion gap: 12 (ref 5–15)
BUN: 13 mg/dL (ref 6–20)
CO2: 21 mmol/L — ABNORMAL LOW (ref 22–32)
Calcium: 9.1 mg/dL (ref 8.9–10.3)
Chloride: 102 mmol/L (ref 98–111)
Creatinine, Ser: 0.9 mg/dL (ref 0.44–1.00)
GFR calc Af Amer: >60 mL/min (ref >60)
GFR calc non Af Amer: >60 mL/min (ref >60)
Glucose, Bld: 374 mg/dL — ABNORMAL HIGH (ref 70–99)
Potassium: 4 mmol/L (ref 3.5–5.1)
Sodium: 135 mmol/L (ref 135–145)

## 2020-03-23 LAB — CBC
HCT: 40.4 % (ref 36.0–46.0)
Hemoglobin: 13.3 g/dL (ref 12.0–15.0)
MCH: 28.5 pg (ref 26.0–34.0)
MCHC: 32.9 g/dL (ref 30.0–36.0)
MCV: 86.5 fL (ref 80.0–100.0)
Platelets: 291 10*3/uL (ref 150–400)
RBC: 4.67 MIL/uL (ref 3.87–5.11)
RDW: 12.2 % (ref 11.5–15.5)
WBC: 9.6 10*3/uL (ref 4.0–10.5)
nRBC: 0 % (ref 0.0–0.2)

## 2020-03-23 LAB — TROPONIN I (HIGH SENSITIVITY): Troponin I (High Sensitivity): 6 ng/L (ref <18)

## 2020-03-23 LAB — CBG MONITORING, ED: Glucose-Capillary: 376 mg/dL — ABNORMAL HIGH (ref 70–99)

## 2020-03-23 NOTE — ED Notes (Signed)
Called once for CT- no answer

## 2020-03-23 NOTE — ED Notes (Signed)
Pt LWBS 

## 2020-03-23 NOTE — ED Triage Notes (Addendum)
Pt arrives via EMS from home with mechanical fall down approx 12 steps. +LOC, states felt dizzy/lightheaded prior to fall c/o neck, back, right arm and sternal CP. Pt alert, oriented, ambulatory with assistance. EKG NSR. fentanyl PTA. 20g LAC, cbg 392, VSS .

## 2022-02-02 ENCOUNTER — Other Ambulatory Visit: Payer: Self-pay

## 2022-02-02 ENCOUNTER — Emergency Department (HOSPITAL_COMMUNITY)
Admission: EM | Admit: 2022-02-02 | Discharge: 2022-02-02 | Disposition: A | Discharge status: Home / Self Care | Payer: Medicaid Other | Attending: Emergency Medicine | Admitting: Emergency Medicine

## 2022-02-02 ENCOUNTER — Encounter (HOSPITAL_COMMUNITY): Payer: Self-pay

## 2022-02-02 DIAGNOSIS — N898 Other specified noninflammatory disorders of vagina: Secondary | ICD-10-CM | POA: Insufficient documentation

## 2022-02-02 LAB — WET PREP, GENITAL
Clue Cells Wet Prep HPF POC: NONE SEEN
Sperm: NONE SEEN
Trich, Wet Prep: NONE SEEN
WBC, Wet Prep HPF POC: >=10 — AB (ref <10)
Yeast Wet Prep HPF POC: NONE SEEN

## 2022-02-02 LAB — URINALYSIS, ROUTINE W REFLEX MICROSCOPIC
Bilirubin Urine: NEGATIVE
Glucose, UA: >=500 mg/dL — AB
Ketones, ur: NEGATIVE mg/dL
Nitrite: NEGATIVE
Protein, ur: 30 mg/dL — AB
Specific Gravity, Urine: 1.033 — ABNORMAL HIGH (ref 1.005–1.030)
pH: 5 (ref 5.0–8.0)

## 2022-02-02 LAB — I-STAT BETA HCG BLOOD, ED (MC, WL, AP ONLY): I-stat hCG, quantitative: <5 m[IU]/mL (ref <5)

## 2022-02-02 MED ORDER — LIDOCAINE HCL (PF) 1 % IJ SOLN
1.0000 mL | Freq: Once | INTRAMUSCULAR | Status: AC
  Administered 2022-02-02: 1 mL
  Filled 2022-02-02: qty 5

## 2022-02-02 MED ORDER — CEFTRIAXONE SODIUM 500 MG IJ SOLR
500.0000 mg | Freq: Once | INTRAMUSCULAR | Status: AC
  Administered 2022-02-02: 500 mg via INTRAMUSCULAR
  Filled 2022-02-02: qty 500

## 2022-02-02 MED ORDER — HYDROCORTISONE 1 % EX CREA: 1.0000 | TOPICAL_CREAM | Freq: Two times a day (BID) | CUTANEOUS | 0 refills | Status: AC | PRN

## 2022-02-02 MED ORDER — AZITHROMYCIN 250 MG PO TABS
1000.0000 mg | ORAL_TABLET | Freq: Once | ORAL | Status: AC
  Administered 2022-02-02: 1000 mg via ORAL
  Filled 2022-02-02: qty 4

## 2022-02-02 MED ORDER — FLUCONAZOLE 150 MG PO TABS
150.0000 mg | ORAL_TABLET | Freq: Once | ORAL | Status: AC
  Administered 2022-02-02: 150 mg via ORAL
  Filled 2022-02-02: qty 1

## 2022-02-02 NOTE — ED Provider Notes (Signed)
[facility] EMERGENCY DEPARTMENT Provider Note   CSN: [id number] Arrival date & time: 02/02/22  0645     History  Chief Complaint  Patient presents with   Vaginal Itching    Felicia Holt is a 26 y.o. female.  Pt is a 26 yo female with a pmhx significant for GERD.  She said she's had vaginal itching and discomfort for about 2 weeks.  She thought she had a yeast infection, so she did a 7 day course of monistat.  She said that did not work.  She is sexually active, but has not had sex since the itching because it would have been uncomfortable.  She denies any d/c.        Home Medications Prior to Admission medications   Medication Sig Start Date End Date Taking? Authorizing Provider  hydrocortisone cream 1 % Apply 1 Application topically 2 (two) times daily as needed for itching. 02/02/22  Yes Jacalyn Lefevre, MD  ibuprofen (ADVIL) 600 MG tablet Take 1 tablet (600 mg total) by mouth every 6 (six) hours as needed for up to 30 doses for moderate pain or cramping. Patient not taking: Reported on 09/01/2019 08/18/19   Nugent, Odie Sera, NP  misoprostol (CYTOTEC) 200 MCG tablet Take 4 tablets (800 mcg total) by mouth once for 1 dose. 08/18/19 08/18/19  Nugent, Odie Sera, NP  promethazine (PHENERGAN) 12.5 MG tablet Take 1 tablet (12.5 mg total) by mouth every 6 (six) hours as needed for nausea or vomiting. Patient not taking: Reported on 09/01/2019 08/18/19   Nugent, Odie Sera, NP      Allergies    Patient has no known allergies.    Review of Systems   Review of Systems  Genitourinary:        Vaginal itching  All other systems reviewed and are negative.   Physical Exam Updated Vital Signs BP 120/84 (BP Location: Right Arm)   Pulse 66   Temp 97.9 F (36.6 C) (Oral)   Resp 16   Ht 5\' 3"  (1.6 m)   Wt 104.3 kg   LMP 01/26/2022   SpO2 96%   BMI 40.74 kg/m  Physical Exam Vitals and nursing note reviewed. Exam conducted with a chaperone present.  Constitutional:       Appearance: Normal appearance. She is obese.  HENT:     Head: Normocephalic and atraumatic.     Right Ear: External ear normal.     Left Ear: External ear normal.     Nose: Nose normal.     Mouth/Throat:     Mouth: Mucous membranes are moist.     Pharynx: Oropharynx is clear.  Eyes:     Extraocular Movements: Extraocular movements intact.     Pupils: Pupils are equal, round, and reactive to light.  Cardiovascular:     Rate and Rhythm: Normal rate and regular rhythm.     Pulses: Normal pulses.     Heart sounds: Normal heart sounds.  Pulmonary:     Effort: Pulmonary effort is normal.     Breath sounds: Normal breath sounds.  Abdominal:     General: Abdomen is flat. Bowel sounds are normal.     Palpations: Abdomen is soft.  Genitourinary:    Exam position: Lithotomy position.     Vagina: Normal.     Cervix: Normal.     Uterus: Normal.      Adnexa: Right adnexa normal.  Musculoskeletal:        General: Normal range of motion.  Cervical back: Normal range of motion and neck supple.  Skin:    General: Skin is warm.     Capillary Refill: Capillary refill takes less than 2 seconds.  Neurological:     General: No focal deficit present.     Mental Status: She is alert and oriented to person, place, and time.  Psychiatric:        Mood and Affect: Mood normal.        Behavior: Behavior normal.     ED Results / Procedures / Treatments   Labs (all labs ordered are listed, but only abnormal results are displayed) Labs Reviewed  WET PREP, GENITAL - Abnormal; Notable for the following components:      Result Value   WBC, Wet Prep HPF POC >=10 (*)    All other components within normal limits  URINALYSIS, ROUTINE W REFLEX MICROSCOPIC - Abnormal; Notable for the following components:   APPearance CLOUDY (*)    Specific Gravity, Urine 1.033 (*)    Glucose, UA >=500 (*)    Hgb urine dipstick MODERATE (*)    Protein, ur 30 (*)    Leukocytes,Ua LARGE (*)    Bacteria, UA RARE (*)     Non Squamous Epithelial 0-5 (*)    All other components within normal limits  I-STAT BETA HCG BLOOD, ED ([facility], [facility], [facility] ONLY)  GC/CHLAMYDIA PROBE AMP ([place]) NOT AT [facility]    EKG None  Radiology No results found.  Procedures Procedures    Medications Ordered in ED Medications  cefTRIAXone (ROCEPHIN) injection 500 mg (has no administration in time range)  lidocaine (PF) (XYLOCAINE) 1 % injection 1-2.1 mL (has no administration in time range)  azithromycin (ZITHROMAX) tablet 1,000 mg (has no administration in time range)  fluconazole (DIFLUCAN) tablet 150 mg (has no administration in time range)    ED Course/ Medical Decision Making/ A&P                           Medical Decision Making Amount and/or Complexity of Data Reviewed Labs: ordered.   This patient presents to the ED for concern of vaginal itching, this involves an extensive number of treatment options, and is a complaint that carries with it a high risk of complications and morbidity.  The differential diagnosis includes sti, bv, yeast, uti   Co morbidities that complicate the patient evaluation  obesity   Additional history obtained:  Additional history obtained from epic chart review   Lab Tests:  I Ordered, and personally interpreted labs.  The pertinent results include:  preg neg, wet prep neg; UA with large LE, but it is a dirty specimen with 21-50 wbcs.     Medicines ordered and prescription drug management:  I ordered medication including diflucan  for yeast and rocephin/zithromax for sti Reevaluation of the patient after these medicines showed that the patient stayed the same I have reviewed the patients home medicines and have made adjustments as needed   Test Considered:  Gc/chl   Critical Interventions:  abx   Problem List / ED Course:  Vaginitis:  no yeast seen, but it could be partially treated with the monistat. Pt requests tx for chlamydia or gonorrhea.  She is given  rocephin and zithromax.  She is encouraged to avoid all bubble baths, fragrances, she is to wear loose fitting clothes and cotton underwear.  She is to f/u with the women's clinic.      Reevaluation:  After  the interventions noted above, I reevaluated the patient and found that they have :improved   Social Determinants of Health:  Lives at home   Dispostion:  After consideration of the diagnostic results and the patients response to treatment, I feel that the patent would benefit from discharge with outpatient f/u.          Final Clinical Impression(s) / ED Diagnoses Final diagnoses:  Vaginal irritation    Rx / DC Orders ED Discharge Orders          Ordered    hydrocortisone cream 1 %  2 times daily PRN        02/02/22 0849              Jacalyn Lefevre, MD 02/02/22 [phone]

## 2022-02-02 NOTE — ED Triage Notes (Signed)
Reports vaginal itching and burning.  Attempted monistat over the counter with no relief.

## 2022-02-06 LAB — GC/CHLAMYDIA PROBE AMP (~~LOC~~) NOT AT ARMC
Comment: NEGATIVE
Comment: NORMAL

## 2022-07-29 ENCOUNTER — Emergency Department (HOSPITAL_COMMUNITY)
Admission: EM | Admit: 2022-07-29 | Discharge: 2022-07-29 | Disposition: A | Discharge status: Home / Self Care | Payer: Medicaid Other | Attending: Emergency Medicine | Admitting: Emergency Medicine

## 2022-07-29 ENCOUNTER — Other Ambulatory Visit: Payer: Self-pay

## 2022-07-29 ENCOUNTER — Encounter (HOSPITAL_COMMUNITY): Payer: Self-pay

## 2022-07-29 DIAGNOSIS — Z1152 Encounter for screening for COVID-19: Secondary | ICD-10-CM | POA: Insufficient documentation

## 2022-07-29 DIAGNOSIS — R0981 Nasal congestion: Secondary | ICD-10-CM | POA: Diagnosis present

## 2022-07-29 DIAGNOSIS — J069 Acute upper respiratory infection, unspecified: Secondary | ICD-10-CM

## 2022-07-29 DIAGNOSIS — J101 Influenza due to other identified influenza virus with other respiratory manifestations: Secondary | ICD-10-CM | POA: Diagnosis not present

## 2022-07-29 LAB — RESP PANEL BY RT-PCR (RSV, FLU A&B, COVID)  RVPGX2
Influenza A by PCR: NEGATIVE
Influenza B by PCR: POSITIVE — AB
Resp Syncytial Virus by PCR: NEGATIVE
SARS Coronavirus 2 by RT PCR: NEGATIVE

## 2022-07-29 MED ORDER — BENZONATATE 100 MG PO CAPS: 100.0000 mg | ORAL_CAPSULE | Freq: Three times a day (TID) | ORAL | 0 refills | Status: AC

## 2022-07-29 NOTE — ED Provider Notes (Signed)
[facility] Provider Note   CSN: [id number] Arrival date & time: 07/29/22  1749     History  Chief Complaint  Patient presents with   Nasal Congestion    Felicia Holt is a 27 y.o. female who presents emergency department with concerns for nasal congestion onset 3 days.  Has sick contacts at work.  Has associated generalized bodyaches, rhinorrhea, resolved nausea, cough, resolved diarrhea.  Has tried over-the-counter Mucinex for her symptoms.  Denies chest pain, shortness of breath, sore throat.  The history is provided by the patient. No language interpreter was used.       Home Medications Prior to Admission medications   Medication Sig Start Date End Date Taking? Authorizing Provider  benzonatate (TESSALON) 100 MG capsule Take 1 capsule (100 mg total) by mouth every 8 (eight) hours. 07/29/22  Yes Analys Ryden A, PA-C  hydrocortisone cream 1 % Apply 1 Application topically 2 (two) times daily as needed for itching. 02/02/22   Isla Pence, MD  ibuprofen (ADVIL) 600 MG tablet Take 1 tablet (600 mg total) by mouth every 6 (six) hours as needed for up to 30 doses for moderate pain or cramping. Patient not taking: Reported on 09/01/2019 08/18/19   Nugent, Gerrie Nordmann, NP  misoprostol (CYTOTEC) 200 MCG tablet Take 4 tablets (800 mcg total) by mouth once for 1 dose. 08/18/19 08/18/19  Nugent, Gerrie Nordmann, NP  promethazine (PHENERGAN) 12.5 MG tablet Take 1 tablet (12.5 mg total) by mouth every 6 (six) hours as needed for nausea or vomiting. Patient not taking: Reported on 09/01/2019 08/18/19   Nugent, Gerrie Nordmann, NP      Allergies    Patient has no known allergies.    Review of Systems   Review of Systems  All other systems reviewed and are negative.   Physical Exam Updated Vital Signs BP 123/82 (BP Location: Right Arm)   Pulse 85   Temp 98.2 F (36.8 C) (Oral)   Resp 16   SpO2 97%  Physical Exam Vitals and nursing note reviewed.   Constitutional:      General: She is not in acute distress.    Appearance: Normal appearance.  Eyes:     General: No scleral icterus.    Extraocular Movements: Extraocular movements intact.  Cardiovascular:     Rate and Rhythm: Normal rate and regular rhythm.     Pulses: Normal pulses.     Heart sounds: Normal heart sounds.  Pulmonary:     Effort: Pulmonary effort is normal. No respiratory distress.     Breath sounds: Normal breath sounds.  Abdominal:     Palpations: Abdomen is soft. There is no mass.     Tenderness: There is no abdominal tenderness.  Musculoskeletal:        General: Normal range of motion.     Cervical back: Neck supple.  Skin:    General: Skin is warm and dry.     Findings: No rash.  Neurological:     Mental Status: She is alert.     Sensory: Sensation is intact.     Motor: Motor function is intact.  Psychiatric:        Behavior: Behavior normal.     ED Results / Procedures / Treatments   Labs (all labs ordered are listed, but only abnormal results are displayed) Labs Reviewed  RESP PANEL BY RT-PCR (RSV, FLU A&B, COVID)  [id number]    EKG None  Radiology No results found.  Procedures Procedures    Medications Ordered in ED Medications - No data to display  ED Course/ Medical Decision Making/ A&P Clinical Course as of 07/29/22 1127  Sun Jul 29, 2022  1123 Influenza B By PCR(!): POSITIVE [SB]  1125 Called patient and confirmed HIPAA compliant information. Pt confirmed two identifiers.  Discussed with patient swab results.  Answered all available questions. [SB]    Clinical Course User Index [SB] Jayren Cease A, PA-C                             Medical Decision Making Risk Prescription drug management.   Pt presents with concerns for nasal congestion onset 3 days. Sick contacts at work. Vital signs, pt afebrile. On exam, pt with no acute cardiovascular, respiratory exam findings. Differential diagnosis includes COVID, flu, RSV, viral  URI with cough, PNA.     Labs:  I ordered, and personally interpreted labs.  The pertinent results include:   COVID swab negative Flu swab positive for Flu B RSV swab negative  Disposition: Presentation suspicious for Flu B. Doubt COVID, RSV, Strep pharyngitis, or PNA at this time. Discussed with patient Tamiflu, however, patient symptoms > 48 hours for initiation for Tamiflu. After consideration of the diagnostic results and the patients response to treatment, I feel that the patient would benefit from Discharge home. Work note provided. Rx for tessalon perles sent. Supportive care measures and strict return precautions discussed with patient at bedside. Pt acknowledges and verbalizes understanding. Pt appears safe for discharge. Follow up as indicated in discharge paperwork.    This chart was dictated using voice recognition software, Dragon. Despite the best efforts of this provider to proofread and correct errors, errors may still occur which can change documentation meaning.    Final Clinical Impression(s) / ED Diagnoses Final diagnoses:  Viral URI with cough    Rx / DC Orders ED Discharge Orders          Ordered    benzonatate (TESSALON) 100 MG capsule  Every 8 hours        07/29/22 0950              Kellsey Sansone A, PA-C 07/29/22 Newsoms, Ankit, MD 07/29/22 1447

## 2022-07-29 NOTE — ED Triage Notes (Signed)
Pt arrived POV from home c/o nasal congestion, runny nose, and cannot smell for 3 days.

## 2022-07-29 NOTE — Discharge Instructions (Addendum)
It was a pleasure taking care of you!   You have pending results for COVID, flu, RSV.  You will be called and notified of the findings.  You will also be able to see the results on your MyChart app.  You may take over the counter 600 mg Ibuprofen every 6 hours and alternate with 500 mg Tylenol every 6 hours as needed for pain for no more than 7 days.  You may take over-the-counter cough and cold medications as needed for your symptoms. For your cough, you will be sent a prescription called Tessalon perles, take as directed. Ensure to maintain fluid intake with tea, soup, broth, Pedialyte, Gatorade, water.  You may follow-up with your primary care provider as needed.  Return to the Emergency Department if you are experiencing increasing/worsening symptoms.

## 2022-07-29 NOTE — ED Provider Triage Note (Signed)
Emergency Medicine Provider Triage Evaluation Note  Tineshia Becraft , a 27 y.o. female  was evaluated in triage.  Pt complains of nasal congestion onset 3 days. Notes sick contacts at work. Has rhinorrhea, nausea, diarrhea (resolved), generalized weakness. Denies chest pain, shortness of breath, sore throat.   Review of Systems  Positive:  Negative:   Physical Exam  BP 123/82 (BP Location: Right Arm)   Pulse 85   Temp 98.2 F (36.8 C) (Oral)   Resp 16   SpO2 97%  Gen:   Awake, no distress   Resp:  Normal effort  MSK:   Moves extremities without difficulty  Other:    Medical Decision Making  Medically screening exam initiated at 9:26 AM.  Appropriate orders placed.  Adelin Ventrella was informed that the remainder of the evaluation will be completed by another provider, this initial triage assessment does not replace that evaluation, and the importance of remaining in the ED until their evaluation is complete.  Work-up initiated.    Gladys Gutman A, PA-C 07/29/22 1102

## 2023-11-26 ENCOUNTER — Other Ambulatory Visit: Payer: Self-pay

## 2023-11-26 ENCOUNTER — Encounter (HOSPITAL_COMMUNITY): Payer: Self-pay | Admitting: Emergency Medicine

## 2023-11-26 ENCOUNTER — Emergency Department (HOSPITAL_COMMUNITY)
Admission: EM | Admit: 2023-11-26 | Discharge: 2023-11-26 | Disposition: A | Discharge status: Home / Self Care | Attending: Emergency Medicine | Admitting: Emergency Medicine

## 2023-11-26 DIAGNOSIS — Z7984 Long term (current) use of oral hypoglycemic drugs: Secondary | ICD-10-CM | POA: Diagnosis not present

## 2023-11-26 DIAGNOSIS — K649 Unspecified hemorrhoids: Secondary | ICD-10-CM | POA: Insufficient documentation

## 2023-11-26 DIAGNOSIS — E1165 Type 2 diabetes mellitus with hyperglycemia: Secondary | ICD-10-CM | POA: Diagnosis not present

## 2023-11-26 DIAGNOSIS — B3731 Acute candidiasis of vulva and vagina: Secondary | ICD-10-CM | POA: Diagnosis not present

## 2023-11-26 DIAGNOSIS — E119 Type 2 diabetes mellitus without complications: Secondary | ICD-10-CM

## 2023-11-26 DIAGNOSIS — R739 Hyperglycemia, unspecified: Secondary | ICD-10-CM

## 2023-11-26 LAB — COMPREHENSIVE METABOLIC PANEL WITH GFR
ALT: 17 U/L (ref 0–44)
AST: 17 U/L (ref 15–41)
Albumin: 3.3 g/dL — ABNORMAL LOW (ref 3.5–5.0)
Alkaline Phosphatase: 48 U/L (ref 38–126)
Anion gap: 9 (ref 5–15)
BUN: 16 mg/dL (ref 6–20)
CO2: 22 mmol/L (ref 22–32)
Calcium: 8.5 mg/dL — ABNORMAL LOW (ref 8.9–10.3)
Chloride: 105 mmol/L (ref 98–111)
Creatinine, Ser: 0.85 mg/dL (ref 0.44–1.00)
GFR, Estimated: >60 mL/min (ref >60)
Glucose, Bld: 278 mg/dL — ABNORMAL HIGH (ref 70–99)
Potassium: 4 mmol/L (ref 3.5–5.1)
Sodium: 136 mmol/L (ref 135–145)
Total Bilirubin: 0.5 mg/dL (ref 0.0–1.2)
Total Protein: 7 g/dL (ref 6.5–8.1)

## 2023-11-26 LAB — URINALYSIS, W/ REFLEX TO CULTURE (INFECTION SUSPECTED)
Bilirubin Urine: NEGATIVE
Glucose, UA: >=500 mg/dL — AB
Hgb urine dipstick: NEGATIVE
Ketones, ur: 5 mg/dL — AB
Nitrite: NEGATIVE
Protein, ur: NEGATIVE mg/dL
Specific Gravity, Urine: 1.038 — ABNORMAL HIGH (ref 1.005–1.030)
pH: 5 (ref 5.0–8.0)

## 2023-11-26 LAB — CBC WITH DIFFERENTIAL/PLATELET
Abs Immature Granulocytes: 0.03 10*3/uL (ref 0.00–0.07)
Basophils Absolute: 0 10*3/uL (ref 0.0–0.1)
Basophils Relative: 0 %
Eosinophils Absolute: 0.1 10*3/uL (ref 0.0–0.5)
Eosinophils Relative: 1 %
HCT: 40.6 % (ref 36.0–46.0)
Hemoglobin: 13.8 g/dL (ref 12.0–15.0)
Immature Granulocytes: 0 %
Lymphocytes Relative: 28 %
Lymphs Abs: 2.9 10*3/uL (ref 0.7–4.0)
MCH: 28.6 pg (ref 26.0–34.0)
MCHC: 34 g/dL (ref 30.0–36.0)
MCV: 84.2 fL (ref 80.0–100.0)
Monocytes Absolute: 0.6 10*3/uL (ref 0.1–1.0)
Monocytes Relative: 6 %
Neutro Abs: 6.8 10*3/uL (ref 1.7–7.7)
Neutrophils Relative %: 65 %
Platelets: 326 10*3/uL (ref 150–400)
RBC: 4.82 MIL/uL (ref 3.87–5.11)
RDW: 11.9 % (ref 11.5–15.5)
WBC: 10.5 10*3/uL (ref 4.0–10.5)
nRBC: 0 % (ref 0.0–0.2)

## 2023-11-26 LAB — CBG MONITORING, ED: Glucose-Capillary: 280 mg/dL — ABNORMAL HIGH (ref 70–99)

## 2023-11-26 MED ORDER — GLIPIZIDE 10 MG PO TABS
10.0000 mg | ORAL_TABLET | Freq: Every day | ORAL | 11 refills | Status: AC
Start: 2023-11-26 — End: 2024-11-25

## 2023-11-26 MED ORDER — BLOOD GLUCOSE TEST VI STRP: 1.0000 | ORAL_STRIP | Freq: Three times a day (TID) | 0 refills | Status: AC

## 2023-11-26 MED ORDER — BLOOD GLUCOSE MONITORING SUPPL DEVI: 1.0000 | Freq: Three times a day (TID) | 0 refills | Status: AC

## 2023-11-26 MED ORDER — LANCET DEVICE MISC: 1.0000 | Freq: Three times a day (TID) | 0 refills | Status: AC

## 2023-11-26 MED ORDER — LANCETS MISC. MISC
1.0000 | Freq: Three times a day (TID) | 0 refills | Status: AC
Start: 2023-11-26 — End: 2023-12-26

## 2023-11-26 MED ORDER — ACETAMINOPHEN 500 MG PO TABS
1000.0000 mg | ORAL_TABLET | Freq: Once | ORAL | Status: AC
  Administered 2023-11-26: 1000 mg via ORAL
  Filled 2023-11-26: qty 2

## 2023-11-26 MED ORDER — FLUCONAZOLE 150 MG PO TABS: 150.0000 mg | ORAL_TABLET | Freq: Every day | ORAL | 1 refills | Status: AC

## 2023-11-26 MED ORDER — SODIUM CHLORIDE 0.9 % IV BOLUS
1000.0000 mL | Freq: Once | INTRAVENOUS | Status: AC
  Administered 2023-11-26: 1000 mL via INTRAVENOUS

## 2023-11-26 NOTE — ED Provider Triage Note (Signed)
 Emergency Medicine Provider Triage Evaluation Note  Felicia Holt , a 28 y.o. female  was evaluated in triage.  Pt complains of tingling in her hands and feet, nausea and headache.  She is concerned that her glucose is elevated because she has felt like this in the past when her glucose has been elevated.  She was previously diagnosed with diabetes and had gestational diabetes.  She was started on metformin  and then switched to another medication in 2017 but did not started.  She was seen a few months ago and was prescribed the same medication for diabetes but did not start it because she did not want to start a new medication.  She denies any fevers, she does have some burning on urination..  Review of Systems  Positive: Nausea, tingling in hands and feet Negative: Vomiting, fevers  Physical Exam  BP (!) 150/97 (BP Location: Right Arm)   Pulse 88   Temp 98.3 F (36.8 C)   Resp (!) 22   SpO2 100%  Gen:   Awake, no distress    Resp:  Normal effort   MSK:   Moves extremities without difficulty   Other:     Medical Decision Making  Medically screening exam initiated at 8:49 AM.  Appropriate orders placed.  Lynda Wanninger was informed that the remainder of the evaluation will be completed by another provider, this initial triage assessment does not replace that evaluation, and the importance of remaining in the ED until their evaluation is complete.      Hershel Los, MD 11/26/23 [phone]

## 2023-11-26 NOTE — ED Triage Notes (Signed)
 Pt. Stated, last Saturday I started having numbness in my hands and feet and burning sensation in my vaginal area

## 2023-11-26 NOTE — ED Provider Notes (Signed)
 [facility] EMERGENCY DEPARTMENT AT [facility] Provider Note   CSN: [id number] Arrival date & time: 11/26/23  0840     History  Chief Complaint  Patient presents with   Numbness   vaginal burning    Felicia Holt is a 28 y.o. female.  Pt complains of feeling like her glucose is elevated.  Patient reports that she has had elevated glucose levels on and off since her pregnancy in 2015.  Patient has been on metformin  in the past but could not tolerate due to diarrhea.  Patient ask her primary care to put her on Rybelsus.  Patient states that they did not give her this medication and gave her prescription for metformin  which she cannot tolerate.  Patient also reports vaginal itching and feeling like she has a hemorrhoid.  Patient has had yeast infections in the past.  Patient does not check her glucose at home.  She does not have a monitor.  Patient denies any fever or chills she denies any burning with urination.  She denies any STD risk.  The history is provided by the patient. No language interpreter was used.       Home Medications Prior to Admission medications   Medication Sig Start Date End Date Taking? Authorizing Provider  Blood Glucose Monitoring Suppl DEVI 1 each by Does not apply route in the morning, at noon, and at bedtime. May substitute to any manufacturer covered by patient's insurance. 11/26/23  Yes Ronald Vinsant K, PA-C  fluconazole  (DIFLUCAN ) 150 MG tablet Take 1 tablet (150 mg total) by mouth daily. 11/26/23  Yes Dorothey Gate K, PA-C  glipiZIDE  (GLUCOTROL ) 10 MG tablet Take 1 tablet (10 mg total) by mouth daily. 11/26/23 11/25/24 Yes Dorothey Gate K, PA-C  Glucose Blood (BLOOD GLUCOSE TEST STRIPS) STRP 1 each by In Vitro route in the morning, at noon, and at bedtime. May substitute to any manufacturer covered by patient's insurance. 11/26/23 12/26/23 Yes Sandi Crosby, PA-C  Lancet Device MISC 1 each by Does not apply route in the morning, at noon, and at  bedtime. May substitute to any manufacturer covered by patient's insurance. 11/26/23 12/26/23 Yes Sandi Crosby, PA-C  Lancets Misc. MISC 1 each by Does not apply route in the morning, at noon, and at bedtime. May substitute to any manufacturer covered by patient's insurance. 11/26/23 12/26/23 Yes Dorothey Gate K, PA-C  benzonatate  (TESSALON ) 100 MG capsule Take 1 capsule (100 mg total) by mouth every 8 (eight) hours. 07/29/22   Blue, Soijett A, PA-C  hydrocortisone  cream 1 % Apply 1 Application topically 2 (two) times daily as needed for itching. 02/02/22   Sueellen Emery, MD  ibuprofen  (ADVIL ) 600 MG tablet Take 1 tablet (600 mg total) by mouth every 6 (six) hours as needed for up to 30 doses for moderate pain or cramping. Patient not taking: Reported on 09/01/2019 08/18/19   Nugent, Merilee Stanley, NP  misoprostol  (CYTOTEC ) 200 MCG tablet Take 4 tablets (800 mcg total) by mouth once for 1 dose. 08/18/19 08/18/19  Nugent, Merilee Stanley, NP  promethazine  (PHENERGAN ) 12.5 MG tablet Take 1 tablet (12.5 mg total) by mouth every 6 (six) hours as needed for nausea or vomiting. Patient not taking: Reported on 09/01/2019 08/18/19   Nugent, Merilee Stanley, NP      Allergies    Patient has no known allergies.    Review of Systems   Review of Systems  All other systems reviewed and are negative.   Physical Exam Updated  Vital Signs BP (!) 140/88   Pulse 78   Temp 98.3 F (36.8 C)   Resp 17   Ht 5\' 2"  (1.575 m)   Wt 98.9 kg   LMP 10/31/2023   SpO2 100%   BMI 39.87 kg/m  Physical Exam Vitals and nursing note reviewed.  Constitutional:      Appearance: She is well-developed.  HENT:     Head: Normocephalic.  Cardiovascular:     Rate and Rhythm: Normal rate.  Pulmonary:     Effort: Pulmonary effort is normal.  Abdominal:     General: There is no distension.  Genitourinary:    Comments: Small hemorrhoid,  White clumpy discharge vaginal area  Musculoskeletal:        General: Normal range of motion.     Cervical  back: Normal range of motion.  Skin:    General: Skin is warm.  Neurological:     Mental Status: She is alert and oriented to person, place, and time.  Psychiatric:        Mood and Affect: Mood normal.     ED Results / Procedures / Treatments   Labs (all labs ordered are listed, but only abnormal results are displayed) Labs Reviewed  COMPREHENSIVE METABOLIC PANEL WITH GFR - Abnormal; Notable for the following components:      Result Value   Glucose, Bld 278 (*)    Calcium  8.5 (*)    Albumin 3.3 (*)    All other components within normal limits  URINALYSIS, W/ REFLEX TO CULTURE (INFECTION SUSPECTED) - Abnormal; Notable for the following components:   APPearance HAZY (*)    Specific Gravity, Urine 1.038 (*)    Glucose, UA >=500 (*)    Ketones, ur 5 (*)    Leukocytes,Ua TRACE (*)    Bacteria, UA RARE (*)    All other components within normal limits  CBG MONITORING, ED - Abnormal; Notable for the following components:   Glucose-Capillary 280 (*)    All other components within normal limits  CBC WITH DIFFERENTIAL/PLATELET    EKG None  Radiology No results found.  Procedures Procedures    Medications Ordered in ED Medications  sodium chloride  0.9 % bolus 1,000 mL (0 mLs Intravenous Stopped 11/26/23 1130)  acetaminophen  (TYLENOL ) tablet 1,000 mg (1,000 mg Oral Given 11/26/23 8295)    ED Course/ Medical Decision Making/ A&P                                 Medical Decision Making Patient complains of feeling like her glucose is high.  Patient has had elevated glucose for the past 10 years.  She reports she is unable to tolerate metformin   Amount and/or Complexity of Data Reviewed Labs: ordered. Decision-making details documented in ED Course.    Details: Labs ordered reviewed and interpreted glucose is 278.  UA shows rare bacteria.  Risk OTC drugs. Prescription drug management. Risk Details: Patient given IV fluids x 1 L.  I discussed patient's elevated glucose  with her.  I will start her on Glucotrol .  Patient is given a prescription for glucose monitor.  She is advised to schedule to see primary care for recheck.  Patient is given a prinif they prescription for Diflucan .  She is advised to return to the emergency department for any problems.           Final Clinical Impression(s) / ED Diagnoses Final diagnoses:  Hyperglycemia  Type 2 diabetes mellitus without complication, without long-term current use of insulin  (HCC)  Yeast vaginitis    Rx / DC Orders ED Discharge Orders          Ordered    glipiZIDE  (GLUCOTROL ) 10 MG tablet  Daily        11/26/23 1127    Blood Glucose Monitoring Suppl DEVI  3 times daily        11/26/23 1127    Glucose Blood (BLOOD GLUCOSE TEST STRIPS) STRP  3 times daily        11/26/23 1127    Lancet Device MISC  3 times daily        11/26/23 1127    Lancets Misc. MISC  3 times daily        11/26/23 1127    fluconazole  (DIFLUCAN ) 150 MG tablet  Daily        11/26/23 1127          An After Visit Summary was printed and given to the patient.     Sandi Crosby, [place] 11/26/23 1437    Sueellen Emery, MD 11/26/23 1451

## 2023-11-26 NOTE — Discharge Instructions (Addendum)
 Schedule to see Primary care for evaluation

## 2023-12-24 ENCOUNTER — Telehealth: Admitting: Family Medicine

## 2023-12-24 DIAGNOSIS — K529 Noninfective gastroenteritis and colitis, unspecified: Secondary | ICD-10-CM | POA: Diagnosis not present

## 2023-12-24 MED ORDER — ONDANSETRON 4 MG PO TBDP: 4.0000 mg | ORAL_TABLET | Freq: Three times a day (TID) | ORAL | 0 refills | Status: AC | PRN

## 2023-12-24 NOTE — Patient Instructions (Signed)
 Marybeth Mace, thank you for joining Chiquita CHRISTELLA Barefoot, NP for today's virtual visit.  While this provider is not your primary care provider (PCP), if your PCP is located in our provider database this encounter information will be shared with them immediately following your visit.   A [facility] MyChart account gives you access to today's visit and all your visits, tests, and labs performed at [facility]  click here if you don't have a  MyChart account or go to [email or web address]  Consent: (Patient) Felicia Holt provided verbal consent for this virtual visit at the beginning of the encounter.  Current Medications:  Current Outpatient Medications:    ondansetron  (ZOFRAN -ODT) 4 MG disintegrating tablet, Take 1 tablet (4 mg total) by mouth every 8 (eight) hours as needed for nausea or vomiting., Disp: 20 tablet, Rfl: 0   benzonatate  (TESSALON ) 100 MG capsule, Take 1 capsule (100 mg total) by mouth every 8 (eight) hours., Disp: 21 capsule, Rfl: 0   Blood Glucose Monitoring Suppl DEVI, 1 each by Does not apply route in the morning, at noon, and at bedtime. May substitute to any manufacturer covered by patient's insurance., Disp: 1 each, Rfl: 0   fluconazole  (DIFLUCAN ) 150 MG tablet, Take 1 tablet (150 mg total) by mouth daily., Disp: 1 tablet, Rfl: 1   glipiZIDE  (GLUCOTROL ) 10 MG tablet, Take 1 tablet (10 mg total) by mouth daily., Disp: 30 tablet, Rfl: 11   Glucose Blood (BLOOD GLUCOSE TEST STRIPS) STRP, 1 each by In Vitro route in the morning, at noon, and at bedtime. May substitute to any manufacturer covered by patient's insurance., Disp: 100 strip, Rfl: 0   hydrocortisone  cream 1 %, Apply 1 Application topically 2 (two) times daily as needed for itching., Disp: 20 g, Rfl: 0   ibuprofen  (ADVIL ) 600 MG tablet, Take 1 tablet (600 mg total) by mouth every 6 (six) hours as needed for up to 30 doses for moderate pain or cramping. (Patient not taking: Reported on 09/01/2019),  Disp: 30 tablet, Rfl: 0   Lancet Device MISC, 1 each by Does not apply route in the morning, at noon, and at bedtime. May substitute to any manufacturer covered by patient's insurance., Disp: 1 each, Rfl: 0   Lancets Misc. MISC, 1 each by Does not apply route in the morning, at noon, and at bedtime. May substitute to any manufacturer covered by patient's insurance., Disp: 100 each, Rfl: 0   misoprostol  (CYTOTEC ) 200 MCG tablet, Take 4 tablets (800 mcg total) by mouth once for 1 dose., Disp: 4 tablet, Rfl: 0   promethazine  (PHENERGAN ) 12.5 MG tablet, Take 1 tablet (12.5 mg total) by mouth every 6 (six) hours as needed for nausea or vomiting. (Patient not taking: Reported on 09/01/2019), Disp: 30 tablet, Rfl: 0   Medications ordered in this encounter:  Meds ordered this encounter  Medications   ondansetron  (ZOFRAN -ODT) 4 MG disintegrating tablet    Sig: Take 1 tablet (4 mg total) by mouth every 8 (eight) hours as needed for nausea or vomiting.    Dispense:  20 tablet    Refill:  0    Supervising Provider:   LAMPTEY, PHILIP O [[id number]]     *If you need refills on other medications prior to your next appointment, please contact your pharmacy*  Follow-Up: Call back or seek an in-person evaluation if the symptoms worsen or if the condition fails to improve as anticipated.  [facility] [phone]  Other Instructions   -hydrate -use  zofran  and or imodium for relief of symptoms -be seen in person if not improving -please use tylenol  as directed on the bottle, it is dangerous to over take it -please start taking glipizide  as directed once you are feeling better.      If you have been instructed to have an in-person evaluation today at a local Urgent Care facility, please use the link below. It will take you to a list of all of our available [facility], including address, phone number and hours of operation. Please do not delay care.  [facility]  If you or a family member do not have a primary care provider, use the link below to schedule a visit and establish care. When you choose a [facility] primary care physician or advanced practice provider, you gain a long-term partner in health. Find a Primary Care Provider  Learn more about [facility]'s in-office and virtual care options: [facility] - Get Care Now

## 2023-12-24 NOTE — Progress Notes (Signed)
 Virtual Visit Consent   Felicia Holt, you are scheduled for a virtual visit with a [facility] provider today. Just as with appointments in the office, your consent must be obtained to participate. Your consent will be active for this visit and any virtual visit you may have with one of our providers in the next 365 days. If you have a MyChart account, a copy of this consent can be sent to you electronically.  As this is a virtual visit, video technology does not allow for your provider to perform a traditional examination. This may limit your provider's ability to fully assess your condition. If your provider identifies any concerns that need to be evaluated in person or the need to arrange testing (such as labs, EKG, etc.), we will make arrangements to do so. Although advances in technology are sophisticated, we cannot ensure that it will always work on either your end or our end. If the connection with a video visit is poor, the visit may have to be switched to a telephone visit. With either a video or telephone visit, we are not always able to ensure that we have a secure connection.  By engaging in this virtual visit, you consent to the provision of healthcare and authorize for your insurance to be billed (if applicable) for the services provided during this visit. Depending on your insurance coverage, you may receive a charge related to this service.  I need to obtain your verbal consent now. Are you willing to proceed with your visit today? Felicia Holt has provided verbal consent on 12/24/2023 for a virtual visit (video or telephone). Chiquita CHRISTELLA Barefoot, NP  Date: 12/24/2023 11:00 AM   Virtual Visit via Video Note   I, Chiquita CHRISTELLA Barefoot, connected with  Felicia Holt  ([id number], 1996-01-08) on 12/24/23 at 11:00 AM EDT by a video-enabled telemedicine application and verified that I am speaking with the correct person using two identifiers.  Location: Patient: Virtual Visit Location Patient:  Home Provider: Virtual Visit Location Provider: Home Office   I discussed the limitations of evaluation and management by telemedicine and the availability of in person appointments. The patient expressed understanding and agreed to proceed.    History of Present Illness: Felicia Holt is a 28 y.o. who identifies as a female who was assigned female at birth, and is being seen today for stomach upset.  Onset was was yesterday while at work Just started glipizide  in the last month, but not taking daily.   Associated symptoms are diarrhea and nausea and one episode of vomiting.  Feels hot- but not feverish. Has not been able to eat or drink anything since yesterday.  Modifying factors are nothing- but has used tylenol  for stomach pain (reports taking it up to 6 at a time) Denies chest pain, shortness of breath, fevers, chills   Problems:  Patient Active Problem List   Diagnosis Date Noted   SAB (spontaneous abortion) 09/01/2019   Adjustment disorder with depressed mood 10/03/2016   Hypoxic encephalopathy (HCC) 02/01/2015   Memory loss 02/01/2015   Acute respiratory failure with hypoxia (HCC)    Acute pulmonary edema (HCC)    Acute pyelonephritis    Diabetes type 2, controlled (HCC)    Encephalopathy acute 01/04/2015   ARDS (adult respiratory distress syndrome) (HCC) 12/28/2014   Severe sepsis (HCC) 12/27/2014   Hypotension 12/26/2014   AKI (acute kidney injury) (HCC) 12/26/2014   Depression 11/25/2013    Allergies: No Known Allergies Medications:  Current Outpatient Medications:  benzonatate  (TESSALON ) 100 MG capsule, Take 1 capsule (100 mg total) by mouth every 8 (eight) hours., Disp: 21 capsule, Rfl: 0   Blood Glucose Monitoring Suppl DEVI, 1 each by Does not apply route in the morning, at noon, and at bedtime. May substitute to any manufacturer covered by patient's insurance., Disp: 1 each, Rfl: 0   fluconazole  (DIFLUCAN ) 150 MG tablet, Take 1 tablet (150 mg total) by mouth  daily., Disp: 1 tablet, Rfl: 1   glipiZIDE  (GLUCOTROL ) 10 MG tablet, Take 1 tablet (10 mg total) by mouth daily., Disp: 30 tablet, Rfl: 11   Glucose Blood (BLOOD GLUCOSE TEST STRIPS) STRP, 1 each by In Vitro route in the morning, at noon, and at bedtime. May substitute to any manufacturer covered by patient's insurance., Disp: 100 strip, Rfl: 0   hydrocortisone  cream 1 %, Apply 1 Application topically 2 (two) times daily as needed for itching., Disp: 20 g, Rfl: 0   ibuprofen  (ADVIL ) 600 MG tablet, Take 1 tablet (600 mg total) by mouth every 6 (six) hours as needed for up to 30 doses for moderate pain or cramping. (Patient not taking: Reported on 09/01/2019), Disp: 30 tablet, Rfl: 0   Lancet Device MISC, 1 each by Does not apply route in the morning, at noon, and at bedtime. May substitute to any manufacturer covered by patient's insurance., Disp: 1 each, Rfl: 0   Lancets Misc. MISC, 1 each by Does not apply route in the morning, at noon, and at bedtime. May substitute to any manufacturer covered by patient's insurance., Disp: 100 each, Rfl: 0   misoprostol  (CYTOTEC ) 200 MCG tablet, Take 4 tablets (800 mcg total) by mouth once for 1 dose., Disp: 4 tablet, Rfl: 0   promethazine  (PHENERGAN ) 12.5 MG tablet, Take 1 tablet (12.5 mg total) by mouth every 6 (six) hours as needed for nausea or vomiting. (Patient not taking: Reported on 09/01/2019), Disp: 30 tablet, Rfl: 0  Observations/Objective: Patient is well-developed, well-nourished in no acute distress.  Resting comfortably at home.  Head is normocephalic, atraumatic.  No labored breathing. Speech is clear and coherent with logical content.  Patient is alert and oriented at baseline.    Assessment and Plan:  1. Gastroenteritis (Primary)  - ondansetron  (ZOFRAN -ODT) 4 MG disintegrating tablet; Take 1 tablet (4 mg total) by mouth every 8 (eight) hours as needed for nausea or vomiting.  Dispense: 20 tablet; Refill: 0   -hydrate -use zofran  and or  imodium for relief of symptoms -be seen in person if not improving -please use tylenol  as directed on the bottle, it is dangerous to over take it -please start taking glipizide  as directed once you are feeling better.    Reviewed side effects, risks and benefits of medication.    Patient acknowledged agreement and understanding of the plan.   Past Medical, Surgical, Social History, Allergies, and Medications have been Reviewed.   Follow Up Instructions: I discussed the assessment and treatment plan with the patient. The patient was provided an opportunity to ask questions and all were answered. The patient agreed with the plan and demonstrated an understanding of the instructions.  A copy of instructions were sent to the patient via MyChart unless otherwise noted below.    The patient was advised to call back or seek an in-person evaluation if the symptoms worsen or if the condition fails to improve as anticipated.    Chiquita CHRISTELLA Barefoot, NP
